# Patient Record
Sex: Female | Born: 1945 | Race: White | Hispanic: No | Smoking: Never smoker
Health system: Southern US, Community
[De-identification: ages and names within clinical notes are randomized; demographics above are authoritative.]

## PROBLEM LIST (undated history)

## (undated) DIAGNOSIS — C50919 Malignant neoplasm of unspecified site of unspecified female breast: Secondary | ICD-10-CM

## (undated) DIAGNOSIS — R112 Nausea with vomiting, unspecified: Secondary | ICD-10-CM

## (undated) DIAGNOSIS — F988 Other specified behavioral and emotional disorders with onset usually occurring in childhood and adolescence: Secondary | ICD-10-CM

## (undated) DIAGNOSIS — R296 Repeated falls: Secondary | ICD-10-CM

## (undated) DIAGNOSIS — K589 Irritable bowel syndrome without diarrhea: Secondary | ICD-10-CM

## (undated) DIAGNOSIS — Z923 Personal history of irradiation: Secondary | ICD-10-CM

## (undated) DIAGNOSIS — Z800 Family history of malignant neoplasm of digestive organs: Secondary | ICD-10-CM

## (undated) DIAGNOSIS — R51 Headache: Secondary | ICD-10-CM

## (undated) DIAGNOSIS — T7840XA Allergy, unspecified, initial encounter: Secondary | ICD-10-CM

## (undated) DIAGNOSIS — R519 Headache, unspecified: Secondary | ICD-10-CM

## (undated) DIAGNOSIS — K311 Adult hypertrophic pyloric stenosis: Secondary | ICD-10-CM

## (undated) DIAGNOSIS — M549 Dorsalgia, unspecified: Secondary | ICD-10-CM

## (undated) DIAGNOSIS — D649 Anemia, unspecified: Secondary | ICD-10-CM

## (undated) DIAGNOSIS — M8580 Other specified disorders of bone density and structure, unspecified site: Secondary | ICD-10-CM

## (undated) DIAGNOSIS — F32A Depression, unspecified: Secondary | ICD-10-CM

## (undated) DIAGNOSIS — R011 Cardiac murmur, unspecified: Secondary | ICD-10-CM

## (undated) DIAGNOSIS — Z9289 Personal history of other medical treatment: Secondary | ICD-10-CM

## (undated) DIAGNOSIS — F329 Major depressive disorder, single episode, unspecified: Secondary | ICD-10-CM

## (undated) DIAGNOSIS — Z8639 Personal history of other endocrine, nutritional and metabolic disease: Secondary | ICD-10-CM

## (undated) DIAGNOSIS — N189 Chronic kidney disease, unspecified: Secondary | ICD-10-CM

## (undated) DIAGNOSIS — C50519 Malignant neoplasm of lower-outer quadrant of unspecified female breast: Secondary | ICD-10-CM

## (undated) DIAGNOSIS — I10 Essential (primary) hypertension: Secondary | ICD-10-CM

## (undated) DIAGNOSIS — N83209 Unspecified ovarian cyst, unspecified side: Secondary | ICD-10-CM

## (undated) DIAGNOSIS — F419 Anxiety disorder, unspecified: Secondary | ICD-10-CM

## (undated) DIAGNOSIS — K5730 Diverticulosis of large intestine without perforation or abscess without bleeding: Secondary | ICD-10-CM

## (undated) DIAGNOSIS — K219 Gastro-esophageal reflux disease without esophagitis: Secondary | ICD-10-CM

## (undated) DIAGNOSIS — IMO0001 Reserved for inherently not codable concepts without codable children: Secondary | ICD-10-CM

## (undated) DIAGNOSIS — E7800 Pure hypercholesterolemia, unspecified: Secondary | ICD-10-CM

## (undated) DIAGNOSIS — Z9889 Other specified postprocedural states: Secondary | ICD-10-CM

## (undated) HISTORY — DX: Gastro-esophageal reflux disease without esophagitis: K21.9

## (undated) HISTORY — PX: WISDOM TOOTH EXTRACTION: SHX21

## (undated) HISTORY — DX: Anemia, unspecified: D64.9

## (undated) HISTORY — DX: Pure hypercholesterolemia, unspecified: E78.00

## (undated) HISTORY — PX: UPPER GASTROINTESTINAL ENDOSCOPY: SHX188

## (undated) HISTORY — DX: Essential (primary) hypertension: I10

## (undated) HISTORY — DX: Other specified behavioral and emotional disorders with onset usually occurring in childhood and adolescence: F98.8

## (undated) HISTORY — DX: Other specified disorders of bone density and structure, unspecified site: M85.80

## (undated) HISTORY — DX: Malignant neoplasm of unspecified site of unspecified female breast: C50.919

## (undated) HISTORY — PX: APPENDECTOMY: SHX54

## (undated) HISTORY — PX: HERNIA REPAIR: SHX51

## (undated) HISTORY — DX: Diverticulosis of large intestine without perforation or abscess without bleeding: K57.30

## (undated) HISTORY — DX: Depression, unspecified: F32.A

## (undated) HISTORY — DX: Anxiety disorder, unspecified: F41.9

## (undated) HISTORY — DX: Dorsalgia, unspecified: M54.9

## (undated) HISTORY — PX: CATARACT EXTRACTION: SUR2

## (undated) HISTORY — DX: Irritable bowel syndrome, unspecified: K58.9

## (undated) HISTORY — PX: COLONOSCOPY: SHX174

## (undated) HISTORY — DX: Malignant neoplasm of lower-outer quadrant of unspecified female breast: C50.519

## (undated) HISTORY — PX: UTERINE FIBROID SURGERY: SHX826

## (undated) HISTORY — PX: ESOPHAGOGASTRODUODENOSCOPY: SHX1529

## (undated) HISTORY — DX: Major depressive disorder, single episode, unspecified: F32.9

## (undated) HISTORY — DX: Allergy, unspecified, initial encounter: T78.40XA

## (undated) HISTORY — DX: Family history of malignant neoplasm of digestive organs: Z80.0

---

## 2004-12-07 ENCOUNTER — Ambulatory Visit: Payer: Self-pay | Admitting: Gastroenterology

## 2005-04-26 ENCOUNTER — Ambulatory Visit: Admission: RE | Admit: 2005-04-26 | Discharge: 2005-04-26 | Payer: Self-pay | Admitting: Family Medicine

## 2005-04-26 ENCOUNTER — Encounter: Payer: Self-pay | Admitting: Cardiology

## 2005-04-26 ENCOUNTER — Ambulatory Visit: Payer: Self-pay | Admitting: Cardiology

## 2005-11-25 DIAGNOSIS — C50919 Malignant neoplasm of unspecified site of unspecified female breast: Secondary | ICD-10-CM

## 2005-11-25 HISTORY — PX: OTHER SURGICAL HISTORY: SHX169

## 2005-11-25 HISTORY — DX: Malignant neoplasm of unspecified site of unspecified female breast: C50.919

## 2005-11-25 HISTORY — PX: BREAST LUMPECTOMY: SHX2

## 2006-01-31 ENCOUNTER — Encounter (INDEPENDENT_AMBULATORY_CARE_PROVIDER_SITE_OTHER): Payer: Self-pay | Admitting: *Deleted

## 2006-01-31 ENCOUNTER — Inpatient Hospital Stay (HOSPITAL_COMMUNITY): Admission: EM | Admit: 2006-01-31 | Discharge: 2006-02-13 | Payer: Self-pay | Admitting: Emergency Medicine

## 2006-04-07 ENCOUNTER — Encounter: Admission: RE | Admit: 2006-04-07 | Discharge: 2006-04-07 | Payer: Self-pay | Admitting: Surgery

## 2006-10-21 ENCOUNTER — Encounter: Admission: RE | Admit: 2006-10-21 | Discharge: 2006-10-21 | Payer: Self-pay | Admitting: Obstetrics and Gynecology

## 2006-10-21 ENCOUNTER — Encounter (INDEPENDENT_AMBULATORY_CARE_PROVIDER_SITE_OTHER): Payer: Self-pay | Admitting: Radiology

## 2006-10-31 ENCOUNTER — Encounter: Admission: RE | Admit: 2006-10-31 | Discharge: 2006-10-31 | Payer: Self-pay | Admitting: Obstetrics and Gynecology

## 2006-11-03 ENCOUNTER — Encounter: Admission: RE | Admit: 2006-11-03 | Discharge: 2006-11-03 | Payer: Self-pay | Admitting: General Surgery

## 2006-11-04 ENCOUNTER — Ambulatory Visit (HOSPITAL_BASED_OUTPATIENT_CLINIC_OR_DEPARTMENT_OTHER): Admission: RE | Admit: 2006-11-04 | Discharge: 2006-11-04 | Payer: Self-pay | Admitting: General Surgery

## 2006-11-04 ENCOUNTER — Encounter (INDEPENDENT_AMBULATORY_CARE_PROVIDER_SITE_OTHER): Payer: Self-pay | Admitting: Specialist

## 2006-11-04 ENCOUNTER — Encounter: Admission: RE | Admit: 2006-11-04 | Discharge: 2006-11-04 | Payer: Self-pay | Admitting: General Surgery

## 2006-11-05 ENCOUNTER — Ambulatory Visit: Payer: Self-pay | Admitting: Oncology

## 2006-11-25 DIAGNOSIS — Z923 Personal history of irradiation: Secondary | ICD-10-CM

## 2006-11-25 HISTORY — DX: Personal history of irradiation: Z92.3

## 2006-11-26 LAB — CANCER ANTIGEN 27.29: CA 27.29: 36 U/mL (ref 0–39)

## 2006-11-26 LAB — CBC WITH DIFFERENTIAL/PLATELET
BASO%: 1 % (ref 0.0–2.0)
Basophils Absolute: 0.1 10*3/uL (ref 0.0–0.1)
EOS%: 6.6 % (ref 0.0–7.0)
Eosinophils Absolute: 0.6 10*3/uL — ABNORMAL HIGH (ref 0.0–0.5)
HCT: 34.5 % — ABNORMAL LOW (ref 34.8–46.6)
HGB: 11.7 g/dL (ref 11.6–15.9)
LYMPH%: 25.8 % (ref 14.0–48.0)
MCH: 29.5 pg (ref 26.0–34.0)
MCHC: 33.9 g/dL (ref 32.0–36.0)
MCV: 87 fL (ref 81.0–101.0)
MONO#: 0.7 10*3/uL (ref 0.1–0.9)
MONO%: 7.5 % (ref 0.0–13.0)
NEUT#: 5.3 10*3/uL (ref 1.5–6.5)
NEUT%: 59.1 % (ref 39.6–76.8)
Platelets: 305 10*3/uL (ref 145–400)
RBC: 3.96 10*6/uL (ref 3.70–5.32)
RDW: 12.5 % (ref 11.3–14.5)
WBC: 9 10*3/uL (ref 3.9–10.0)
lymph#: 2.3 10*3/uL (ref 0.9–3.3)

## 2006-11-26 LAB — COMPREHENSIVE METABOLIC PANEL
ALT: 17 U/L (ref 0–35)
AST: 14 U/L (ref 0–37)
Albumin: 4.8 g/dL (ref 3.5–5.2)
Alkaline Phosphatase: 109 U/L (ref 39–117)
BUN: 32 mg/dL — ABNORMAL HIGH (ref 6–23)
CO2: 26 mEq/L (ref 19–32)
Calcium: 9.9 mg/dL (ref 8.4–10.5)
Chloride: 100 mEq/L (ref 96–112)
Creatinine, Ser: 1.1 mg/dL (ref 0.40–1.20)
Glucose, Bld: 102 mg/dL — ABNORMAL HIGH (ref 70–99)
Potassium: 4.6 mEq/L (ref 3.5–5.3)
Sodium: 138 mEq/L (ref 135–145)
Total Bilirubin: 0.2 mg/dL — ABNORMAL LOW (ref 0.3–1.2)
Total Protein: 7.2 g/dL (ref 6.0–8.3)

## 2006-11-26 LAB — LACTATE DEHYDROGENASE: LDH: 163 U/L (ref 94–250)

## 2006-12-03 ENCOUNTER — Ambulatory Visit (HOSPITAL_COMMUNITY): Admission: RE | Admit: 2006-12-03 | Discharge: 2006-12-03 | Payer: Self-pay | Admitting: Oncology

## 2006-12-08 ENCOUNTER — Encounter: Admission: RE | Admit: 2006-12-08 | Discharge: 2006-12-08 | Payer: Self-pay | Admitting: Oncology

## 2006-12-15 ENCOUNTER — Ambulatory Visit: Payer: Self-pay | Admitting: Oncology

## 2006-12-15 LAB — RESEARCH LABS

## 2006-12-19 ENCOUNTER — Ambulatory Visit: Admission: RE | Admit: 2006-12-19 | Discharge: 2007-03-03 | Payer: Self-pay | Admitting: Radiation Oncology

## 2007-01-05 ENCOUNTER — Other Ambulatory Visit: Admission: RE | Admit: 2007-01-05 | Discharge: 2007-01-05 | Payer: Self-pay | Admitting: Family Medicine

## 2007-02-18 ENCOUNTER — Ambulatory Visit: Payer: Self-pay | Admitting: Oncology

## 2007-02-18 LAB — CBC WITH DIFFERENTIAL/PLATELET
BASO%: 0.9 % (ref 0.0–2.0)
Basophils Absolute: 0.1 10*3/uL (ref 0.0–0.1)
EOS%: 1.8 % (ref 0.0–7.0)
Eosinophils Absolute: 0.1 10*3/uL (ref 0.0–0.5)
HCT: 33.5 % — ABNORMAL LOW (ref 34.8–46.6)
HGB: 11.5 g/dL — ABNORMAL LOW (ref 11.6–15.9)
LYMPH%: 16.9 % (ref 14.0–48.0)
MCH: 30.1 pg (ref 26.0–34.0)
MCHC: 34.4 g/dL (ref 32.0–36.0)
MCV: 87.7 fL (ref 81.0–101.0)
MONO#: 0.5 10*3/uL (ref 0.1–0.9)
MONO%: 7.3 % (ref 0.0–13.0)
NEUT#: 5.5 10*3/uL (ref 1.5–6.5)
NEUT%: 73.1 % (ref 39.6–76.8)
Platelets: 310 10*3/uL (ref 145–400)
RBC: 3.82 10*6/uL (ref 3.70–5.32)
RDW: 12.6 % (ref 11.3–14.5)
WBC: 7.5 10*3/uL (ref 3.9–10.0)
lymph#: 1.3 10*3/uL (ref 0.9–3.3)

## 2007-02-18 LAB — COMPREHENSIVE METABOLIC PANEL
ALT: 19 U/L (ref 0–35)
AST: 19 U/L (ref 0–37)
Albumin: 4.6 g/dL (ref 3.5–5.2)
Alkaline Phosphatase: 123 U/L — ABNORMAL HIGH (ref 39–117)
BUN: 22 mg/dL (ref 6–23)
CO2: 27 mEq/L (ref 19–32)
Calcium: 9.5 mg/dL (ref 8.4–10.5)
Chloride: 102 mEq/L (ref 96–112)
Creatinine, Ser: 0.85 mg/dL (ref 0.40–1.20)
Glucose, Bld: 99 mg/dL (ref 70–99)
Potassium: 4.4 mEq/L (ref 3.5–5.3)
Sodium: 140 mEq/L (ref 135–145)
Total Bilirubin: 0.3 mg/dL (ref 0.3–1.2)
Total Protein: 7.4 g/dL (ref 6.0–8.3)

## 2007-02-18 LAB — LACTATE DEHYDROGENASE: LDH: 155 U/L (ref 94–250)

## 2007-04-13 ENCOUNTER — Ambulatory Visit: Payer: Self-pay | Admitting: Oncology

## 2007-04-15 LAB — COMPREHENSIVE METABOLIC PANEL
ALT: 20 U/L (ref 0–35)
AST: 21 U/L (ref 0–37)
Albumin: 4.9 g/dL (ref 3.5–5.2)
Alkaline Phosphatase: 116 U/L (ref 39–117)
BUN: 21 mg/dL (ref 6–23)
CO2: 24 mEq/L (ref 19–32)
Calcium: 10.3 mg/dL (ref 8.4–10.5)
Chloride: 102 mEq/L (ref 96–112)
Creatinine, Ser: 0.98 mg/dL (ref 0.40–1.20)
Glucose, Bld: 119 mg/dL — ABNORMAL HIGH (ref 70–99)
Potassium: 3.9 mEq/L (ref 3.5–5.3)
Sodium: 140 mEq/L (ref 135–145)
Total Bilirubin: 0.3 mg/dL (ref 0.3–1.2)
Total Protein: 7.8 g/dL (ref 6.0–8.3)

## 2007-04-15 LAB — CBC WITH DIFFERENTIAL/PLATELET
BASO%: 0.2 % (ref 0.0–2.0)
Basophils Absolute: 0 10*3/uL (ref 0.0–0.1)
EOS%: 1.3 % (ref 0.0–7.0)
Eosinophils Absolute: 0.1 10*3/uL (ref 0.0–0.5)
HCT: 34.7 % — ABNORMAL LOW (ref 34.8–46.6)
HGB: 12.2 g/dL (ref 11.6–15.9)
LYMPH%: 14.8 % (ref 14.0–48.0)
MCH: 30.5 pg (ref 26.0–34.0)
MCHC: 35.2 g/dL (ref 32.0–36.0)
MCV: 86.8 fL (ref 81.0–101.0)
MONO#: 0.6 10*3/uL (ref 0.1–0.9)
MONO%: 5.6 % (ref 0.0–13.0)
NEUT#: 8 10*3/uL — ABNORMAL HIGH (ref 1.5–6.5)
NEUT%: 78.1 % — ABNORMAL HIGH (ref 39.6–76.8)
Platelets: 285 10*3/uL (ref 145–400)
RBC: 4 10*6/uL (ref 3.70–5.32)
RDW: 13.2 % (ref 11.3–14.5)
WBC: 10.2 10*3/uL — ABNORMAL HIGH (ref 3.9–10.0)
lymph#: 1.5 10*3/uL (ref 0.9–3.3)

## 2007-04-15 LAB — LACTATE DEHYDROGENASE: LDH: 153 U/L (ref 94–250)

## 2007-05-18 ENCOUNTER — Encounter: Admission: RE | Admit: 2007-05-18 | Discharge: 2007-05-18 | Payer: Self-pay | Admitting: General Surgery

## 2007-08-01 ENCOUNTER — Inpatient Hospital Stay (HOSPITAL_COMMUNITY): Admission: RE | Admit: 2007-08-01 | Discharge: 2007-08-02 | Payer: Self-pay | Admitting: Surgery

## 2007-08-03 ENCOUNTER — Ambulatory Visit: Payer: Self-pay | Admitting: Oncology

## 2007-08-07 LAB — CBC WITH DIFFERENTIAL/PLATELET
BASO%: 0.4 % (ref 0.0–2.0)
Basophils Absolute: 0 10*3/uL (ref 0.0–0.1)
EOS%: 5.5 % (ref 0.0–7.0)
Eosinophils Absolute: 0.5 10*3/uL (ref 0.0–0.5)
HCT: 30.4 % — ABNORMAL LOW (ref 34.8–46.6)
HGB: 10.7 g/dL — ABNORMAL LOW (ref 11.6–15.9)
LYMPH%: 14.3 % (ref 14.0–48.0)
MCH: 30.4 pg (ref 26.0–34.0)
MCHC: 35.3 g/dL (ref 32.0–36.0)
MCV: 86.1 fL (ref 81.0–101.0)
MONO#: 0.6 10*3/uL (ref 0.1–0.9)
MONO%: 6.2 % (ref 0.0–13.0)
NEUT#: 6.8 10*3/uL — ABNORMAL HIGH (ref 1.5–6.5)
NEUT%: 73.6 % (ref 39.6–76.8)
Platelets: 314 10*3/uL (ref 145–400)
RBC: 3.53 10*6/uL — ABNORMAL LOW (ref 3.70–5.32)
RDW: 12.8 % (ref 11.3–14.5)
WBC: 9.2 10*3/uL (ref 3.9–10.0)
lymph#: 1.3 10*3/uL (ref 0.9–3.3)

## 2007-08-07 LAB — COMPREHENSIVE METABOLIC PANEL
ALT: 22 U/L (ref 0–35)
AST: 20 U/L (ref 0–37)
Albumin: 4.1 g/dL (ref 3.5–5.2)
Alkaline Phosphatase: 113 U/L (ref 39–117)
BUN: 17 mg/dL (ref 6–23)
CO2: 22 mEq/L (ref 19–32)
Calcium: 9.3 mg/dL (ref 8.4–10.5)
Chloride: 101 mEq/L (ref 96–112)
Creatinine, Ser: 0.84 mg/dL (ref 0.40–1.20)
Glucose, Bld: 130 mg/dL — ABNORMAL HIGH (ref 70–99)
Potassium: 4 mEq/L (ref 3.5–5.3)
Sodium: 138 mEq/L (ref 135–145)
Total Bilirubin: 0.2 mg/dL — ABNORMAL LOW (ref 0.3–1.2)
Total Protein: 7 g/dL (ref 6.0–8.3)

## 2007-08-07 LAB — CANCER ANTIGEN 27.29: CA 27.29: 30 U/mL (ref 0–39)

## 2007-08-07 LAB — LACTATE DEHYDROGENASE: LDH: 178 U/L (ref 94–250)

## 2007-09-22 ENCOUNTER — Ambulatory Visit: Payer: Self-pay | Admitting: Oncology

## 2007-09-24 LAB — CBC & DIFF AND RETIC
BASO%: 0.3 % (ref 0.0–2.0)
Basophils Absolute: 0 10*3/uL (ref 0.0–0.1)
EOS%: 3 % (ref 0.0–7.0)
Eosinophils Absolute: 0.2 10*3/uL (ref 0.0–0.5)
HCT: 35.2 % (ref 34.8–46.6)
HGB: 12.5 g/dL (ref 11.6–15.9)
IRF: 0.4 — ABNORMAL HIGH (ref 0.130–0.330)
LYMPH%: 25 % (ref 14.0–48.0)
MCH: 30.3 pg (ref 26.0–34.0)
MCHC: 35.7 g/dL (ref 32.0–36.0)
MCV: 84.8 fL (ref 81.0–101.0)
MONO#: 0.4 10*3/uL (ref 0.1–0.9)
MONO%: 6.2 % (ref 0.0–13.0)
NEUT#: 4.6 10*3/uL (ref 1.5–6.5)
NEUT%: 65.5 % (ref 39.6–76.8)
Platelets: 283 10*3/uL (ref 145–400)
RBC: 4.15 10*6/uL (ref 3.70–5.32)
RDW: 13.4 % (ref 11.3–14.5)
RETIC #: 61.8 10*3/uL (ref 19.7–115.1)
Retic %: 1.5 % (ref 0.4–2.3)
WBC: 7 10*3/uL (ref 3.9–10.0)
lymph#: 1.8 10*3/uL (ref 0.9–3.3)

## 2007-09-24 LAB — COMPREHENSIVE METABOLIC PANEL
ALT: 17 U/L (ref 0–35)
AST: 19 U/L (ref 0–37)
Albumin: 4.7 g/dL (ref 3.5–5.2)
Alkaline Phosphatase: 100 U/L (ref 39–117)
BUN: 17 mg/dL (ref 6–23)
CO2: 21 mEq/L (ref 19–32)
Calcium: 9.6 mg/dL (ref 8.4–10.5)
Chloride: 102 mEq/L (ref 96–112)
Creatinine, Ser: 0.93 mg/dL (ref 0.40–1.20)
Glucose, Bld: 118 mg/dL — ABNORMAL HIGH (ref 70–99)
Potassium: 3.4 mEq/L — ABNORMAL LOW (ref 3.5–5.3)
Sodium: 139 mEq/L (ref 135–145)
Total Bilirubin: 0.3 mg/dL (ref 0.3–1.2)
Total Protein: 7.3 g/dL (ref 6.0–8.3)

## 2007-09-24 LAB — FERRITIN: Ferritin: 71 ng/mL (ref 10–291)

## 2007-09-24 LAB — IRON AND TIBC
%SAT: 19 % — ABNORMAL LOW (ref 20–55)
Iron: 60 ug/dL (ref 42–145)
TIBC: 321 ug/dL (ref 250–470)
UIBC: 261 ug/dL

## 2007-09-24 LAB — MORPHOLOGY
PLT EST: ADEQUATE
RBC Comments: NORMAL

## 2007-09-24 LAB — LACTATE DEHYDROGENASE: LDH: 154 U/L (ref 94–250)

## 2007-09-24 LAB — CHCC SMEAR

## 2007-09-24 LAB — CANCER ANTIGEN 27.29: CA 27.29: 36 U/mL (ref 0–39)

## 2007-10-15 ENCOUNTER — Encounter: Admission: RE | Admit: 2007-10-15 | Discharge: 2007-10-15 | Payer: Self-pay | Admitting: Oncology

## 2007-12-30 ENCOUNTER — Ambulatory Visit: Payer: Self-pay | Admitting: Oncology

## 2008-01-04 LAB — CBC & DIFF AND RETIC
BASO%: 0.3 % (ref 0.0–2.0)
Basophils Absolute: 0 10*3/uL (ref 0.0–0.1)
EOS%: 2.1 % (ref 0.0–7.0)
Eosinophils Absolute: 0.2 10*3/uL (ref 0.0–0.5)
HCT: 38.3 % (ref 34.8–46.6)
HGB: 12.5 g/dL (ref 11.6–15.9)
IRF: 0.43 — ABNORMAL HIGH (ref 0.130–0.330)
LYMPH%: 23.2 % (ref 14.0–48.0)
MCH: 28.5 pg (ref 26.0–34.0)
MCHC: 32.7 g/dL (ref 32.0–36.0)
MCV: 87.1 fL (ref 81.0–101.0)
MONO#: 0.5 10*3/uL (ref 0.1–0.9)
MONO%: 6 % (ref 0.0–13.0)
NEUT#: 5.2 10*3/uL (ref 1.5–6.5)
NEUT%: 68.4 % (ref 39.6–76.8)
Platelets: 298 10*3/uL (ref 145–400)
RBC: 4.4 10*6/uL (ref 3.70–5.32)
RDW: 12.7 % (ref 11.3–14.5)
RETIC #: 72.2 10*3/uL (ref 19.7–115.1)
Retic %: 1.6 % (ref 0.4–2.3)
WBC: 7.5 10*3/uL (ref 3.9–10.0)
lymph#: 1.7 10*3/uL (ref 0.9–3.3)

## 2008-01-04 LAB — MORPHOLOGY
PLT EST: ADEQUATE
RBC Comments: NORMAL

## 2008-01-04 LAB — CHCC SMEAR

## 2008-01-05 LAB — COMPREHENSIVE METABOLIC PANEL
ALT: 20 U/L (ref 0–35)
AST: 20 U/L (ref 0–37)
Albumin: 4.9 g/dL (ref 3.5–5.2)
Alkaline Phosphatase: 94 U/L (ref 39–117)
BUN: 19 mg/dL (ref 6–23)
CO2: 25 mEq/L (ref 19–32)
Calcium: 9.8 mg/dL (ref 8.4–10.5)
Chloride: 101 mEq/L (ref 96–112)
Creatinine, Ser: 1.21 mg/dL — ABNORMAL HIGH (ref 0.40–1.20)
Glucose, Bld: 100 mg/dL — ABNORMAL HIGH (ref 70–99)
Potassium: 4 mEq/L (ref 3.5–5.3)
Sodium: 140 mEq/L (ref 135–145)
Total Bilirubin: 0.3 mg/dL (ref 0.3–1.2)
Total Protein: 7.5 g/dL (ref 6.0–8.3)

## 2008-01-05 LAB — IRON AND TIBC
%SAT: 18 % — ABNORMAL LOW (ref 20–55)
Iron: 58 ug/dL (ref 42–145)
TIBC: 325 ug/dL (ref 250–470)
UIBC: 267 ug/dL

## 2008-01-05 LAB — FERRITIN: Ferritin: 50 ng/mL (ref 10–291)

## 2008-01-05 LAB — LACTATE DEHYDROGENASE: LDH: 162 U/L (ref 94–250)

## 2008-01-05 LAB — CANCER ANTIGEN 27.29: CA 27.29: 25 U/mL (ref 0–39)

## 2008-01-07 ENCOUNTER — Other Ambulatory Visit: Admission: RE | Admit: 2008-01-07 | Discharge: 2008-01-07 | Payer: Self-pay | Admitting: Family Medicine

## 2008-07-04 ENCOUNTER — Encounter: Admission: RE | Admit: 2008-07-04 | Discharge: 2008-07-04 | Payer: Self-pay | Admitting: Oncology

## 2008-07-22 ENCOUNTER — Ambulatory Visit: Payer: Self-pay | Admitting: Oncology

## 2008-07-26 LAB — CBC WITH DIFFERENTIAL/PLATELET
BASO%: 0.5 % (ref 0.0–2.0)
Basophils Absolute: 0 10*3/uL (ref 0.0–0.1)
EOS%: 2.6 % (ref 0.0–7.0)
Eosinophils Absolute: 0.2 10*3/uL (ref 0.0–0.5)
HCT: 33.8 % — ABNORMAL LOW (ref 34.8–46.6)
HGB: 11.6 g/dL (ref 11.6–15.9)
LYMPH%: 28.5 % (ref 14.0–48.0)
MCH: 30.2 pg (ref 26.0–34.0)
MCHC: 34.3 g/dL (ref 32.0–36.0)
MCV: 88 fL (ref 81.0–101.0)
MONO#: 0.4 10*3/uL (ref 0.1–0.9)
MONO%: 7.2 % (ref 0.0–13.0)
NEUT#: 3.6 10*3/uL (ref 1.5–6.5)
NEUT%: 61.2 % (ref 39.6–76.8)
Platelets: 275 10*3/uL (ref 145–400)
RBC: 3.84 10*6/uL (ref 3.70–5.32)
RDW: 13.1 % (ref 11.3–14.5)
WBC: 5.9 10*3/uL (ref 3.9–10.0)
lymph#: 1.7 10*3/uL (ref 0.9–3.3)

## 2008-07-27 LAB — COMPREHENSIVE METABOLIC PANEL
ALT: 18 U/L (ref 0–35)
AST: 18 U/L (ref 0–37)
Albumin: 4.5 g/dL (ref 3.5–5.2)
Alkaline Phosphatase: 73 U/L (ref 39–117)
BUN: 23 mg/dL (ref 6–23)
CO2: 23 mEq/L (ref 19–32)
Calcium: 9.8 mg/dL (ref 8.4–10.5)
Chloride: 101 mEq/L (ref 96–112)
Creatinine, Ser: 0.86 mg/dL (ref 0.40–1.20)
Glucose, Bld: 120 mg/dL — ABNORMAL HIGH (ref 70–99)
Potassium: 4.2 mEq/L (ref 3.5–5.3)
Sodium: 137 mEq/L (ref 135–145)
Total Bilirubin: 0.3 mg/dL (ref 0.3–1.2)
Total Protein: 7.3 g/dL (ref 6.0–8.3)

## 2008-07-27 LAB — CANCER ANTIGEN 27.29: CA 27.29: 40 U/mL — ABNORMAL HIGH (ref 0–39)

## 2008-07-27 LAB — VITAMIN D 25 HYDROXY (VIT D DEFICIENCY, FRACTURES): Vit D, 25-Hydroxy: 27 ng/mL — ABNORMAL LOW (ref 30–89)

## 2008-07-27 LAB — LACTATE DEHYDROGENASE: LDH: 150 U/L (ref 94–250)

## 2008-09-09 ENCOUNTER — Ambulatory Visit: Payer: Self-pay | Admitting: Oncology

## 2008-09-15 LAB — CANCER ANTIGEN 27.29: CA 27.29: 29 U/mL (ref 0–39)

## 2008-10-17 ENCOUNTER — Encounter: Admission: RE | Admit: 2008-10-17 | Discharge: 2008-10-17 | Payer: Self-pay | Admitting: Oncology

## 2008-11-01 ENCOUNTER — Encounter: Admission: RE | Admit: 2008-11-01 | Discharge: 2008-11-01 | Payer: Self-pay | Admitting: Oncology

## 2009-01-23 ENCOUNTER — Ambulatory Visit: Payer: Self-pay | Admitting: Oncology

## 2009-01-25 LAB — CBC WITH DIFFERENTIAL/PLATELET
BASO%: 0.3 % (ref 0.0–2.0)
Basophils Absolute: 0 10*3/uL (ref 0.0–0.1)
EOS%: 1.8 % (ref 0.0–7.0)
Eosinophils Absolute: 0.2 10*3/uL (ref 0.0–0.5)
HCT: 36.9 % (ref 34.8–46.6)
HGB: 12.7 g/dL (ref 11.6–15.9)
LYMPH%: 27.1 % (ref 14.0–49.7)
MCH: 30.6 pg (ref 25.1–34.0)
MCHC: 34.4 g/dL (ref 31.5–36.0)
MCV: 88.8 fL (ref 79.5–101.0)
MONO#: 0.5 10*3/uL (ref 0.1–0.9)
MONO%: 5.7 % (ref 0.0–14.0)
NEUT#: 5.8 10*3/uL (ref 1.5–6.5)
NEUT%: 65.1 % (ref 38.4–76.8)
Platelets: 333 10*3/uL (ref 145–400)
RBC: 4.15 10*6/uL (ref 3.70–5.45)
RDW: 12.6 % (ref 11.2–14.5)
WBC: 8.9 10*3/uL (ref 3.9–10.3)
lymph#: 2.4 10*3/uL (ref 0.9–3.3)

## 2009-01-26 LAB — COMPREHENSIVE METABOLIC PANEL
ALT: 16 U/L (ref 0–35)
AST: 16 U/L (ref 0–37)
Albumin: 4.8 g/dL (ref 3.5–5.2)
Alkaline Phosphatase: 75 U/L (ref 39–117)
BUN: 29 mg/dL — ABNORMAL HIGH (ref 6–23)
CO2: 18 mEq/L — ABNORMAL LOW (ref 19–32)
Calcium: 9.9 mg/dL (ref 8.4–10.5)
Chloride: 105 mEq/L (ref 96–112)
Creatinine, Ser: 1.1 mg/dL (ref 0.40–1.20)
Glucose, Bld: 114 mg/dL — ABNORMAL HIGH (ref 70–99)
Potassium: 4.1 mEq/L (ref 3.5–5.3)
Sodium: 138 mEq/L (ref 135–145)
Total Bilirubin: 0.4 mg/dL (ref 0.3–1.2)
Total Protein: 7.7 g/dL (ref 6.0–8.3)

## 2009-01-26 LAB — VITAMIN D 25 HYDROXY (VIT D DEFICIENCY, FRACTURES): Vit D, 25-Hydroxy: 40 ng/mL (ref 30–89)

## 2009-01-26 LAB — CANCER ANTIGEN 27.29: CA 27.29: 31 U/mL (ref 0–39)

## 2009-01-26 LAB — LACTATE DEHYDROGENASE: LDH: 155 U/L (ref 94–250)

## 2009-05-15 ENCOUNTER — Other Ambulatory Visit: Admission: RE | Admit: 2009-05-15 | Discharge: 2009-05-15 | Payer: Self-pay | Admitting: Family Medicine

## 2009-07-28 ENCOUNTER — Ambulatory Visit: Payer: Self-pay | Admitting: Oncology

## 2009-08-02 LAB — COMPREHENSIVE METABOLIC PANEL
ALT: 21 U/L (ref 0–35)
AST: 23 U/L (ref 0–37)
Albumin: 4.2 g/dL (ref 3.5–5.2)
Alkaline Phosphatase: 60 U/L (ref 39–117)
BUN: 26 mg/dL — ABNORMAL HIGH (ref 6–23)
CO2: 24 mEq/L (ref 19–32)
Calcium: 9.8 mg/dL (ref 8.4–10.5)
Chloride: 106 mEq/L (ref 96–112)
Creatinine, Ser: 1.3 mg/dL — ABNORMAL HIGH (ref 0.40–1.20)
Glucose, Bld: 100 mg/dL — ABNORMAL HIGH (ref 70–99)
Potassium: 2.9 mEq/L — ABNORMAL LOW (ref 3.5–5.3)
Sodium: 139 mEq/L (ref 135–145)
Total Bilirubin: 0.2 mg/dL — ABNORMAL LOW (ref 0.3–1.2)
Total Protein: 7.4 g/dL (ref 6.0–8.3)

## 2009-08-02 LAB — CBC WITH DIFFERENTIAL/PLATELET
BASO%: 0.3 % (ref 0.0–2.0)
Basophils Absolute: 0 10*3/uL (ref 0.0–0.1)
EOS%: 2.3 % (ref 0.0–7.0)
Eosinophils Absolute: 0.2 10*3/uL (ref 0.0–0.5)
HCT: 31.7 % — ABNORMAL LOW (ref 34.8–46.6)
HGB: 11 g/dL — ABNORMAL LOW (ref 11.6–15.9)
LYMPH%: 21.8 % (ref 14.0–49.7)
MCH: 30.9 pg (ref 25.1–34.0)
MCHC: 34.6 g/dL (ref 31.5–36.0)
MCV: 89.2 fL (ref 79.5–101.0)
MONO#: 0.6 10*3/uL (ref 0.1–0.9)
MONO%: 6.4 % (ref 0.0–14.0)
NEUT#: 6.6 10*3/uL — ABNORMAL HIGH (ref 1.5–6.5)
NEUT%: 69.2 % (ref 38.4–76.8)
Platelets: 285 10*3/uL (ref 145–400)
RBC: 3.55 10*6/uL — ABNORMAL LOW (ref 3.70–5.45)
RDW: 13 % (ref 11.2–14.5)
WBC: 9.5 10*3/uL (ref 3.9–10.3)
lymph#: 2.1 10*3/uL (ref 0.9–3.3)

## 2009-08-02 LAB — LACTATE DEHYDROGENASE: LDH: 150 U/L (ref 94–250)

## 2009-08-03 LAB — VITAMIN D 25 HYDROXY (VIT D DEFICIENCY, FRACTURES): Vit D, 25-Hydroxy: 56 ng/mL (ref 30–89)

## 2009-08-03 LAB — CANCER ANTIGEN 27.29: CA 27.29: 30 U/mL (ref 0–39)

## 2009-08-28 ENCOUNTER — Ambulatory Visit: Payer: Self-pay | Admitting: Gastroenterology

## 2009-09-06 ENCOUNTER — Ambulatory Visit: Payer: Self-pay | Admitting: Gastroenterology

## 2009-09-06 ENCOUNTER — Ambulatory Visit (HOSPITAL_COMMUNITY): Admission: RE | Admit: 2009-09-06 | Discharge: 2009-09-06 | Payer: Self-pay | Admitting: Gastroenterology

## 2009-10-24 ENCOUNTER — Encounter: Admission: RE | Admit: 2009-10-24 | Discharge: 2009-10-24 | Payer: Self-pay | Admitting: Oncology

## 2010-01-30 ENCOUNTER — Ambulatory Visit: Payer: Self-pay | Admitting: Oncology

## 2010-02-01 LAB — CBC WITH DIFFERENTIAL/PLATELET
BASO%: 0.4 % (ref 0.0–2.0)
Basophils Absolute: 0 10*3/uL (ref 0.0–0.1)
EOS%: 2 % (ref 0.0–7.0)
Eosinophils Absolute: 0.2 10*3/uL (ref 0.0–0.5)
HCT: 32.5 % — ABNORMAL LOW (ref 34.8–46.6)
HGB: 11.3 g/dL — ABNORMAL LOW (ref 11.6–15.9)
LYMPH%: 28.2 % (ref 14.0–49.7)
MCH: 31.3 pg (ref 25.1–34.0)
MCHC: 34.8 g/dL (ref 31.5–36.0)
MCV: 90.1 fL (ref 79.5–101.0)
MONO#: 0.7 10*3/uL (ref 0.1–0.9)
MONO%: 8.3 % (ref 0.0–14.0)
NEUT#: 5.3 10*3/uL (ref 1.5–6.5)
NEUT%: 61.1 % (ref 38.4–76.8)
Platelets: 291 10*3/uL (ref 145–400)
RBC: 3.61 10*6/uL — ABNORMAL LOW (ref 3.70–5.45)
RDW: 12.8 % (ref 11.2–14.5)
WBC: 8.6 10*3/uL (ref 3.9–10.3)
lymph#: 2.4 10*3/uL (ref 0.9–3.3)

## 2010-02-01 LAB — VITAMIN D 25 HYDROXY (VIT D DEFICIENCY, FRACTURES): Vit D, 25-Hydroxy: 48 ng/mL (ref 30–89)

## 2010-02-01 LAB — COMPREHENSIVE METABOLIC PANEL
ALT: 15 U/L (ref 0–35)
AST: 16 U/L (ref 0–37)
Albumin: 4.6 g/dL (ref 3.5–5.2)
Alkaline Phosphatase: 69 U/L (ref 39–117)
BUN: 23 mg/dL (ref 6–23)
CO2: 22 mEq/L (ref 19–32)
Calcium: 9.7 mg/dL (ref 8.4–10.5)
Chloride: 106 mEq/L (ref 96–112)
Creatinine, Ser: 1.05 mg/dL (ref 0.40–1.20)
Glucose, Bld: 69 mg/dL — ABNORMAL LOW (ref 70–99)
Potassium: 3.7 mEq/L (ref 3.5–5.3)
Sodium: 140 mEq/L (ref 135–145)
Total Bilirubin: 0.2 mg/dL — ABNORMAL LOW (ref 0.3–1.2)
Total Protein: 7.2 g/dL (ref 6.0–8.3)

## 2010-02-01 LAB — LACTATE DEHYDROGENASE: LDH: 166 U/L (ref 94–250)

## 2010-02-01 LAB — CANCER ANTIGEN 27.29: CA 27.29: 24 U/mL (ref 0–39)

## 2010-08-01 ENCOUNTER — Other Ambulatory Visit: Admission: RE | Admit: 2010-08-01 | Discharge: 2010-08-01 | Payer: Self-pay | Admitting: Family Medicine

## 2010-10-03 ENCOUNTER — Ambulatory Visit: Payer: Self-pay | Admitting: Oncology

## 2010-10-05 LAB — CBC WITH DIFFERENTIAL/PLATELET
BASO%: 0.3 % (ref 0.0–2.0)
Basophils Absolute: 0 10*3/uL (ref 0.0–0.1)
EOS%: 1.8 % (ref 0.0–7.0)
Eosinophils Absolute: 0.2 10*3/uL (ref 0.0–0.5)
HCT: 33.8 % — ABNORMAL LOW (ref 34.8–46.6)
HGB: 11.6 g/dL (ref 11.6–15.9)
LYMPH%: 17.5 % (ref 14.0–49.7)
MCH: 30 pg (ref 25.1–34.0)
MCHC: 34.3 g/dL (ref 31.5–36.0)
MCV: 87.5 fL (ref 79.5–101.0)
MONO#: 0.7 10*3/uL (ref 0.1–0.9)
MONO%: 5.5 % (ref 0.0–14.0)
NEUT#: 9.3 10*3/uL — ABNORMAL HIGH (ref 1.5–6.5)
NEUT%: 74.9 % (ref 38.4–76.8)
Platelets: 301 10*3/uL (ref 145–400)
RBC: 3.86 10*6/uL (ref 3.70–5.45)
RDW: 13.4 % (ref 11.2–14.5)
WBC: 12.5 10*3/uL — ABNORMAL HIGH (ref 3.9–10.3)
lymph#: 2.2 10*3/uL (ref 0.9–3.3)

## 2010-10-06 LAB — COMPREHENSIVE METABOLIC PANEL
ALT: 23 U/L (ref 0–35)
AST: 23 U/L (ref 0–37)
Albumin: 5 g/dL (ref 3.5–5.2)
Alkaline Phosphatase: 71 U/L (ref 39–117)
BUN: 31 mg/dL — ABNORMAL HIGH (ref 6–23)
CO2: 18 mEq/L — ABNORMAL LOW (ref 19–32)
Calcium: 11.2 mg/dL — ABNORMAL HIGH (ref 8.4–10.5)
Chloride: 104 mEq/L (ref 96–112)
Creatinine, Ser: 1.71 mg/dL — ABNORMAL HIGH (ref 0.40–1.20)
Glucose, Bld: 128 mg/dL — ABNORMAL HIGH (ref 70–99)
Potassium: 3.7 mEq/L (ref 3.5–5.3)
Sodium: 140 mEq/L (ref 135–145)
Total Bilirubin: 0.3 mg/dL (ref 0.3–1.2)
Total Protein: 8.1 g/dL (ref 6.0–8.3)

## 2010-10-06 LAB — VITAMIN D 25 HYDROXY (VIT D DEFICIENCY, FRACTURES): Vit D, 25-Hydroxy: 50 ng/mL (ref 30–89)

## 2010-10-06 LAB — LACTATE DEHYDROGENASE: LDH: 180 U/L (ref 94–250)

## 2010-10-06 LAB — CANCER ANTIGEN 27.29: CA 27.29: 34 U/mL (ref 0–39)

## 2010-10-16 LAB — COMPREHENSIVE METABOLIC PANEL
ALT: 22 U/L (ref 0–35)
AST: 24 U/L (ref 0–37)
Albumin: 4.9 g/dL (ref 3.5–5.2)
Alkaline Phosphatase: 71 U/L (ref 39–117)
BUN: 27 mg/dL — ABNORMAL HIGH (ref 6–23)
CO2: 19 mEq/L (ref 19–32)
Calcium: 10.4 mg/dL (ref 8.4–10.5)
Chloride: 107 mEq/L (ref 96–112)
Creatinine, Ser: 1.1 mg/dL (ref 0.40–1.20)
Glucose, Bld: 111 mg/dL — ABNORMAL HIGH (ref 70–99)
Potassium: 3.6 mEq/L (ref 3.5–5.3)
Sodium: 140 mEq/L (ref 135–145)
Total Bilirubin: 0.3 mg/dL (ref 0.3–1.2)
Total Protein: 7.4 g/dL (ref 6.0–8.3)

## 2010-10-16 LAB — CALCIUM, IONIZED: Calcium, Ion: 1.44 mmol/L — ABNORMAL HIGH (ref 1.12–1.32)

## 2010-10-25 ENCOUNTER — Encounter: Admission: RE | Admit: 2010-10-25 | Discharge: 2010-10-25 | Payer: Self-pay | Admitting: Oncology

## 2010-12-16 ENCOUNTER — Encounter: Payer: Self-pay | Admitting: Oncology

## 2010-12-25 NOTE — Procedures (Signed)
Summary: Colonoscopy   Colonoscopy  Procedure date:  09/06/2009  Findings:      Location:  [facility].   COLONOSCOPY PROCEDURE REPORT  PATIENT:  Gloria, Fields  MR#:  [id number] BIRTHDATE:   03-16-1946, 63 yrs. old   GENDER:   female  ENDOSCOPIST:   Vania Rea. Jarold Motto, MD, [facility] Referred by: Pierce Crane, M.D.  PROCEDURE DATE:  09/06/2009 PROCEDURE:  Average-risk screening colonoscopy G0121 ASA CLASS:   Class II INDICATIONS: Routine Risk Screening   MEDICATIONS:    Fentanyl 100 mcg IV, Versed 12 mg IV  DESCRIPTION OF PROCEDURE:   After the risks benefits and alternatives of the procedure were thoroughly explained, informed consent was obtained.  Digital rectal exam was performed and revealed no abnormalities.   The LB CF-H180AL [id number] endoscope was introduced through the anus and advanced to the sigmoid colon, limited by a tortuous and redundant colon.  PROBABLE ADHESIONS FROM PRIOR SURGERY.RUPTURED APPENDIX SEVERAL YEARS AGO.  The quality of the prep was adequate, using MiraLax.  The instrument was then slowly withdrawn as the colon was fully examined. <<IMAGES HERE>>  FINDINGS:  Scattered diverticula were found. UNABLE TO PASS MID SIGMOID AREA EVEN WITH PEDIATRIC SCOPE.ADHESIONS AND "BOUND DOWN" COLON.   Retroflexed views in the rectum revealed no abnormalities.    The scope was then withdrawn from the patient and the procedure completed.  COMPLICATIONS:   None  ENDOSCOPIC IMPRESSION:  1) Diverticula, scattered  INCOMPLETE EXAM. RECOMMENDATIONS:  BARIUM ENEMA TODAY AT [facility].  REPEAT EXAM:   No   _______________________________ Vania Rea. Jarold Motto, MD, [facility]  CC: Pierce Crane, MD

## 2010-12-25 NOTE — Miscellaneous (Signed)
Summary: Orders Update  Clinical Lists Changes  Problems: Added new problem of SPECIAL SCREENING FOR MALIGNANT NEOPLASMS COLON (ICD-V76.51) Orders: Added new Test order of Barium Enema (BE) - Signed

## 2010-12-25 NOTE — Miscellaneous (Signed)
Summary: [facility] Previsit/prep  Clinical Lists Changes  Medications: Added new medication of MOVIPREP 100 GM  SOLR (PEG-KCL-NACL-NASULF-NA ASC-C) As per prep instructions. - Signed Rx of MOVIPREP 100 GM  SOLR (PEG-KCL-NACL-NASULF-NA ASC-C) As per prep instructions.;  #1 x 0;  Signed;  Entered by: Wyona Almas RN;  Authorized by: Mardella Layman MD [facility];  Method used: Electronically to [facility] # [phone]*, [address] [place], [place], [place]  [address], Ph: [phone], Fax: [phone] Allergies: Added new allergy or adverse reaction of MORPHINE Observations: Added new observation of NKA: F (08/28/2009 9:02)    Prescriptions: MOVIPREP 100 GM  SOLR (PEG-KCL-NACL-NASULF-NA ASC-C) As per prep instructions.  #1 x 0   Entered by:   Wyona Almas RN   Authorized by:   Mardella Layman MD [facility]   Signed by:   Wyona Almas RN on 08/28/2009   Method used:   Electronically to        [facility] # [phone]* (retail)       [address] [place], [place]  [address]       Ph: [phone]       Fax: [phone]   RxID:   [id number]

## 2011-02-25 ENCOUNTER — Other Ambulatory Visit: Payer: Self-pay | Admitting: Family Medicine

## 2011-02-25 DIAGNOSIS — R42 Dizziness and giddiness: Secondary | ICD-10-CM

## 2011-03-05 ENCOUNTER — Ambulatory Visit
Admission: RE | Admit: 2011-03-05 | Discharge: 2011-03-05 | Disposition: A | Discharge status: Home / Self Care | Payer: BC Managed Care – PPO | Source: Ambulatory Visit | Attending: Family Medicine | Admitting: Family Medicine

## 2011-03-05 DIAGNOSIS — R42 Dizziness and giddiness: Secondary | ICD-10-CM

## 2011-03-05 MED ORDER — IOHEXOL 300 MG/ML  SOLN
75.0000 mL | Freq: Once | INTRAMUSCULAR | Status: AC | PRN
  Administered 2011-03-05: 75 mL via INTRAVENOUS

## 2011-04-03 ENCOUNTER — Other Ambulatory Visit: Payer: Self-pay | Admitting: Oncology

## 2011-04-03 ENCOUNTER — Encounter (HOSPITAL_BASED_OUTPATIENT_CLINIC_OR_DEPARTMENT_OTHER): Payer: BC Managed Care – PPO | Admitting: Oncology

## 2011-04-03 DIAGNOSIS — C50419 Malignant neoplasm of upper-outer quadrant of unspecified female breast: Secondary | ICD-10-CM

## 2011-04-03 DIAGNOSIS — Z170 Estrogen receptor positive status [ER+]: Secondary | ICD-10-CM

## 2011-04-03 LAB — CBC WITH DIFFERENTIAL/PLATELET
BASO%: 0.4 % (ref 0.0–2.0)
Basophils Absolute: 0 10*3/uL (ref 0.0–0.1)
EOS%: 4.7 % (ref 0.0–7.0)
Eosinophils Absolute: 0.3 10*3/uL (ref 0.0–0.5)
HCT: 32 % — ABNORMAL LOW (ref 34.8–46.6)
HGB: 10.9 g/dL — ABNORMAL LOW (ref 11.6–15.9)
LYMPH%: 22.1 % (ref 14.0–49.7)
MCH: 28.5 pg (ref 25.1–34.0)
MCHC: 34.1 g/dL (ref 31.5–36.0)
MCV: 83.4 fL (ref 79.5–101.0)
MONO#: 0.4 10*3/uL (ref 0.1–0.9)
MONO%: 6.2 % (ref 0.0–14.0)
NEUT#: 4.2 10*3/uL (ref 1.5–6.5)
NEUT%: 66.6 % (ref 38.4–76.8)
Platelets: 216 10*3/uL (ref 145–400)
RBC: 3.84 10*6/uL (ref 3.70–5.45)
RDW: 14.5 % (ref 11.2–14.5)
WBC: 6.4 10*3/uL (ref 3.9–10.3)
lymph#: 1.4 10*3/uL (ref 0.9–3.3)

## 2011-04-04 LAB — COMPREHENSIVE METABOLIC PANEL
ALT: 20 U/L (ref 0–35)
AST: 22 U/L (ref 0–37)
Albumin: 4.6 g/dL (ref 3.5–5.2)
Alkaline Phosphatase: 83 U/L (ref 39–117)
BUN: 32 mg/dL — ABNORMAL HIGH (ref 6–23)
CO2: 18 mEq/L — ABNORMAL LOW (ref 19–32)
Calcium: 9.3 mg/dL (ref 8.4–10.5)
Chloride: 106 mEq/L (ref 96–112)
Creatinine, Ser: 1.12 mg/dL (ref 0.40–1.20)
Glucose, Bld: 106 mg/dL — ABNORMAL HIGH (ref 70–99)
Potassium: 3.8 mEq/L (ref 3.5–5.3)
Sodium: 139 mEq/L (ref 135–145)
Total Bilirubin: 0.3 mg/dL (ref 0.3–1.2)
Total Protein: 7.2 g/dL (ref 6.0–8.3)

## 2011-04-04 LAB — VITAMIN D 25 HYDROXY (VIT D DEFICIENCY, FRACTURES): Vit D, 25-Hydroxy: 46 ng/mL (ref 30–89)

## 2011-04-04 LAB — CANCER ANTIGEN 27.29: CA 27.29: 32 U/mL (ref 0–39)

## 2011-04-09 NOTE — Op Note (Signed)
Gloria Fields, Gloria Fields              ACCOUNT NO.:  [id number]   MEDICAL RECORD NO.:  [id number]          PATIENT TYPE:  AMB   LOCATION:  DAY                          FACILITY:  [facility]   PHYSICIAN:  Thornton Park. Daphine Deutscher, MD  DATE OF BIRTH:  1946-11-24   DATE OF PROCEDURE:  07/31/2007  DATE OF DISCHARGE:                               OPERATIVE REPORT   PREOPERATIVE DIAGNOSIS:  Upper midline ventral incisional hernia.   POSTOPERATIVE DIAGNOSIS:  Upper midline ventral incisional hernia.   PROCEDURE:  Open repair using Proceed mesh.   SURGEON:  Luretha Murphy, M.D.   ASSISTANT:  Anselm Pancoast. Zachery Dakins, M.D.   ANESTHESIA:  General endotracheal.   DESCRIPTION OF PROCEDURE:  Ms. Colasurdo was taken to room 11 on Friday,  July 31, 2007, and given general anesthesia.  The abdomen was  prepped with Techni-Care and draped sterilely.  An upper midline  incision was made and the hernia sac was dissected free.  I opened it  from the inside and took down adhesions and well delineated the fascial  rim.  I got a piece of Proceed mesh, size was 4 x 6 inches, and tacked  it well back from the perimeter with horizontal mattress sutures of 0  Prolene; approximately 8 such sutures were used to tack this around the  perimeter of this defect.  The Proceed, when tied down, was very snug  against the anterior abdominal wall.  The omentum lay beneath that.  There was essentially no bleeding noted.  I used Tisseel in the flaps as  well as close the dead space from where I dissected the hernia sac; this  I did after irrigating well and then I closed the skin with 4-0 Vicryl  and also with Dermabond.  An abdominal binder was placed and the patient  was taken to the recovery room in satisfactory condition.      Thornton Park Daphine Deutscher, MD  Electronically Signed     MBM/MEDQ  D:  07/31/2007  T:  07/31/2007  Job:  [id number]   cc:   L. Lupe Carney, M.D.  Fax: [phone]

## 2011-04-09 NOTE — Discharge Summary (Signed)
NAMEMARVELLE, SPAN              ACCOUNT NO.:  [id number]   MEDICAL RECORD NO.:  [id number]          PATIENT TYPE:  INP   LOCATION:  1531                         FACILITY:  [facility]   PHYSICIAN:  Thornton Park. Daphine Deutscher, MD  DATE OF BIRTH:  [birth year]   DATE OF ADMISSION:  07/31/2007  DATE OF DISCHARGE:  08/02/2007                               DISCHARGE SUMMARY   ADMITTING DIAGNOSIS:  Ventral incisional hernia.   POSTOPERATIVE DIAGNOSIS:  Ventral incisional hernia.   PROCEDURE:  Open repair with Proceed mesh using Tisseel to seal the  flaps.   COURSE IN HOSPITAL:  Kaniya Trueheart underwent an open repair of a  ventral incisional hernia with Proceed mesh augmented by Tisseel to help  seal the flaps.  This was done by Dr. Daphine Deutscher with Dr. Zachery Dakins  assisting him.  Postoperatively, the patient did well, although the  first postop day she was slow to get up and was having difficulty with  that and as well was not taking an adequate amount of p. o.'s.  She did  have a good support network with her friends who stayed with her, and  they were ready to take her home on postoperative day #2.  She was given  a prescription for Percocet 7.500 (#30) to take for pain.  She was  instructed to call the office and return in 2-3 weeks in followup.  Abdominal binder was in place, and she seemed to tolerating everything  very well and getting around very well.   FINAL DIAGNOSES:  Incisional hernia status post repair.   DISCHARGE MEDICATIONS:  1. Wellbutrin XL 300 mg q.a.m.  2. Arimidex 1 mg p.o. q.a.m.  3. Teveten 600 mg, 225 mg in the morning.  4. Aciphex 20 mg b.i.d.  5. Adderall 10 mg b.i.d.  6. Ativan 1 mg as needed.  7. Simvastatin 20 mg at bedtime.  8. Ambien 10 mg at bedtime.  9. Citalopram hydrobromide 10 mg at bedtime.  10.Centrum Silver vitamins.  11.Caltrate one a day.  12.B complex.  13.In addition she will take Percocet as needed for pain.   CONDITION:  Good.      Thornton Park  Daphine Deutscher, MD  Electronically Signed     MBM/MEDQ  D:  08/02/2007  T:  08/02/2007  Job:  [id number]

## 2011-04-10 ENCOUNTER — Encounter (HOSPITAL_BASED_OUTPATIENT_CLINIC_OR_DEPARTMENT_OTHER): Payer: BC Managed Care – PPO | Admitting: Oncology

## 2011-04-10 DIAGNOSIS — Z170 Estrogen receptor positive status [ER+]: Secondary | ICD-10-CM

## 2011-04-10 DIAGNOSIS — C50419 Malignant neoplasm of upper-outer quadrant of unspecified female breast: Secondary | ICD-10-CM

## 2011-04-12 NOTE — Discharge Summary (Signed)
Gloria Fields, Gloria Fields              ACCOUNT NO.:  [id number]   MEDICAL RECORD NO.:  [id number]          PATIENT TYPE:  INP   LOCATION:  5730                         FACILITY:  [facility]   PHYSICIAN:  Sherin Quarry, MD      DATE OF BIRTH:  [birth year]   DATE OF ADMISSION:  01/31/2006  DATE OF DISCHARGE:                                 DISCHARGE SUMMARY   Gloria Fields is a 65 year old lady with a past history of depression and  hypertension who initially presented to [facility] on January 31, 2006  with a one-week history of extreme malaise and fatigue. She had been  admitted for about a week before she presented. She had been having  intermittent nausea, vomiting and decreased bowel function. Family noted  that she looked increasingly ill as the days went on. When she arrived in  the emergency room, blood pressure was 79/56, pulse was 95. She was noted to  have a creatinine of four with metabolic acidosis. Dr. Renford Dills was  asked to see the patient. On physical exam, he noted that her blood pressure  after receiving intravenous fluids was 112/59, pulse was 101, temperature  96.2. HEENT exam was within normal limits. The chest was clear.  Cardiovascular exam revealed normal S1-S2 without rubs, murmurs or gallops.  On abdominal exam the abdomen was described as distended. Guarding was  present. No masses could be appreciated. The patient had severe diffuse  abdominal pain. On admission, Dr. Nehemiah Settle placed the patient on IV of normal  saline at 250 cc/hour. He empirically placed her on Zosyn 2.25 grams IV  every 8 hours. She was made n.p.o. A CT scan of the abdomen and pelvis was  obtained. This showed a significant amount of free fluid and free air  consistent with a hollow viscus perforation. The radiologist suspected that  the patient had a colon perforation. He described the cecum as distended.  Therefore consultation was obtained from Dr. Luretha Murphy who recommended  exploratory laparotomy. Exploratory laparotomy was carried out on January 31, 2006 and a radical peritoneal debridement with Stryker irrigator was  performed. Dr. Daphine Deutscher described the pelvis and cecum area as very stuck  down. The appendix was described as nasty but not obviously perforated. It  was removed. The abdomen was described as containing substantial pus. Dr.  Daphine Deutscher ran the entire colon and small bowel and examined the stomach and  could find no perforations. He irrigated the abdomen with 6 liters of saline  and then closely it with retention sutures. Thus in summary, Dr. Daphine Deutscher  performed an exploratory laparotomy, appendectomy, radical peritoneal  debridement and extensive irrigation of the abdomen. Postoperatively, the  patient was moved to the intensive care unit and was administered  substantial intravenous fluids as well as 2 units of packed red blood cells.  Intravenous Zosyn was continued and vancomycin was added to this regimen.  TNA nutrition was instituted. The patient became extremely agitated,  confused and frankly psychotic which was a significant factor in her  management. Because the patient had been on chronic Ativan as an outpatient,  chronic Ativan therapy was instituted. Haldol was administered and a dose of  2 to 5 milligrams IV every 6 hours p.r.n. for agitation. The patient was  empirically given thiamine as part of her TNA protocol. With hydration, the  patient's renal function improved substantially. By February 06, 2006, she was  less agitated but was refusing to talk and still seemed to be frankly  psychotic. She was seen by Dr. Jeanie Sewer in consultation and he recommended  continuing the use of p.r.n. Haldol as well as starting Zyprexa 2.5  milligrams p.o. or IM daily. The patient tolerated this medication well and  on February 09, 2006 I increased his Zyprexa to 5 milligrams daily. The patient  was carefully monitored by the surgical service. On February 08, 2005,  the  patient experienced a fever with temperature up to 102 degrees. Workup  included a chest x-ray which showed evidence of mild atelectasis. A  urinalysis which was negative. Serial blood cultures which were negative.  The abdomen did not appear to be the source of the fever as it was quite  benign to palpation. I suspected that a possible source of fever might be  her central line. Vancomycin was once again added to her antibiotic therapy.  By February 10, 2006, the patient was much more alert and was eating some  breakfast. She was still somewhat paranoid but much less delusional. Her  hemoglobin level had gradually dropped to 7.5 and therefore she was given 2  units of blood. It was felt reasonable at that time to taper and discontinue  her TPN. As of February 10, 2006, the plan would be to 1) taper TPN, 2)  continue IV antibiotics another two to three days, 3) to increase physical  activity, 4) to initiate discharge planning.   DIAGNOSES:  1.  February 10, 2006, acute bowel perforation with purulent peritonitis status      post exploratory laparotomy appendectomy and radicle peritoneal      debridement.  2.  Metabolic acidosis and renal failure, resolved.  3.  Depression.  4.  Postoperative psychosis. Long-term medications are yet to be determined.      These will be outlined in subsequent dictation.           ______________________________  Sherin Quarry, MD     SY/MEDQ  D:  02/10/2006  T:  02/10/2006  Job:  [id number]   cc:   Thornton Park Daphine Deutscher, MD  [address]  [place]  [place] [address]   Donia Guiles, M.D.  Fax: [phone]   Antonietta Breach, M.D.

## 2011-04-12 NOTE — Consult Note (Signed)
NAMEJEFFREY, VOTH              ACCOUNT NO.:  [id number]   MEDICAL RECORD NO.:  [id number]          PATIENT TYPE:  INP   LOCATION:  5730                         FACILITY:  [facility]   PHYSICIAN:  Antonietta Breach, M.D.  DATE OF BIRTH:  03-Jul-1946   DATE OF CONSULTATION:  02/06/2006  DATE OF DISCHARGE:  02/13/2006                                   CONSULTATION   REQUESTING PHYSICIAN:  Dr. Sherin Quarry   REASON FOR CONSULTATION:  Depression.   HISTORY OF PRESENT ILLNESS:  Mrs. Beckstrom is a 65 year old female admitted to  the [facility] on the 9th of March with an acute appendicitis  and peritonitis.  The patient had been at home sick for a week.  She was  sleeping 16 hours per day and then a friend made her come to the emergency  room.  Currently, the patient is very irritable.  She is sleeping  excessively.  Her mood is depressed.  Her concentration is poor.  Her  interests are poor.  She has stated to somebody who has come to visit her  leave me alone, let me die.   Recent treatments for attention deficit have been Adderall 20 mg tablets two  in the morning and one at noon.  Patient also has recently been treated with  trazodone.  She also has been on Cymbalta 60 mg a day.   PAST PSYCHIATRIC HISTORY:  There is a reported history of depression as well  as attention deficit hyperactivity disorder.  The psychotropics used to  treat the above are not known and the client is not willing to discuss this.   FAMILY PSYCHIATRIC HISTORY:  None known.   SOCIAL HISTORY:  The patient's parents are deceased.  The patient is single.  Occupation:  Retired Engineer, site.  Alcohol:  None known.  Illegal drugs:  None.   GENERAL MEDICAL PROBLEMS:  Irritable bowel syndrome.   MEDICATIONS:  The MAR is reviewed.  Psychotropics include 40 mg of Adderall  q.a.m. and 20 mg at noon,  Cymbalta 60 mg q.a.m.   LABORATORY DATA:  The hemoglobin is low at 9.1.  Glucose 155, BUN 16,  creatinine 0.9, AST 49, ALT 53.   REVIEW OF SYSTEMS:  CONSTITUTIONAL:  No fever.  Eyes:  No visual changes.  The patient is a limited historian at this time due to her discomfort but  reviewing the record there is no report of any hearing problems or sore  throat.  CARDIOVASCULAR:  No chest pain.  RESPIRATORY:  No coughing or  wheezing.  GASTROINTESTINAL:  As above.  GENITOURINARY:  No dysuria.  MUSCULOSKELETAL:  No deformities, weaknesses, or atrophies.  SKIN:  Unremarkable.  NEUROLOGIC:  Unremarkable.  PSYCHIATRIC:  As above.  ENDOCRINE:  Unremarkable.  HEMATOLOGIC/LYMPHATIC:  Anemia.  The patient has  no known drug allergies.   EXAMINATION:  VITAL SIGNS:  Temperature 99.3, pulse 82, respirations 20,  blood pressure 134/64, O2 saturation is 99% on room air.  MENTAL STATUS:  Mrs. Fentress is an alert middle-aged female with partial eye  contact.  Her orientation extent is unclear.  She is oriented to the self.  Thought content, reflex, severe paranoia.  She states I am tired of people  looking at me through the window when there are no people looking at her  through the window.  Her speech is mildly pressured.  Her affect is  irritable.  Also on thought content she does appear to be attending to  internal stimuli.  Her memory is grossly impaired.  She is unable to  participate in specific formal mental status testing.  Her thought process  is partially disorganized.  Her judgment is impaired.  Her insight is poor.  Her fund of knowledge and intelligence are drastically below that of her  estimated pre morbid baseline.  Also on thought content she has suicidal  ideation.  There is no homicidal ideation.  She has an increased guarded  affect as the interview proceeds.  She is refusing to answer any further  questions.  Her concentration is poor.  She is unable to maintain attention  to the subject.  She repeats repeatedly to let me die.   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  AXIS II:  Deferred.  AXIS III:  Please see the general medical problems above.  AXIS IV:  General medical.  AXIS V:  20.   RECOMMENDATIONS:  1.  Continue Haldol 2-4 mg IV q.6h. p.r.n. severe agitation.  2.  For the patient's standing antipsychotic regimen would start Zyprexa      conservatively at 2.5 mg p.o. or IM every 1600 and would titrate by 2.5-      5 mg per day as tolerated to an estimated trial dose of 10 mg p.o. daily      initially.  3.  The disposition psychiatric recommendation will depend upon the      psychiatric status of the patient at the time of general medical      clearance.      Antonietta Breach, M.D.  Electronically Signed     JW/MEDQ  D:  05/20/2006  T:  05/20/2006  Job:  [id number]

## 2011-04-12 NOTE — Discharge Summary (Signed)
Gloria Fields, JERRY              ACCOUNT NO.:  [id number]   MEDICAL RECORD NO.:  [id number]          PATIENT TYPE:  INP   LOCATION:  5730                         FACILITY:  [facility]   PHYSICIAN:  Kela Millin, M.D.DATE OF BIRTH:  07/06/1946   DATE OF ADMISSION:  01/31/2006  DATE OF DISCHARGE:                                 DISCHARGE SUMMARY   ADDENDUM  Addendum to discharge summary dictated on February 10, 2006, by Dr. Sherin Quarry.   ADDENDUM TO HOSPITAL COURSE:  The patient's TPN was discontinued on February 10, 2006, and she was started on a diet. A calorie count was initiated per  nutrition and it was noted that the patient was taking in adequate calories.  Also, the patient was maintained on IV antibiotics throughout her hospital  stay. She defervesced, has remained afebrile, and her white cell count is  back to normal - 9 today prior to discharge. The patient has received about  2 weeks of IV antibiotics and will not need any further antibiotics upon  discharge. [name] is alert and appropriate. She has been followed by  physical therapy and has improved significantly and their recommendation at  this time is to be discharged home with home health PT. The patient and  family also state that they have arranged for someone to be there with the  patient at home.   DISCHARGE MEDICATIONS:  1.  Zyprexa 5 mg one p.o. daily.  2.  Trazodone 100 mg once p.o. daily.  3.  AcipHex 20 mg p.o. b.i.d.  4.  Teveten 600/25 mg one p.o. daily.  5.  Multivitamin one p.o. daily.  6.  Ativan 1 mg p.o. b.i.d. p.r.n.  7.  Vicodin 5 mg one p.o. q.4-6h. p.r.n.  8.  Fosamax 10 mg p.o. daily.   The patient has been advised to hold the amphetamine salt and Cymbalta until  she follows up with her outpatient psychiatrist as she was started on  Zyprexa along with her trazodone and Ativan while she was in the hospital by  Dr. Jeanie Sewer. The patient has also been instructed to hold on Zelnorm and  etodolac until she follows up with her primary care physician.   FOLLOWUP CARE:  1.  Dr. Daphine Deutscher with [facility] in 2 weeks. The patient to call [phone]      [phone] for appointment.  2.  Primary care physician at [facility] in 1 week. The patient      to call [phone] for appointment.  3.  The patient to follow up with psychiatrist, Dr. Shela Nevin, in 1-2 weeks. The      patient to call for appointment.   DISCHARGE CONDITION:  Improved/stable.      Kela Millin, M.D.  Electronically Signed     ACV/MEDQ  D:  02/13/2006  T:  02/13/2006  Job:  [id number]   cc:   [facility]   Thornton Park. Daphine Deutscher, MD  [address]  [place]  [place] [address]   Darlis Loan, PhD

## 2011-04-12 NOTE — Op Note (Signed)
Gloria Fields, Gloria Fields              ACCOUNT NO.:  [id number]   MEDICAL RECORD NO.:  [id number]          PATIENT TYPE:  AMB   LOCATION:  DSC                          FACILITY:  [facility]   PHYSICIAN:  Rose Phi. Maple Hudson, M.D.   DATE OF BIRTH:  10-15-1946   DATE OF PROCEDURE:  11/04/2006  DATE OF DISCHARGE:                               OPERATIVE REPORT   PREOPERATIVE DIAGNOSIS:  Stage I carcinoma of the right breast.   POSTOPERATIVE DIAGNOSIS:  Stage I carcinoma of the right breast.   OPERATION:  1. Blue dye injection.  2. Right partial mastectomy with needle localization and specimen      mammogram.  3. Right sentinel lymph node biopsy.   SURGEON:  Dr. Francina Ames.   ANESTHESIA:  General.   OPERATIVE PROCEDURE:  Prior to coming to the operating room, a  localizing wire had been placed in the 10 o'clock position of the right  breast where the primary tumor was.  In addition, 1 mCi of technetium  sulfur colloid was injected intradermally at the right breast.   After suitable general anesthesia was induced, the patient was placed in  the supine position with the arms extended on the arm board; 5 mL of a  mixture of 2 mL of methylene blue and 3 mL of injectable saline was  injected in the subareolar tissue and the breast gently massaged for 3  minutes.  We then prepped and draped the breast and axilla.   A curved incision in the upper-outer quadrant of the right breast was  then made, incorporating the previously placed wire.  Wide excision of  the wire and surrounding tissue was carried out and the specimen  oriented for the pathologist.  It was submitted to the radiologist for  specimen mammogram, and then to the pathologist for evaluation of the  margins.   While that was being done, a short transverse right axillary incision  was made with dissection through the subcutaneous tissue to the  clavipectoral fascia.  Deep to the fascia were 3 separate blue and hot  lymph nodes, which  were removed as sentinel nodes.  There were no other  blue or hot or palpable nodes.   While these were also being evaluated, both incisions were injected with  0.25% Marcaine and 1% Xylocaine and closed with 3-0 Vicryl and a  subcuticular 4-0 Monocryl.   The sentinel nodes were reported as negative and the margins as clear,  having already heard that the specimen mammogram confirmed the removal  of the lesion.   Dermabond was applied to the incisions, and dressings then applied, and  the patient transferred to the recovery room in satisfactory condition,  having tolerated the procedure well.      Rose Phi. Maple Hudson, M.D.  Electronically Signed     PRY/MEDQ  D:  11/04/2006  T:  11/05/2006  Job:  [id number]

## 2011-04-12 NOTE — H&P (Signed)
NAMECLEMENCE, Fields              ACCOUNT NO.:  [id number]   MEDICAL RECORD NO.:  [id number]          PATIENT TYPE:  EMS   LOCATION:  MAJO                         FACILITY:  [facility]   PHYSICIAN:  Deirdre Peer. Polite, M.D. DATE OF BIRTH:  18-Aug-1946   DATE OF ADMISSION:  01/31/2006  DATE OF DISCHARGE:                                HISTORY & PHYSICAL   CHIEF COMPLAINT:  Abdominal pain.   HISTORY OF PRESENT ILLNESS:  [name] is a 65 year old female with  problems of depression, spastic colon, questionable hypertension, ADHD, who  presents to the ED per the request of her friend.  According to the friend,  the patient has essentially been bedridden for about a week, sleeping about  16 hours a day.  The patient has not been caring for herself well, had some  episodes of nausea and vomiting x 1, and some bouts of diarrhea and  constipation of lately, mainly, constipation.  Because of the patient's poor  appearance at home, her friend brought her to the ED for further evaluation.  In the ED, the patient was evaluated, thought to be hypotensive, blood  pressure 79/56, pulse 95, respiratory rate 24, with acute renal failure with  creatinine of 4, however, electrolytes were OK, there was some metabolic  acidosis, 15.6 CO2.  The patient's UA is essentially within normal limits.  Urine drug screen positive for benzodiazepines and amphetamines.  Alcohol  level less than 5.  Salicylate level within normal limits.  Chest x-ray  atelectasis.  Nonspecific bowel gas pattern, small bowel with air  but no  distention.  [facility] Hospitalist called for further evaluation.  The patient  was able to give some history, states basically just not feeling well,  again, only admitted to emesis x 1, denies any overdose of her medications,  she states she has been taking them the way she is supposed to, however, her  friend thinks that she may be taking too much medication.  The patient  denies any fevers, chills,  she does admit to nausea and vomiting x 1, no  chest pain, no shortness of breath, she does admit to abdominal pain and  sensation of bloating.  Please note, the patient denies any abdominal  surgeries.  The patient states she has been urinating OK and has the usual  changes of her stools characterized by occasional diarrhea followed by  constipation but mainly has been having constipation.  Admission is for  leukocytosis, acute renal failure, and metabolic acidosis.   PAST MEDICAL HISTORY:  Significant for depression, irritable bowel,  hypertension, ADHD.   MEDICATIONS ON ADMISSION:  Skelaxin 800 mg t.i.d., amphetamine 20 mg, 2 in  the a.m. and one at lunch, Lorazepam 2 tabs daily, Fosamax 10 mg, Entolase  500 mg b.i.d., Trazodone 1/2 q.h.s., Cymbalta 60 mg daily, HCTZ 25 daily,  Benadryl p.r.n., Zelnorm 6 mg b.i.d.   SOCIAL HISTORY:  Denies tobacco, social alcohol, denies any drugs.   PAST SURGICAL HISTORY:  None.   PHYSICAL EXAMINATION:  GENERAL:  Pale appearance.  VITAL SIGNS:  Blood pressure 112/59, pulse 101, on arrival to  the ED,  temperature 96.2, blood pressure 81/61, pulse 90, respiratory rate 28.  HEENT:  Pale sclerae. Dry oral mucosa.  No nodes, no JVD.  CHEST:  Moderate air movement without rales or rhonchi.  CARDIOVASCULAR:  Regular.  ABDOMEN:  Slightly distended, positive guarding, I could not appreciate any  mass, however, please note exam is limited secondary to complaint of  significant pain.  EXTREMITIES:  No edema, 2+ pulses.  NEUROLOGICAL:  Other than generalized lethargy, cranial nerves 2-12 intact.   IMPRESSION:  1.  Increased somnolence, lethargy, anhedonia, sleeping greater than 16      hours a day.  2.  Rule out sepsis.  3.  Nausea and vomiting x 1.  4.  Abdominal pain, please note the x-ray shows nonspecific bowel gas      pattern but no distention.  5.  Acute renal failure, creatinine 4, differential includes NSAIDs versus      prerenal.  6.   Leukocytosis at 23,000.  7.  Hypotension, improved with IV fluids.  8.  History of ADHD.  9.  Depression.   RECOMMENDATIONS:  The patient admitted, antibiotics, IV fluids, will obtain  a serum lactate, CT of the abdomen, consider surgical evaluation.  Will  order a stat BMP at this time to see if there is some improvement of her  metabolic acidosis, if not, will add bicarb.  Will hold all sedative  medications.  We will make further recommendations after review of the above  studies.      Deirdre Peer. Polite, M.D.  Electronically Signed     RDP/MEDQ  D:  01/31/2006  T:  01/31/2006  Job:  [id number]   cc:   [facility]

## 2011-04-12 NOTE — Op Note (Signed)
[name], LOVE              ACCOUNT NO.:  [id number]   MEDICAL RECORD NO.:  [id number]          PATIENT TYPE:  INP   LOCATION:  2306                         FACILITY:  [facility]   PHYSICIAN:  Thornton Park. Daphine Deutscher, MD  DATE OF BIRTH:  [birth year]   DATE OF PROCEDURE:  01/31/2006  DATE OF DISCHARGE:                                 OPERATIVE REPORT   ADMITTING DIAGNOSIS:  Sepsis, peritonitis.   POSTOP DIAGNOSIS:  Purulent peritonitis with possible appendicitis as  etiology but no bowel perforation found.   PROCEDURE:  Exploratory laparotomy, appendectomy, radical peritoneal  debridement with Stryker irrigator.   SURGEON:  Daphine Deutscher.   ASSISTANT:  Streck.   ANESTHESIA:  General endotracheal.   One JP drain in the pelvis brought out on the right side.   DESCRIPTION OF PROCEDURE:  Gloria Fields was taken directly to OR 6 on  January 31, 2006 given general anesthesia. Preop she received Zosyn.  Abdomen  was prepped with chlorhexidine and draped. Midline laparotomy was then made  and extended as we encountered a yellow mildly smelling pus. Aerobic and  anaerobic cultures were obtained. Generous laparotomy incision was made. I  found a lot of things stuck down the pelvis with the cecum and the appendix  stuck into this mass below. Appendix had a nasty look to it but had not  frankly perforated. This was mobilized and subsequently removed by ligating  the base with a 2-0 Vicryl and 2-0 Vicryl used on the appendiceal vessels.  It was removed.  Meantime, I went ahead and broke up pus above the liver and  above the spleen and along both gutters and inner loop and carefully  followed the bowel. She had some thickening in the sigmoid but no tumors  were felt and no perforation could be identified.  I ran the entire colon  and small bowel and looked at the stomach and went in the lesser sac and  looked at the duodenum and no perforations be seen. I then washed irrigated  with 6 liters of saline  using the pulsatile lavage. I then closed her using  three retention sutures of #1 nylon with running Prolene from above and  below and tied in the middle. The wound was left open except for some  staples placed around the navel.  The patient was taken to recovery room and  will be taken back to intensive care unit postoperatively.      Thornton Park Daphine Deutscher, MD  Electronically Signed     MBM/MEDQ  D:  01/31/2006  T:  02/03/2006  Job:  [id number]   cc:   Deirdre Peer. Polite, M.D.

## 2011-04-17 ENCOUNTER — Ambulatory Visit: Payer: BC Managed Care – PPO | Attending: Neurology | Admitting: Rehabilitative and Restorative Service Providers"

## 2011-04-17 DIAGNOSIS — IMO0001 Reserved for inherently not codable concepts without codable children: Secondary | ICD-10-CM | POA: Insufficient documentation

## 2011-04-17 DIAGNOSIS — R269 Unspecified abnormalities of gait and mobility: Secondary | ICD-10-CM | POA: Insufficient documentation

## 2011-04-17 DIAGNOSIS — R42 Dizziness and giddiness: Secondary | ICD-10-CM | POA: Insufficient documentation

## 2011-05-06 ENCOUNTER — Ambulatory Visit: Payer: BC Managed Care – PPO | Attending: Neurology | Admitting: Rehabilitative and Restorative Service Providers"

## 2011-05-06 DIAGNOSIS — R42 Dizziness and giddiness: Secondary | ICD-10-CM | POA: Insufficient documentation

## 2011-05-06 DIAGNOSIS — R269 Unspecified abnormalities of gait and mobility: Secondary | ICD-10-CM | POA: Insufficient documentation

## 2011-05-06 DIAGNOSIS — IMO0001 Reserved for inherently not codable concepts without codable children: Secondary | ICD-10-CM | POA: Insufficient documentation

## 2011-05-08 ENCOUNTER — Ambulatory Visit: Payer: BC Managed Care – PPO | Admitting: Rehabilitative and Restorative Service Providers"

## 2011-05-13 ENCOUNTER — Ambulatory Visit: Payer: BC Managed Care – PPO | Admitting: Rehabilitative and Restorative Service Providers"

## 2011-05-15 ENCOUNTER — Ambulatory Visit: Payer: BC Managed Care – PPO | Admitting: Rehabilitative and Restorative Service Providers"

## 2011-05-20 ENCOUNTER — Encounter: Payer: BC Managed Care – PPO | Admitting: Rehabilitative and Restorative Service Providers"

## 2011-05-21 ENCOUNTER — Other Ambulatory Visit: Payer: Self-pay | Admitting: Family Medicine

## 2011-05-21 DIAGNOSIS — R109 Unspecified abdominal pain: Secondary | ICD-10-CM

## 2011-05-22 ENCOUNTER — Ambulatory Visit: Payer: BC Managed Care – PPO | Admitting: Rehabilitative and Restorative Service Providers"

## 2011-05-23 ENCOUNTER — Ambulatory Visit
Admission: RE | Admit: 2011-05-23 | Discharge: 2011-05-23 | Disposition: A | Discharge status: Home / Self Care | Payer: BC Managed Care – PPO | Source: Ambulatory Visit | Attending: Family Medicine | Admitting: Family Medicine

## 2011-05-23 DIAGNOSIS — R109 Unspecified abdominal pain: Secondary | ICD-10-CM

## 2011-05-23 MED ORDER — IOHEXOL 300 MG/ML  SOLN
100.0000 mL | Freq: Once | INTRAMUSCULAR | Status: AC | PRN
  Administered 2011-05-23: 100 mL via INTRAVENOUS

## 2011-05-28 ENCOUNTER — Encounter: Payer: BC Managed Care – PPO | Admitting: Rehabilitative and Restorative Service Providers"

## 2011-05-30 ENCOUNTER — Encounter: Payer: BC Managed Care – PPO | Admitting: Rehabilitative and Restorative Service Providers"

## 2011-06-03 ENCOUNTER — Encounter: Payer: BC Managed Care – PPO | Admitting: Rehabilitative and Restorative Service Providers"

## 2011-07-23 ENCOUNTER — Encounter: Payer: Self-pay | Admitting: Gastroenterology

## 2011-08-15 ENCOUNTER — Other Ambulatory Visit (INDEPENDENT_AMBULATORY_CARE_PROVIDER_SITE_OTHER): Payer: BC Managed Care – PPO

## 2011-08-15 ENCOUNTER — Encounter: Payer: Self-pay | Admitting: Gastroenterology

## 2011-08-15 ENCOUNTER — Ambulatory Visit (INDEPENDENT_AMBULATORY_CARE_PROVIDER_SITE_OTHER): Payer: BC Managed Care – PPO | Admitting: Gastroenterology

## 2011-08-15 VITALS — BP 114/68 | HR 100 | Ht 60.0 in | Wt 183.4 lb

## 2011-08-15 DIAGNOSIS — K660 Peritoneal adhesions (postprocedural) (postinfection): Secondary | ICD-10-CM

## 2011-08-15 DIAGNOSIS — R112 Nausea with vomiting, unspecified: Secondary | ICD-10-CM

## 2011-08-15 DIAGNOSIS — K5730 Diverticulosis of large intestine without perforation or abscess without bleeding: Secondary | ICD-10-CM

## 2011-08-15 DIAGNOSIS — F411 Generalized anxiety disorder: Secondary | ICD-10-CM

## 2011-08-15 DIAGNOSIS — F339 Major depressive disorder, recurrent, unspecified: Secondary | ICD-10-CM | POA: Insufficient documentation

## 2011-08-15 DIAGNOSIS — F419 Anxiety disorder, unspecified: Secondary | ICD-10-CM

## 2011-08-15 DIAGNOSIS — K219 Gastro-esophageal reflux disease without esophagitis: Secondary | ICD-10-CM

## 2011-08-15 LAB — BASIC METABOLIC PANEL
BUN: 37 mg/dL — ABNORMAL HIGH (ref 6–23)
CO2: 22 mEq/L (ref 19–32)
Calcium: 10.2 mg/dL (ref 8.4–10.5)
Chloride: 105 mEq/L (ref 96–112)
Creatinine, Ser: 1.3 mg/dL — ABNORMAL HIGH (ref 0.4–1.2)
GFR: 43.07 mL/min — ABNORMAL LOW (ref >60.00)
Glucose, Bld: 108 mg/dL — ABNORMAL HIGH (ref 70–99)
Potassium: 4.2 mEq/L (ref 3.5–5.1)
Sodium: 138 mEq/L (ref 135–145)

## 2011-08-15 LAB — VITAMIN B12: Vitamin B-12: 1071 pg/mL — ABNORMAL HIGH (ref 211–911)

## 2011-08-15 LAB — CBC WITH DIFFERENTIAL/PLATELET
Basophils Absolute: 0.1 10*3/uL (ref 0.0–0.1)
Basophils Relative: 0.7 % (ref 0.0–3.0)
Eosinophils Absolute: 0.3 10*3/uL (ref 0.0–0.7)
Eosinophils Relative: 3.1 % (ref 0.0–5.0)
HCT: 33.9 % — ABNORMAL LOW (ref 36.0–46.0)
Hemoglobin: 11.6 g/dL — ABNORMAL LOW (ref 12.0–15.0)
Lymphocytes Relative: 25.3 % (ref 12.0–46.0)
Lymphs Abs: 2.1 10*3/uL (ref 0.7–4.0)
MCHC: 34.1 g/dL (ref 30.0–36.0)
MCV: 85.8 fl (ref 78.0–100.0)
Monocytes Absolute: 0.6 10*3/uL (ref 0.1–1.0)
Monocytes Relative: 6.9 % (ref 3.0–12.0)
Neutro Abs: 5.4 10*3/uL (ref 1.4–7.7)
Neutrophils Relative %: 64 % (ref 43.0–77.0)
Platelets: 235 10*3/uL (ref 150.0–400.0)
RBC: 3.95 Mil/uL (ref 3.87–5.11)
RDW: 13.4 % (ref 11.5–14.6)
WBC: 8.4 10*3/uL (ref 4.5–10.5)

## 2011-08-15 LAB — FERRITIN: Ferritin: 88.7 ng/mL (ref 10.0–291.0)

## 2011-08-15 LAB — H. PYLORI ANTIBODY, IGG: H Pylori IgG: NEGATIVE

## 2011-08-15 LAB — HEPATIC FUNCTION PANEL
ALT: 20 U/L (ref 0–35)
AST: 22 U/L (ref 0–37)
Albumin: 4.6 g/dL (ref 3.5–5.2)
Alkaline Phosphatase: 78 U/L (ref 39–117)
Bilirubin, Direct: 0 mg/dL (ref 0.0–0.3)
Total Bilirubin: 0.5 mg/dL (ref 0.3–1.2)
Total Protein: 7.7 g/dL (ref 6.0–8.3)

## 2011-08-15 LAB — IBC PANEL
Iron: 70 ug/dL (ref 42–145)
Saturation Ratios: 17.5 % — ABNORMAL LOW (ref 20.0–50.0)
Transferrin: 286.4 mg/dL (ref 212.0–360.0)

## 2011-08-15 LAB — HIGH SENSITIVITY CRP: CRP, High Sensitivity: 8.26 mg/L — ABNORMAL HIGH (ref 0.000–5.000)

## 2011-08-15 LAB — AMYLASE: Amylase: 81 U/L (ref 27–131)

## 2011-08-15 LAB — TSH: TSH: 2.96 u[IU]/mL (ref 0.35–5.50)

## 2011-08-15 LAB — FOLATE: Folate: >24.8 ng/mL (ref >5.9)

## 2011-08-15 LAB — LIPASE: Lipase: 46 U/L (ref 11.0–59.0)

## 2011-08-15 MED ORDER — ALIGN PO CAPS: ORAL_CAPSULE | ORAL | Status: DC

## 2011-08-15 MED ORDER — RIFAXIMIN 550 MG PO TABS: 550.0000 mg | ORAL_TABLET | Freq: Two times a day (BID) | ORAL | Status: DC

## 2011-08-15 NOTE — Progress Notes (Signed)
History of Present Illness:  This is a complicated 65 year old Caucasian female with multiple psychotropic medications, under psychiatric care. She currently relates periodic episodes of rather severe regurgitation with burning substernal chest pain, followed by diarrhea, lasting one to 2 days and occurring every 3 weeks without precipitating or alleviating elements. She has had CT scan of the abdomen which is fairly unremarkable except for diverticulosis. She has had previous exploratory laparotomy for perforated appendix with a prolonged hospitalization in 2007. She complains of chronic abdominal gas, bloating, but no early satiety, anorexia or weight loss. There is no history of previous pancreatitis, hepatitis,NSAID, alcohol, or cigarette abuse. Family history is remarkable for cholelithiasis and several family members. Patient denies any specific hepatobiliary general medical problems. She is on Dexilant 60 mg a day without any noted improvement. His attempts at colonoscopy were unsuccessful because of a fixed sigmoid colon. Subsequent barium enema was unremarkable except for diverticulosis. She denies any current symptoms melena, hematochezia, fever chills. She has not been on recent antibiotics.  I have reviewed this patient's present history, medical and surgical past history, allergies and medications.     ROS: The remainder of the 10 point ROS is negative... he is rather marked diminished exercise tolerance, and apparently is in physical rehabilitation. Review of her labs showed a normal metabolic profile and a sedimentation rate of 33. The patient has multiple psychiatric problems and is on multiple psychotropic medications, and apparently is doing fairly well from that standpoint.  Past Medical History  Diagnosis Date  . Hiatal hernia   . Esophageal reflux   . Diverticulosis of colon (without mention of hemorrhage)   . Hypertension   . Osteopenia   . ADD (attention deficit disorder)   .  Anxiety   . Depression   . Back pain   . Breast cancer 2007  . Hypercholesterolemia   . Family history of malignant neoplasm of gastrointestinal tract   . Irritable bowel syndrome    Past Surgical History  Procedure Date  . Appendectomy   . Hernia repair   . Uterine fibroid surgery     reports that she has never smoked. She has never used smokeless tobacco. She reports that she drinks alcohol. She reports that she does not use illicit drugs. family history includes Breast cancer in her maternal aunt; Colon cancer in her father; Diabetes in her maternal aunt; Pancreatic cancer in her cousin; and Prostate cancer in her maternal grandfather. Allergies  Allergen Reactions  . Morphine     REACTION: paranoid       1Physical Exam: General well developed well nourished patient in no acute distress, appearing her stated age Eyes PERRLA, no icterus, fundoscopic exam per opthamologist Skin no lesions noted Neck supple, no adenopathy, no thyroid enlargement, no tenderness Chest clear to percussion and auscultation Heart no significant murmurs, gallops or rubs noted Abdomen no hepatosplenomegaly masses or tenderness, BS normal. Well-healed midline abdominal scar noted.. Extremities no acute joint lesions, edema, phlebitis or evidence of cellulitis. Neurologic patient oriented x 3, cranial nerves intact, no focal neurologic deficits noted. Psychological mental status normal and normal affect.  Assessment and plan: Periodic nausea and vomiting and acid reflux possibly related to intestinal adhesions from her previous perforated appendix. Other considerations would be atypical presentation of cholelithiasis, bacterial overgrowth syndrome related to adhesions, or idiopathic delayed gastric emptying. I have ordered repeat lab exam included liver function tests, upper abdominal ultrasound exam, endoscopy with propofol sedation and we will check for H. pylori, continue Dexilant,  and give a trial of  Xifaxan 550 mg twice a day for 2 weeks with daily probiotics. She is to continue her other medications per primary care and psychiatry.  Encounter Diagnosis  Name Primary?  . Nausea & vomiting Yes

## 2011-08-15 NOTE — Patient Instructions (Signed)
Your procedure has been scheduled for 08/28/2011 please follow the seperate instructions.  Please go to the basement today for your labs.  Your abdominal ultrasound is schedule for 08/22/2011 arrive at 8:45am [facility] have nothing to eat or drink after midnight.  Take Xifaxan one tablet by mouth twice a day for 10 days, rx sent to your pharmacy. While taking Xifaxan take the samples of Align once a day.

## 2011-08-16 ENCOUNTER — Encounter: Payer: Self-pay | Admitting: Gastroenterology

## 2011-08-22 ENCOUNTER — Other Ambulatory Visit: Payer: Self-pay | Admitting: Gastroenterology

## 2011-08-22 ENCOUNTER — Encounter: Payer: Self-pay | Admitting: Gastroenterology

## 2011-08-22 ENCOUNTER — Ambulatory Visit (HOSPITAL_COMMUNITY)
Admission: RE | Admit: 2011-08-22 | Discharge: 2011-08-22 | Disposition: A | Discharge status: Home / Self Care | Payer: BC Managed Care – PPO | Source: Ambulatory Visit | Attending: Gastroenterology | Admitting: Gastroenterology

## 2011-08-22 DIAGNOSIS — R112 Nausea with vomiting, unspecified: Secondary | ICD-10-CM | POA: Insufficient documentation

## 2011-08-22 MED ORDER — DEXLANSOPRAZOLE 60 MG PO CPDR: 60.0000 mg | DELAYED_RELEASE_CAPSULE | Freq: Every day | ORAL | Status: DC

## 2011-08-22 NOTE — Telephone Encounter (Signed)
rx sent

## 2011-08-22 NOTE — Telephone Encounter (Signed)
Error

## 2011-08-28 ENCOUNTER — Ambulatory Visit (AMBULATORY_SURGERY_CENTER): Payer: BC Managed Care – PPO | Admitting: Gastroenterology

## 2011-08-28 ENCOUNTER — Encounter: Payer: Self-pay | Admitting: Gastroenterology

## 2011-08-28 DIAGNOSIS — R112 Nausea with vomiting, unspecified: Secondary | ICD-10-CM

## 2011-08-28 DIAGNOSIS — K259 Gastric ulcer, unspecified as acute or chronic, without hemorrhage or perforation: Secondary | ICD-10-CM

## 2011-08-28 DIAGNOSIS — K219 Gastro-esophageal reflux disease without esophagitis: Secondary | ICD-10-CM

## 2011-08-28 MED ORDER — SODIUM CHLORIDE 0.9 % IV SOLN: 500.0000 mL | INTRAVENOUS | Status: DC

## 2011-08-28 NOTE — Patient Instructions (Signed)
Please review discharge instructions (blue and green sheets)  Follow gastroparesis diet- see information given  Continue daily PPI

## 2011-08-29 ENCOUNTER — Telehealth: Payer: Self-pay | Admitting: *Deleted

## 2011-08-29 NOTE — Telephone Encounter (Signed)
Follow up Call- Patient questions:  Do you have a fever, pain , or abdominal swelling? no Pain Score  0 *  Have you tolerated food without any problems? yes  Have you been able to return to your normal activities? yes  Do you have any questions about your discharge instructions: Diet   no Medications  no Follow up visit  yes, pt states that she has a form that is a yellow carbon copy that states that she has a appointment on Friday. Pt asked what time her appointment was. After looking at future appointments educated pt that there were no "future appointments" on our appointment desk for her. Pt states that she doesn't feel that she needs to come back for another appointment so that is fine with her.  Do you have questions or concerns about your Care? no  Actions: * If pain score is 4 or above: No action needed, pain <4.

## 2011-08-30 DIAGNOSIS — K259 Gastric ulcer, unspecified as acute or chronic, without hemorrhage or perforation: Secondary | ICD-10-CM

## 2011-08-30 LAB — HELICOBACTER PYLORI SCREEN-BIOPSY: UREASE: NEGATIVE

## 2011-09-06 LAB — CBC
HCT: 26.4 — ABNORMAL LOW
HCT: 30.9 — ABNORMAL LOW
HCT: 33.8 — ABNORMAL LOW
Hemoglobin: 10.6 — ABNORMAL LOW
Hemoglobin: 11.8 — ABNORMAL LOW
Hemoglobin: 8.9 — ABNORMAL LOW
MCHC: 33.7
MCHC: 34.4
MCHC: 35
MCV: 86.9
MCV: 87.2
MCV: 88.8
Platelets: 193
Platelets: 247
Platelets: 251
RBC: 2.98 — ABNORMAL LOW
RBC: 3.54 — ABNORMAL LOW
RBC: 3.89
RDW: 12.5
RDW: 13.1
RDW: 13.3
WBC: 6.6
WBC: 6.9
WBC: 8.8

## 2011-09-06 LAB — BASIC METABOLIC PANEL
BUN: 22
CO2: 26
Calcium: 9.4
Chloride: 103
Creatinine, Ser: 0.94
GFR calc Af Amer: >60
GFR calc non Af Amer: >60
Glucose, Bld: 122 — ABNORMAL HIGH
Potassium: 3.9
Sodium: 137

## 2011-09-23 ENCOUNTER — Encounter: Payer: Self-pay | Admitting: Gastroenterology

## 2011-09-27 ENCOUNTER — Telehealth: Payer: Self-pay | Admitting: Gastroenterology

## 2011-10-08 ENCOUNTER — Other Ambulatory Visit: Payer: Self-pay | Admitting: Oncology

## 2011-10-08 ENCOUNTER — Ambulatory Visit (HOSPITAL_BASED_OUTPATIENT_CLINIC_OR_DEPARTMENT_OTHER): Payer: BC Managed Care – PPO | Admitting: Lab

## 2011-10-08 DIAGNOSIS — Z853 Personal history of malignant neoplasm of breast: Secondary | ICD-10-CM

## 2011-10-08 DIAGNOSIS — C50419 Malignant neoplasm of upper-outer quadrant of unspecified female breast: Secondary | ICD-10-CM

## 2011-10-08 DIAGNOSIS — Z170 Estrogen receptor positive status [ER+]: Secondary | ICD-10-CM

## 2011-10-08 LAB — CBC WITH DIFFERENTIAL/PLATELET
BASO%: 0.5 % (ref 0.0–2.0)
Basophils Absolute: 0 10*3/uL (ref 0.0–0.1)
EOS%: 3.8 % (ref 0.0–7.0)
Eosinophils Absolute: 0.3 10*3/uL (ref 0.0–0.5)
HCT: 34 % — ABNORMAL LOW (ref 34.8–46.6)
HGB: 11.7 g/dL (ref 11.6–15.9)
LYMPH%: 23.7 % (ref 14.0–49.7)
MCH: 29.7 pg (ref 25.1–34.0)
MCHC: 34.4 g/dL (ref 31.5–36.0)
MCV: 86.3 fL (ref 79.5–101.0)
MONO#: 0.5 10*3/uL (ref 0.1–0.9)
MONO%: 6.3 % (ref 0.0–14.0)
NEUT#: 4.9 10*3/uL (ref 1.5–6.5)
NEUT%: 65.7 % (ref 38.4–76.8)
Platelets: 253 10*3/uL (ref 145–400)
RBC: 3.94 10*6/uL (ref 3.70–5.45)
RDW: 14.2 % (ref 11.2–14.5)
WBC: 7.4 10*3/uL (ref 3.9–10.3)
lymph#: 1.8 10*3/uL (ref 0.9–3.3)

## 2011-10-09 LAB — COMPREHENSIVE METABOLIC PANEL
ALT: 18 U/L (ref 0–35)
AST: 18 U/L (ref 0–37)
Albumin: 4.7 g/dL (ref 3.5–5.2)
Alkaline Phosphatase: 73 U/L (ref 39–117)
BUN: 26 mg/dL — ABNORMAL HIGH (ref 6–23)
CO2: 18 mEq/L — ABNORMAL LOW (ref 19–32)
Calcium: 9.7 mg/dL (ref 8.4–10.5)
Chloride: 107 mEq/L (ref 96–112)
Creatinine, Ser: 1.28 mg/dL — ABNORMAL HIGH (ref 0.50–1.10)
Glucose, Bld: 122 mg/dL — ABNORMAL HIGH (ref 70–99)
Potassium: 3.3 mEq/L — ABNORMAL LOW (ref 3.5–5.3)
Sodium: 137 mEq/L (ref 135–145)
Total Bilirubin: 0.3 mg/dL (ref 0.3–1.2)
Total Protein: 7 g/dL (ref 6.0–8.3)

## 2011-10-09 LAB — VITAMIN D 25 HYDROXY (VIT D DEFICIENCY, FRACTURES): Vit D, 25-Hydroxy: 46 ng/mL (ref 30–89)

## 2011-10-09 LAB — CANCER ANTIGEN 27.29: CA 27.29: 40 U/mL — ABNORMAL HIGH (ref 0–39)

## 2011-10-10 NOTE — Telephone Encounter (Signed)
This encounter was held open waiting for pt to call back with progress report. Spoke with pt today and she states she's doing ok. She has diarrhea, but it's not as bad; if it gets bad, she takes Imodium. Pt instructed to call for problems or questions; pt stated understanding.

## 2011-10-15 ENCOUNTER — Encounter: Payer: Self-pay | Admitting: *Deleted

## 2011-10-15 ENCOUNTER — Other Ambulatory Visit: Payer: Self-pay | Admitting: Oncology

## 2011-10-15 ENCOUNTER — Ambulatory Visit: Payer: BC Managed Care – PPO | Admitting: Oncology

## 2011-10-16 ENCOUNTER — Telehealth: Payer: Self-pay | Admitting: Oncology

## 2011-10-16 ENCOUNTER — Other Ambulatory Visit: Payer: Self-pay | Admitting: Oncology

## 2011-10-16 DIAGNOSIS — Z1231 Encounter for screening mammogram for malignant neoplasm of breast: Secondary | ICD-10-CM

## 2011-10-16 NOTE — Telephone Encounter (Signed)
called pt and r/s missed appt on 11/20 to 12/26

## 2011-11-05 ENCOUNTER — Other Ambulatory Visit: Payer: Self-pay | Admitting: Oncology

## 2011-11-05 DIAGNOSIS — Z853 Personal history of malignant neoplasm of breast: Secondary | ICD-10-CM

## 2011-11-15 ENCOUNTER — Ambulatory Visit
Admission: RE | Admit: 2011-11-15 | Discharge: 2011-11-15 | Disposition: A | Discharge status: Home / Self Care | Payer: BC Managed Care – PPO | Source: Ambulatory Visit | Attending: Oncology | Admitting: Oncology

## 2011-11-15 DIAGNOSIS — Z9889 Other specified postprocedural states: Secondary | ICD-10-CM

## 2011-11-15 DIAGNOSIS — Z1231 Encounter for screening mammogram for malignant neoplasm of breast: Secondary | ICD-10-CM

## 2011-11-20 ENCOUNTER — Ambulatory Visit (HOSPITAL_BASED_OUTPATIENT_CLINIC_OR_DEPARTMENT_OTHER): Payer: BC Managed Care – PPO | Admitting: Oncology

## 2011-11-20 ENCOUNTER — Telehealth: Payer: Self-pay | Admitting: *Deleted

## 2011-11-20 ENCOUNTER — Ambulatory Visit (HOSPITAL_BASED_OUTPATIENT_CLINIC_OR_DEPARTMENT_OTHER): Payer: BC Managed Care – PPO

## 2011-11-20 VITALS — BP 132/87 | HR 102 | Temp 98.5°F | Ht 60.5 in | Wt 173.3 lb

## 2011-11-20 DIAGNOSIS — Z170 Estrogen receptor positive status [ER+]: Secondary | ICD-10-CM

## 2011-11-20 DIAGNOSIS — Z79811 Long term (current) use of aromatase inhibitors: Secondary | ICD-10-CM

## 2011-11-20 DIAGNOSIS — Z853 Personal history of malignant neoplasm of breast: Secondary | ICD-10-CM

## 2011-11-20 DIAGNOSIS — C50919 Malignant neoplasm of unspecified site of unspecified female breast: Secondary | ICD-10-CM

## 2011-11-20 NOTE — Telephone Encounter (Signed)
waiting on the doctor to change the order to diagnostic gave the order back to the doctor on 11-20-2011

## 2011-11-20 NOTE — Progress Notes (Signed)
Hematology and Oncology Follow Up Visit  Gloria Fields [id number] [birth year] 65 y.o. 11/20/2011 9:58 AM PCP dr Brunilda Payor  Principle Diagnosis:  PROBLEM:  T1c N0 breast cancer status post lumpectomy 11/04/2006, status post radiation therapy completed 02/16/06, ER/PR positive, on Arimidex  Interim History:  There have been no intercurrent illness, hospitalizations or medication changes.She has been diagnosed with an ulcer which flares intermittently. She has stopped her abilify, because of how it made her feel. Recent mammogram was wnl  Medications: I have reviewed the patient's current medications.  Allergies:  Allergies  Allergen Reactions  . Morphine     REACTION: paranoid    Past Medical History, Surgical history, Social history, and Family History were reviewed and updated.  Review of Systems: Constitutional:  Negative for fever, chills, night sweats, anorexia, weight loss, pain. Cardiovascular: no chest pain or dyspnea on exertion Respiratory: no cough, shortness of breath, or wheezing Neurological: no TIA or stroke symptoms Dermatological: negative ENT: negative Skin Gastrointestinal: no abdominal pain, change in bowel habits, or black or bloody stools Genito-Urinary: no dysuria, trouble voiding, or hematuria Hematological and Lymphatic: negative Breast: negative for breast lumps Musculoskeletal: negative Remaining ROS negative.  Physical Exam: Blood pressure 132/87, pulse 102, temperature 98.5 F (36.9 C), temperature source Oral, height 5' 0.5" (1.537 m), weight 173 lb 5 oz (78.614 kg). ECOG: 0 General appearance: alert, cooperative and appears stated age Head: Normocephalic, without obvious abnormality, atraumatic Neck: no adenopathy, no carotid bruit, no JVD, supple, symmetrical, trachea midline and thyroid not enlarged, symmetric, no tenderness/mass/nodules Lymph nodes: Cervical, supraclavicular, and axillary nodes normal. Cardiac : regular rate and  rhythm, no murmurs or gallops Pulmonary:clear to auscultation bilaterally and normal percussion bilaterally Breasts: inspection negative, no nipple discharge or bleeding, no masses or nodularity palpable Abdomen:soft, non-tender; bowel sounds normal; no masses,  no organomegaly Extremities negative Neuro: alert, oriented, normal speech, no focal findings or movement disorder noted  Lab Results: Lab Results  Component Value Date   WBC 7.4 10/08/2011   HGB 11.7 10/08/2011   HCT 34.0* 10/08/2011   MCV 86.3 10/08/2011   PLT 253 10/08/2011     Chemistry      Component Value Date/Time   NA 137 10/08/2011 0955   NA 137 10/08/2011 0955   NA 137 10/08/2011 0955   K 3.3* 10/08/2011 0955   K 3.3* 10/08/2011 0955   K 3.3* 10/08/2011 0955   CL 107 10/08/2011 0955   CL 107 10/08/2011 0955   CL 107 10/08/2011 0955   CO2 18* 10/08/2011 0955   CO2 18* 10/08/2011 0955   CO2 18* 10/08/2011 0955   BUN 26* 10/08/2011 0955   BUN 26* 10/08/2011 0955   BUN 26* 10/08/2011 0955   CREATININE 1.28* 10/08/2011 0955   CREATININE 1.28* 10/08/2011 0955   CREATININE 1.28* 10/08/2011 0955      Component Value Date/Time   CALCIUM 9.7 10/08/2011 0955   CALCIUM 9.7 10/08/2011 0955   CALCIUM 9.7 10/08/2011 0955   ALKPHOS 73 10/08/2011 0955   ALKPHOS 73 10/08/2011 0955   ALKPHOS 73 10/08/2011 0955   AST 18 10/08/2011 0955   AST 18 10/08/2011 0955   AST 18 10/08/2011 0955   ALT 18 10/08/2011 0955   ALT 18 10/08/2011 0955   ALT 18 10/08/2011 0955   BILITOT 0.3 10/08/2011 0955   BILITOT 0.3 10/08/2011 0955   BILITOT 0.3 10/08/2011 0955      .pathology. Radiological Studies: chest X-ray n/a Mammogram Recent 12/12-wnl Bone density Pending  for 2013  Impression and Plan: Ms Grandmaison is doing well. She has completed 5 y of AI therapyy and will stop . I will see her in 1 yr with f/u labs and imaging.  More than 50% of the visit was spent in patient-related counselling   Pierce Crane,  MD 12/26/20129:58 AM

## 2011-12-03 ENCOUNTER — Telehealth: Payer: Self-pay | Admitting: *Deleted

## 2011-12-03 ENCOUNTER — Other Ambulatory Visit: Payer: Self-pay | Admitting: Oncology

## 2011-12-03 DIAGNOSIS — C50919 Malignant neoplasm of unspecified site of unspecified female breast: Secondary | ICD-10-CM

## 2011-12-03 NOTE — Telephone Encounter (Signed)
Third notice given on 12-03-2011

## 2011-12-12 ENCOUNTER — Telehealth: Payer: Self-pay | Admitting: *Deleted

## 2011-12-12 ENCOUNTER — Other Ambulatory Visit: Payer: Self-pay | Admitting: *Deleted

## 2011-12-12 DIAGNOSIS — K259 Gastric ulcer, unspecified as acute or chronic, without hemorrhage or perforation: Secondary | ICD-10-CM

## 2011-12-12 DIAGNOSIS — Z853 Personal history of malignant neoplasm of breast: Secondary | ICD-10-CM

## 2011-12-12 DIAGNOSIS — C50919 Malignant neoplasm of unspecified site of unspecified female breast: Secondary | ICD-10-CM

## 2011-12-12 NOTE — Telephone Encounter (Signed)
na

## 2011-12-13 NOTE — Telephone Encounter (Signed)
Na

## 2011-12-26 NOTE — Progress Notes (Signed)
na

## 2012-03-10 ENCOUNTER — Other Ambulatory Visit (HOSPITAL_COMMUNITY)
Admission: RE | Admit: 2012-03-10 | Discharge: 2012-03-10 | Disposition: A | Discharge status: Home / Self Care | Payer: BC Managed Care – PPO | Source: Ambulatory Visit | Attending: Family Medicine | Admitting: Family Medicine

## 2012-03-10 ENCOUNTER — Other Ambulatory Visit: Payer: Self-pay | Admitting: Physician Assistant

## 2012-03-10 DIAGNOSIS — Z124 Encounter for screening for malignant neoplasm of cervix: Secondary | ICD-10-CM | POA: Insufficient documentation

## 2012-03-31 ENCOUNTER — Other Ambulatory Visit (HOSPITAL_COMMUNITY)
Admission: RE | Admit: 2012-03-31 | Discharge: 2012-03-31 | Disposition: A | Discharge status: Home / Self Care | Payer: BC Managed Care – PPO | Source: Ambulatory Visit | Attending: Family Medicine | Admitting: Family Medicine

## 2012-03-31 ENCOUNTER — Other Ambulatory Visit: Payer: Self-pay | Admitting: Physician Assistant

## 2012-03-31 DIAGNOSIS — R87615 Unsatisfactory cytologic smear of cervix: Secondary | ICD-10-CM | POA: Insufficient documentation

## 2012-09-11 ENCOUNTER — Encounter (INDEPENDENT_AMBULATORY_CARE_PROVIDER_SITE_OTHER): Payer: Self-pay | Admitting: General Surgery

## 2012-10-15 ENCOUNTER — Telehealth (INDEPENDENT_AMBULATORY_CARE_PROVIDER_SITE_OTHER): Payer: Self-pay | Admitting: General Surgery

## 2012-10-15 ENCOUNTER — Encounter (INDEPENDENT_AMBULATORY_CARE_PROVIDER_SITE_OTHER): Payer: Self-pay | Admitting: Surgery

## 2012-10-15 ENCOUNTER — Ambulatory Visit (INDEPENDENT_AMBULATORY_CARE_PROVIDER_SITE_OTHER): Payer: Medicare Other | Admitting: Surgery

## 2012-10-15 VITALS — BP 128/78 | HR 72 | Temp 97.0°F | Resp 18 | Ht 60.05 in | Wt 162.0 lb

## 2012-10-15 DIAGNOSIS — K432 Incisional hernia without obstruction or gangrene: Secondary | ICD-10-CM

## 2012-10-15 NOTE — Progress Notes (Signed)
Chief Complaint:  Recurrent upper midline ventral hernia  History of Present Illness:  Gloria Fields is an 66 y.o. female who underwent an upper midline ventral hernia repair by me in September of 2008. This was done using proceed mesh placed anteriorly. I had operated on her in March 2007 for a radical peritoneal debridement for a ruptured appendicitis and her hernia resulted from that intervention.  She reports some discomfort in her upper abdomen. The hernia is not as big as the one before but I can feel a couple of areas that we and may represent proceed mesh failure. I will plan to approach this laparoscopically tried again in and take down the hernia and then probably combine this with an open approach. I explained this to her. She wants to proceed with surgery.  She also asked me to examine her foot she has a bunion. I referred her to Dr. Hewitt or Dr. Bednarz because she has an element of plantar fasciitis I think along with that.  Past Medical History  Diagnosis Date  . Hiatal hernia   . Esophageal reflux   . Diverticulosis of colon (without mention of hemorrhage)   . Hypertension   . Osteopenia   . ADD (attention deficit disorder)   . Anxiety   . Depression   . Back pain   . Breast cancer 2007  . Hypercholesterolemia   . Family history of malignant neoplasm of gastrointestinal tract   . Irritable bowel syndrome     Past Surgical History  Procedure Date  . Appendectomy   . Hernia repair   . Uterine fibroid surgery   . Bowel perforation 2007  . Breast lumpectomy 2007    Current Outpatient Prescriptions  Medication Sig Dispense Refill  . amphetamine-dextroamphetamine (ADDERALL) 10 MG tablet Take 20 mg by mouth 2 (two) times daily.       . anastrozole (ARIMIDEX) 1 MG tablet TAKE 1 TABLET BY MOUTH EVERY DAY  30 tablet  4  . ARIPiprazole (ABILIFY) 2 MG tablet Take 1 mg by mouth daily.        . b complex vitamins tablet Take 1 tablet by mouth daily.        .  bifidobacterium infantis (ALIGN) capsule Take one a tablet by mouth once a day while on Xifaxan.  14 capsule  0  . Bioflavonoid Products (PERIDRIN-C) 200-50-150 MG TABS Take 1 capsule by mouth 2 (two) times daily.        . Calcium Carbonate-Vitamin D (CALTRATE 600+D) 600-400 MG-UNIT per tablet Take 1 tablet by mouth daily.        . clonazePAM (KLONOPIN) 1 MG tablet Take 1 mg by mouth 4 (four) times daily as needed.        . dexlansoprazole (DEXILANT) 60 MG capsule Take 1 capsule (60 mg total) by mouth daily.  30 capsule  6  . Eszopiclone (ESZOPICLONE) 3 MG TABS Take 3 mg by mouth at bedtime. Take immediately before bedtime       . megestrol (MEGACE) 20 MG tablet Take 10 mg by mouth 2 (two) times daily.       . Multiple Vitamins-Minerals (CENTRUM SILVER PO) Take 1 capsule by mouth daily.        . omega-3 fish oil (MAXEPA) 1000 MG CAPS capsule Take 1 capsule by mouth 2 (two) times daily.        . omeprazole (PRILOSEC) 20 MG capsule       . ondansetron (ZOFRAN) 4 MG tablet Take 4   mg by mouth every 8 (eight) hours as needed.      . rifaximin (XIFAXAN) 550 MG TABS Take 1 tablet (550 mg total) by mouth 2 (two) times daily.  20 tablet  0  . sertraline (ZOLOFT) 100 MG tablet Take 100 mg by mouth daily.       . valsartan-hydrochlorothiazide (DIOVAN HCT) 160-25 MG per tablet Take 1 tablet by mouth daily.        . vitamin E 100 UNIT capsule Take 100 Units by mouth daily.       Morphine Family History  Problem Relation Age of Onset  . Colon cancer Father     ?  . Pancreatic cancer Cousin   . Diabetes Maternal Aunt   . Prostate cancer Maternal Grandfather   . Breast cancer Maternal Aunt     X 3   Social History:   reports that she has never smoked. She has never used smokeless tobacco. She reports that she drinks alcohol. She reports that she does not use illicit drugs.   REVIEW OF SYSTEMS - PERTINENT POSITIVES ONLY: No history of DVT  Physical Exam:   Blood pressure 128/78, pulse 72,  temperature 97 F (36.1 C), temperature source Oral, resp. rate 18, height 5' 0.05" (1.525 m), weight 162 lb (73.483 kg). Body mass index is 31.59 kg/(m^2).  Gen:  WDWN white female NAD  Neurological: Alert and oriented to person, place, and time. Motor and sensory function is grossly intact  Head: Normocephalic and atraumatic.  Eyes: Conjunctivae are normal. Pupils are equal, round, and reactive to light. No scleral icterus.  Neck: Normal range of motion. Neck supple. No tracheal deviation or thyromegaly present.  Cardiovascular:  SR without murmurs or gallops.  No carotid bruits Respiratory: Effort normal.  No respiratory distress. No chest wall tenderness. Breath sounds normal.  No wheezes, rales or rhonchi.  Abdomen:  Multiple soft weaknesses in the upper area of her midline incision. Reducible in the supine position. GU: Musculoskeletal: Normal range of motion. Extremities are nontender. No cyanosis, edema or clubbing noted Lymphadenopathy: No cervical, preauricular, postauricular or axillary adenopathy is present Skin: Skin is warm and dry. No rash noted. No diaphoresis. No erythema. No pallor. Pscyh: Normal mood and affect. Behavior is normal. Judgment and thought content normal.   LABORATORY RESULTS: No results found for this or any previous visit (from the past 48 hour(s)).  RADIOLOGY RESULTS: No results found.  Problem List: Patient Active Problem List  Diagnosis  . Nausea & vomiting  . GERD (gastroesophageal reflux disease)  . Abdominal adhesions  . Depression, major, recurrent  . Anxiety disorder  . Diverticulosis of colon (without mention of hemorrhage)  . Nausea with vomiting  . Esophageal reflux  . Pyloric channel ulcer  . Recurrent ventral hernia    Assessment & Plan: Although I anticipate some degree of adhesions from her prior peritonitis. I will attempt a laparoscopic approach but may fall back on a open approach. We'll plan to do a bowel prep  preoperatively.    Matt B. Beyza Bellino, MD, FACS  [facility] [phone] beeper [phone]  10/15/2012 10:09 AM     

## 2012-10-15 NOTE — Telephone Encounter (Signed)
LMOM letting pt know that her first PO appt will be on 12/19 at 11:40

## 2012-10-15 NOTE — Patient Instructions (Addendum)
See Dr. Victorino Dike or Lestine Box at [facility] regarding you bunion and arch pain.

## 2012-10-19 ENCOUNTER — Encounter (HOSPITAL_COMMUNITY): Payer: Self-pay | Admitting: Pharmacy Technician

## 2012-10-21 ENCOUNTER — Other Ambulatory Visit (HOSPITAL_COMMUNITY): Payer: Self-pay | Admitting: *Deleted

## 2012-10-26 ENCOUNTER — Encounter (HOSPITAL_COMMUNITY)
Admission: RE | Admit: 2012-10-26 | Discharge: 2012-10-26 | Disposition: A | Discharge status: Home / Self Care | Payer: Medicare Other | Source: Ambulatory Visit | Attending: Surgery | Admitting: Surgery

## 2012-10-26 ENCOUNTER — Ambulatory Visit (HOSPITAL_COMMUNITY)
Admission: RE | Admit: 2012-10-26 | Discharge: 2012-10-26 | Disposition: A | Discharge status: Home / Self Care | Payer: Medicare Other | Source: Ambulatory Visit | Attending: Surgery | Admitting: Surgery

## 2012-10-26 ENCOUNTER — Encounter (HOSPITAL_COMMUNITY): Payer: Self-pay

## 2012-10-26 DIAGNOSIS — Z01818 Encounter for other preprocedural examination: Secondary | ICD-10-CM | POA: Insufficient documentation

## 2012-10-26 DIAGNOSIS — Z853 Personal history of malignant neoplasm of breast: Secondary | ICD-10-CM | POA: Insufficient documentation

## 2012-10-26 DIAGNOSIS — I10 Essential (primary) hypertension: Secondary | ICD-10-CM | POA: Insufficient documentation

## 2012-10-26 DIAGNOSIS — K439 Ventral hernia without obstruction or gangrene: Secondary | ICD-10-CM | POA: Insufficient documentation

## 2012-10-26 HISTORY — DX: Nausea with vomiting, unspecified: R11.2

## 2012-10-26 HISTORY — DX: Other specified postprocedural states: Z98.890

## 2012-10-26 LAB — CBC
HCT: 33.8 % — ABNORMAL LOW (ref 36.0–46.0)
Hemoglobin: 11.5 g/dL — ABNORMAL LOW (ref 12.0–15.0)
MCH: 29.4 pg (ref 26.0–34.0)
MCHC: 34 g/dL (ref 30.0–36.0)
MCV: 86.4 fL (ref 78.0–100.0)
Platelets: 253 10*3/uL (ref 150–400)
RBC: 3.91 MIL/uL (ref 3.87–5.11)
RDW: 13.4 % (ref 11.5–15.5)
WBC: 9.5 10*3/uL (ref 4.0–10.5)

## 2012-10-26 LAB — BASIC METABOLIC PANEL
BUN: 48 mg/dL — ABNORMAL HIGH (ref 6–23)
CO2: 25 mEq/L (ref 19–32)
Calcium: 9.8 mg/dL (ref 8.4–10.5)
Chloride: 99 mEq/L (ref 96–112)
Creatinine, Ser: 1.93 mg/dL — ABNORMAL HIGH (ref 0.50–1.10)
GFR calc Af Amer: 30 mL/min — ABNORMAL LOW (ref >90)
GFR calc non Af Amer: 26 mL/min — ABNORMAL LOW (ref >90)
Glucose, Bld: 116 mg/dL — ABNORMAL HIGH (ref 70–99)
Potassium: 3.2 mEq/L — ABNORMAL LOW (ref 3.5–5.1)
Sodium: 138 mEq/L (ref 135–145)

## 2012-10-26 LAB — SURGICAL PCR SCREEN
MRSA, PCR: NEGATIVE
Staphylococcus aureus: NEGATIVE

## 2012-10-26 NOTE — Patient Instructions (Signed)
20      Your procedure is scheduled on:  Thursday 10/29/2012 t 0730 am  Report to [facility] at  0530 AM.  Call this number if you have problems the morning of surgery: [phone]   Remember:   Do not eat food or drink liquids after midnight!  Take these medicines the morning of surgery with A SIP OF WATER: Dexilant, Zoloft   Do not bring valuables to the hospital.  .  Leave suitcase in the car. After surgery it may be brought to your room.  For patients admitted to the hospital, checkout time is 11:00 AM the day of              Discharge.    Special Instructions: See [facility] Preparing  For Surgery Instruction Sheet. Do not wear jewelry, lotions powders, perfumes. Women do not shave legs or underarms for 12 hours before showers. Contacts, partial plates,              or dentures may not be worn into surgery.                          Patients discharged the day of surgery will not be allowed to drive home. If going home the same day of surgery, must have someone stay with you first 24 hrs.at home and arrange for someone to drive you home from the Hospital.             YOUR DRIVER IS: [place] L[place]-friend   Please read over the following fact sheets that you were given: MRSA INFORMATION,INCENTIVE SPIROMETRY SHEET, SLEEP APNEA SHEET                            Telford Nab.Dontasia Miranda,RN,BSN     [phone]

## 2012-10-28 ENCOUNTER — Telehealth (INDEPENDENT_AMBULATORY_CARE_PROVIDER_SITE_OTHER): Payer: Self-pay | Admitting: General Surgery

## 2012-10-28 NOTE — Progress Notes (Signed)
Patient notified of time change for surgery- informed patient to report to Short Stay at 0700 am 10/29/2012

## 2012-10-28 NOTE — Telephone Encounter (Signed)
Message copied by Littie Deeds on Wed Oct 28, 2012  9:53 AM ------      Message from: Docia Chuck      Created: Wed Oct 28, 2012  7:53 AM      Regarding: Please call pt        She had questions about what she is to take today before surgery tomorrow.  Please call as soon as possible.

## 2012-10-28 NOTE — Telephone Encounter (Signed)
Pt had questions because pre-op told the patient that she needed to do a fleets enema before her surgery.  I explained that she did not need to do this.

## 2012-10-29 ENCOUNTER — Encounter (HOSPITAL_COMMUNITY): Payer: Self-pay | Admitting: *Deleted

## 2012-10-29 ENCOUNTER — Encounter (HOSPITAL_COMMUNITY)
Admission: RE | Disposition: A | Discharge status: Home / Self Care | Payer: Self-pay | Source: Ambulatory Visit | Attending: Surgery

## 2012-10-29 ENCOUNTER — Encounter (HOSPITAL_COMMUNITY): Payer: Self-pay

## 2012-10-29 ENCOUNTER — Inpatient Hospital Stay (HOSPITAL_COMMUNITY)
Admission: RE | Admit: 2012-10-29 | Discharge: 2012-11-03 | DRG: 336 | Disposition: A | Discharge status: Home / Self Care | Payer: Medicare Other | Source: Ambulatory Visit | Attending: Surgery | Admitting: Surgery

## 2012-10-29 ENCOUNTER — Ambulatory Visit (HOSPITAL_COMMUNITY): Payer: Medicare Other | Admitting: *Deleted

## 2012-10-29 DIAGNOSIS — K259 Gastric ulcer, unspecified as acute or chronic, without hemorrhage or perforation: Secondary | ICD-10-CM | POA: Diagnosis present

## 2012-10-29 DIAGNOSIS — F411 Generalized anxiety disorder: Secondary | ICD-10-CM | POA: Diagnosis present

## 2012-10-29 DIAGNOSIS — E7800 Pure hypercholesterolemia, unspecified: Secondary | ICD-10-CM | POA: Diagnosis present

## 2012-10-29 DIAGNOSIS — K660 Peritoneal adhesions (postprocedural) (postinfection): Principal | ICD-10-CM | POA: Diagnosis present

## 2012-10-29 DIAGNOSIS — K432 Incisional hernia without obstruction or gangrene: Secondary | ICD-10-CM | POA: Diagnosis present

## 2012-10-29 DIAGNOSIS — K560 Paralytic ileus: Secondary | ICD-10-CM | POA: Diagnosis not present

## 2012-10-29 DIAGNOSIS — F339 Major depressive disorder, recurrent, unspecified: Secondary | ICD-10-CM | POA: Diagnosis present

## 2012-10-29 DIAGNOSIS — Z5331 Laparoscopic surgical procedure converted to open procedure: Secondary | ICD-10-CM

## 2012-10-29 DIAGNOSIS — I10 Essential (primary) hypertension: Secondary | ICD-10-CM | POA: Diagnosis present

## 2012-10-29 DIAGNOSIS — T85898A Other specified complication of other internal prosthetic devices, implants and grafts, initial encounter: Secondary | ICD-10-CM

## 2012-10-29 DIAGNOSIS — K219 Gastro-esophageal reflux disease without esophagitis: Secondary | ICD-10-CM | POA: Diagnosis present

## 2012-10-29 DIAGNOSIS — K589 Irritable bowel syndrome without diarrhea: Secondary | ICD-10-CM | POA: Diagnosis present

## 2012-10-29 DIAGNOSIS — K430 Incisional hernia with obstruction, without gangrene: Secondary | ICD-10-CM | POA: Diagnosis present

## 2012-10-29 DIAGNOSIS — F988 Other specified behavioral and emotional disorders with onset usually occurring in childhood and adolescence: Secondary | ICD-10-CM | POA: Diagnosis present

## 2012-10-29 HISTORY — PX: VENTRAL HERNIA REPAIR: SHX424

## 2012-10-29 HISTORY — PX: LAPAROSCOPIC LYSIS OF ADHESIONS: SHX5905

## 2012-10-29 LAB — CBC
HCT: 30.4 % — ABNORMAL LOW (ref 36.0–46.0)
Hemoglobin: 10.2 g/dL — ABNORMAL LOW (ref 12.0–15.0)
MCH: 29.1 pg (ref 26.0–34.0)
MCHC: 33.6 g/dL (ref 30.0–36.0)
MCV: 86.6 fL (ref 78.0–100.0)
Platelets: 222 10*3/uL (ref 150–400)
RBC: 3.51 MIL/uL — ABNORMAL LOW (ref 3.87–5.11)
RDW: 13.3 % (ref 11.5–15.5)
WBC: 17.3 10*3/uL — ABNORMAL HIGH (ref 4.0–10.5)

## 2012-10-29 LAB — CREATININE, SERUM
Creatinine, Ser: 1.2 mg/dL — ABNORMAL HIGH (ref 0.50–1.10)
GFR calc Af Amer: 54 mL/min — ABNORMAL LOW (ref >90)
GFR calc non Af Amer: 46 mL/min — ABNORMAL LOW (ref >90)

## 2012-10-29 SURGERY — REPAIR, HERNIA, VENTRAL, LAPAROSCOPIC
Anesthesia: General | Site: Abdomen | Wound class: Clean

## 2012-10-29 MED ORDER — EPHEDRINE SULFATE 50 MG/ML IJ SOLN
INTRAMUSCULAR | Status: DC | PRN
  Administered 2012-10-29: 5 mg via INTRAVENOUS
  Administered 2012-10-29: 10 mg via INTRAVENOUS

## 2012-10-29 MED ORDER — HYDROMORPHONE HCL PF 1 MG/ML IJ SOLN
1.0000 mg | INTRAMUSCULAR | Status: DC | PRN
  Administered 2012-10-29 – 2012-11-01 (×11): 1 mg via INTRAVENOUS
  Filled 2012-10-29 (×11): qty 1

## 2012-10-29 MED ORDER — 0.9 % SODIUM CHLORIDE (POUR BTL) OPTIME
TOPICAL | Status: DC | PRN
  Administered 2012-10-29: 1000 mL

## 2012-10-29 MED ORDER — CLONAZEPAM 1 MG PO TABS
1.0000 mg | ORAL_TABLET | Freq: Four times a day (QID) | ORAL | Status: DC | PRN
  Administered 2012-11-01 – 2012-11-02 (×3): 1 mg via ORAL
  Filled 2012-10-29 (×4): qty 1

## 2012-10-29 MED ORDER — FENTANYL CITRATE 0.05 MG/ML IJ SOLN
INTRAMUSCULAR | Status: DC | PRN
Start: 2012-10-29 — End: 2012-10-29
  Administered 2012-10-29 (×8): 50 ug via INTRAVENOUS

## 2012-10-29 MED ORDER — ACETAMINOPHEN 10 MG/ML IV SOLN
1000.0000 mg | Freq: Four times a day (QID) | INTRAVENOUS | Status: AC
  Administered 2012-10-29 – 2012-10-30 (×4): 1000 mg via INTRAVENOUS
  Filled 2012-10-29 (×6): qty 100

## 2012-10-29 MED ORDER — LACTATED RINGERS IV SOLN
INTRAVENOUS | Status: DC | PRN
  Administered 2012-10-29: 09:00:00 via INTRAVENOUS

## 2012-10-29 MED ORDER — MIDAZOLAM HCL 5 MG/5ML IJ SOLN
INTRAMUSCULAR | Status: DC | PRN
  Administered 2012-10-29: .5 mg via INTRAVENOUS

## 2012-10-29 MED ORDER — RINGERS IRRIGATION IR SOLN
Status: DC | PRN
  Administered 2012-10-29: 1000 mL

## 2012-10-29 MED ORDER — BUPIVACAINE LIPOSOME 1.3 % IJ SUSP
20.0000 mL | Freq: Once | INTRAMUSCULAR | Status: AC
  Administered 2012-10-29: 20 mL
  Filled 2012-10-29: qty 20

## 2012-10-29 MED ORDER — DEXAMETHASONE SODIUM PHOSPHATE 10 MG/ML IJ SOLN
INTRAMUSCULAR | Status: DC | PRN
  Administered 2012-10-29: 10 mg via INTRAVENOUS

## 2012-10-29 MED ORDER — PROPOFOL 10 MG/ML IV EMUL
INTRAVENOUS | Status: DC | PRN
  Administered 2012-10-29: 150 mg via INTRAVENOUS
  Administered 2012-10-29: 50 mg via INTRAVENOUS

## 2012-10-29 MED ORDER — PROMETHAZINE HCL 25 MG/ML IJ SOLN: 6.2500 mg | INTRAMUSCULAR | Status: DC | PRN

## 2012-10-29 MED ORDER — HYDROMORPHONE HCL PF 1 MG/ML IJ SOLN
0.2500 mg | INTRAMUSCULAR | Status: DC | PRN
  Administered 2012-10-29: 0.5 mg via INTRAVENOUS
  Administered 2012-10-29: 0.25 mg via INTRAVENOUS
  Administered 2012-10-29: 0.5 mg via INTRAVENOUS

## 2012-10-29 MED ORDER — ONDANSETRON HCL 4 MG PO TABS
4.0000 mg | ORAL_TABLET | Freq: Four times a day (QID) | ORAL | Status: DC | PRN
  Administered 2012-11-01 – 2012-11-02 (×3): 4 mg via ORAL
  Filled 2012-10-29 (×3): qty 1

## 2012-10-29 MED ORDER — LIDOCAINE HCL (CARDIAC) 20 MG/ML IV SOLN
INTRAVENOUS | Status: DC | PRN
  Administered 2012-10-29: 50 mg via INTRAVENOUS

## 2012-10-29 MED ORDER — CEFOXITIN SODIUM-DEXTROSE 1-4 GM-% IV SOLR (PREMIX)
INTRAVENOUS | Status: AC
  Filled 2012-10-29: qty 100

## 2012-10-29 MED ORDER — SODIUM CHLORIDE 0.9 % IV SOLN
INTRAVENOUS | Status: DC | PRN
  Administered 2012-10-29: 10:00:00 via INTRAVENOUS

## 2012-10-29 MED ORDER — PANTOPRAZOLE SODIUM 40 MG IV SOLR
40.0000 mg | INTRAVENOUS | Status: AC
  Administered 2012-10-29: 40 mg via INTRAVENOUS
  Filled 2012-10-29: qty 40

## 2012-10-29 MED ORDER — ONDANSETRON HCL 4 MG/2ML IJ SOLN
4.0000 mg | Freq: Four times a day (QID) | INTRAMUSCULAR | Status: DC | PRN
  Administered 2012-10-31 – 2012-11-01 (×2): 4 mg via INTRAVENOUS
  Filled 2012-10-29 (×2): qty 2

## 2012-10-29 MED ORDER — HEPARIN SODIUM (PORCINE) 5000 UNIT/ML IJ SOLN
5000.0000 [IU] | Freq: Once | INTRAMUSCULAR | Status: AC
  Administered 2012-10-29: 5000 [IU] via SUBCUTANEOUS
  Filled 2012-10-29: qty 1

## 2012-10-29 MED ORDER — KCL IN DEXTROSE-NACL 20-5-0.45 MEQ/L-%-% IV SOLN
INTRAVENOUS | Status: DC
  Administered 2012-10-29 – 2012-11-01 (×6): via INTRAVENOUS
  Filled 2012-10-29 (×8): qty 1000

## 2012-10-29 MED ORDER — DEXTROSE 5 % IV SOLN
2.0000 g | INTRAVENOUS | Status: AC
  Administered 2012-10-29: 2 g via INTRAVENOUS

## 2012-10-29 MED ORDER — ACETAMINOPHEN 10 MG/ML IV SOLN
INTRAVENOUS | Status: AC
  Filled 2012-10-29: qty 100

## 2012-10-29 MED ORDER — GLYCOPYRROLATE 0.2 MG/ML IJ SOLN
INTRAMUSCULAR | Status: DC | PRN
  Administered 2012-10-29: .8 mg via INTRAVENOUS

## 2012-10-29 MED ORDER — KETOROLAC TROMETHAMINE 30 MG/ML IJ SOLN: 15.0000 mg | Freq: Once | INTRAMUSCULAR | Status: DC | PRN

## 2012-10-29 MED ORDER — CISATRACURIUM BESYLATE (PF) 10 MG/5ML IV SOLN
INTRAVENOUS | Status: DC | PRN
  Administered 2012-10-29 (×2): 2 mg via INTRAVENOUS
  Administered 2012-10-29: 8 mg via INTRAVENOUS
  Administered 2012-10-29: 2 mg via INTRAVENOUS

## 2012-10-29 MED ORDER — SUCCINYLCHOLINE CHLORIDE 20 MG/ML IJ SOLN
INTRAMUSCULAR | Status: DC | PRN
  Administered 2012-10-29: 100 mg via INTRAVENOUS

## 2012-10-29 MED ORDER — HEPARIN SODIUM (PORCINE) 5000 UNIT/ML IJ SOLN
5000.0000 [IU] | Freq: Three times a day (TID) | INTRAMUSCULAR | Status: DC
  Administered 2012-10-29 – 2012-11-03 (×14): 5000 [IU] via SUBCUTANEOUS
  Filled 2012-10-29 (×17): qty 1

## 2012-10-29 MED ORDER — ACETAMINOPHEN 10 MG/ML IV SOLN
INTRAVENOUS | Status: DC | PRN
  Administered 2012-10-29: 1000 mg via INTRAVENOUS

## 2012-10-29 MED ORDER — ONDANSETRON HCL 4 MG/2ML IJ SOLN
INTRAMUSCULAR | Status: DC | PRN
  Administered 2012-10-29 (×2): 2 mg via INTRAVENOUS

## 2012-10-29 MED ORDER — NEOSTIGMINE METHYLSULFATE 1 MG/ML IJ SOLN
INTRAMUSCULAR | Status: DC | PRN
  Administered 2012-10-29: 4 mg via INTRAVENOUS

## 2012-10-29 MED ORDER — PANTOPRAZOLE SODIUM 40 MG IV SOLR
40.0000 mg | INTRAVENOUS | Status: DC
  Administered 2012-10-30 – 2012-11-01 (×3): 40 mg via INTRAVENOUS
  Filled 2012-10-29 (×3): qty 40

## 2012-10-29 MED ORDER — HYDROMORPHONE HCL PF 1 MG/ML IJ SOLN
INTRAMUSCULAR | Status: AC
  Filled 2012-10-29: qty 1

## 2012-10-29 SURGICAL SUPPLY — 56 items
APL SKNCLS STERI-STRIP NONHPOA (GAUZE/BANDAGES/DRESSINGS) ×2
BENZOIN TINCTURE PRP APPL 2/3 (GAUZE/BANDAGES/DRESSINGS) ×3 IMPLANT
BINDER ABD UNIV 12 45-62 (WOUND CARE) IMPLANT
BINDER ABDOMINAL 46IN 62IN (WOUND CARE)
CANISTER SUCTION 2500CC (MISCELLANEOUS) ×3 IMPLANT
CANNULA ENDOPATH XCEL 11M (ENDOMECHANICALS) IMPLANT
CLOTH BEACON ORANGE TIMEOUT ST (SAFETY) ×3 IMPLANT
COVER SURGICAL LIGHT HANDLE (MISCELLANEOUS) ×3 IMPLANT
DECANTER SPIKE VIAL GLASS SM (MISCELLANEOUS) ×3 IMPLANT
DEVICE SECURE STRAP 25 ABSORB (INSTRUMENTS) IMPLANT
DEVICE TROCAR PUNCTURE CLOSURE (ENDOMECHANICALS) ×3 IMPLANT
DISSECTOR BLUNT TIP ENDO 5MM (MISCELLANEOUS) IMPLANT
DRAIN CHANNEL RND F F (WOUND CARE) IMPLANT
DRAPE LAPAROSCOPIC ABDOMINAL (DRAPES) ×3 IMPLANT
ELECT REM PT RETURN 9FT ADLT (ELECTROSURGICAL) ×3
ELECTRODE REM PT RTRN 9FT ADLT (ELECTROSURGICAL) ×2 IMPLANT
EVACUATOR SILICONE 100CC (DRAIN) IMPLANT
GLOVE BIOGEL M 8.0 STRL (GLOVE) ×3 IMPLANT
GLOVE BIOGEL PI IND STRL 7.0 (GLOVE) ×2 IMPLANT
GLOVE BIOGEL PI INDICATOR 7.0 (GLOVE) ×1
GOWN STRL NON-REIN LRG LVL3 (GOWN DISPOSABLE) ×3 IMPLANT
GOWN STRL REIN XL XLG (GOWN DISPOSABLE) ×6 IMPLANT
HAND ACTIVATED (MISCELLANEOUS) ×2 IMPLANT
IV LACTATED RINGER IRRG 3000ML (IV SOLUTION)
IV LR IRRIG 3000ML ARTHROMATIC (IV SOLUTION) ×2 IMPLANT
KIT BASIN OR (CUSTOM PROCEDURE TRAY) ×3 IMPLANT
MARKER SKIN DUAL TIP RULER LAB (MISCELLANEOUS) ×3 IMPLANT
NDL SPNL 22GX3.5 QUINCKE BK (NEEDLE) ×2 IMPLANT
NEEDLE SPNL 22GX3.5 QUINCKE BK (NEEDLE) ×3 IMPLANT
NS IRRIG 1000ML POUR BTL (IV SOLUTION) ×3 IMPLANT
PENCIL BUTTON HOLSTER BLD 10FT (ELECTRODE) IMPLANT
SET IRRIG TUBING LAPAROSCOPIC (IRRIGATION / IRRIGATOR) IMPLANT
SLEEVE XCEL OPT CAN 5 100 (ENDOMECHANICALS) ×4 IMPLANT
SLEEVE Z-THREAD 5X100MM (TROCAR) ×2 IMPLANT
SOLUTION ANTI FOG 6CC (MISCELLANEOUS) ×3 IMPLANT
SPONGE GAUZE 4X4 12PLY (GAUZE/BANDAGES/DRESSINGS) ×1 IMPLANT
STAPLER VISISTAT 35W (STAPLE) IMPLANT
STRIP CLOSURE SKIN 1/2X4 (GAUZE/BANDAGES/DRESSINGS) ×6 IMPLANT
SUT NOVA 0 T19/GS 22DT (SUTURE) IMPLANT
SUT NOVA 1 T20/GS 25DT (SUTURE) IMPLANT
SUT NOVA NAB DX-16 0-1 5-0 T12 (SUTURE) IMPLANT
SUT PROLENE 0 CT 1 CR/8 (SUTURE) IMPLANT
SUT VIC AB 4-0 SH 18 (SUTURE) ×3 IMPLANT
SYR 30ML LL (SYRINGE) ×3 IMPLANT
TACKER 5MM HERNIA 3.5CML NAB (ENDOMECHANICALS) ×2 IMPLANT
TAPE CLOTH SURG 4X10 WHT LF (GAUZE/BANDAGES/DRESSINGS) ×1 IMPLANT
TRAY FOLEY CATH 14FRSI W/METER (CATHETERS) ×3 IMPLANT
TRAY LAP CHOLE (CUSTOM PROCEDURE TRAY) ×3 IMPLANT
TROCAR BLADELESS OPT 5 100 (ENDOMECHANICALS) ×3 IMPLANT
TROCAR HASSON GELL 12X100 (TROCAR) IMPLANT
TROCAR XCEL 12X100 BLDLESS (ENDOMECHANICALS) ×1 IMPLANT
TROCAR Z-THREAD FIOS 11X100 BL (TROCAR) ×2 IMPLANT
TROCAR Z-THREAD FIOS 5X100MM (TROCAR) ×3 IMPLANT
TROCAR Z-THREAD SLEEVE 11X100 (TROCAR) IMPLANT
TUBING FILTER THERMOFLATOR (ELECTROSURGICAL) IMPLANT
TUBING INSUFFLATION 10FT LAP (TUBING) ×3 IMPLANT

## 2012-10-29 NOTE — Interval H&P Note (Signed)
History and Physical Interval Note:  10/29/2012 8:41 AM  Gloria Fields  has presented today for surgery, with the diagnosis of recurrent ventral hernia  The various methods of treatment have been discussed with the patient and family. After consideration of risks, benefits and other options for treatment, the patient has consented to  Procedure(s) (LRB) with comments: LAPAROSCOPIC VENTRAL HERNIA (N/A) - Laparoscopic versus Open Ventral hernia repair as a surgical intervention .  The patient's history has been reviewed, patient examined, no change in status, stable for surgery.  I have reviewed the patient's chart and labs.  Questions were answered to the patient's satisfaction.     Jailine Lieder B

## 2012-10-29 NOTE — Anesthesia Postprocedure Evaluation (Signed)
  Anesthesia Post-op Note  Patient: Gloria Fields  Procedure(s) Performed: Procedure(s) (LRB): LAPAROSCOPIC VENTRAL HERNIA (N/A) LAPAROSCOPIC LYSIS OF ADHESIONS ()  Patient Location: PACU  Anesthesia Type: General  Level of Consciousness: awake and alert   Airway and Oxygen Therapy: Patient Spontanous Breathing  Post-op Pain: mild  Post-op Assessment: Post-op Vital signs reviewed, Patient's Cardiovascular Status Stable, Respiratory Function Stable, Patent Airway and No signs of Nausea or vomiting  Last Vitals:  Filed Vitals:   10/29/12 1315  BP: 136/64  Pulse: 89  Temp:   Resp: 17    Post-op Vital Signs: stable   Complications: No apparent anesthesia complications

## 2012-10-29 NOTE — Anesthesia Preprocedure Evaluation (Addendum)
Anesthesia Evaluation  Patient identified by MRN, date of birth, ID band Patient awake    Reviewed: Allergy & Precautions, H&P , NPO status , Patient's Chart, lab work & pertinent test results  History of Anesthesia Complications (+) PONV  Airway Mallampati: II TM Distance: <3 FB Neck ROM: Full    Dental  (+) Caps and Dental Advisory Given   Pulmonary neg pulmonary ROS,  breath sounds clear to auscultation  Pulmonary exam normal       Cardiovascular hypertension, Pt. on medications Rhythm:Regular Rate:Normal     Neuro/Psych Bipolar Disorder negative neurological ROS     GI/Hepatic Neg liver ROS, hiatal hernia, GERD-  Medicated,  Endo/Other  negative endocrine ROS  Renal/GU negative Renal ROS  negative genitourinary   Musculoskeletal negative musculoskeletal ROS (+)   Abdominal   Peds negative pediatric ROS (+)  Hematology negative hematology ROS (+)   Anesthesia Other Findings   Reproductive/Obstetrics negative OB ROS                          Anesthesia Physical Anesthesia Plan  ASA: II  Anesthesia Plan: General   Post-op Pain Management:    Induction: Intravenous  Airway Management Planned: Oral ETT  Additional Equipment:   Intra-op Plan:   Post-operative Plan: Extubation in OR  Informed Consent: I have reviewed the patients History and Physical, chart, labs and discussed the procedure including the risks, benefits and alternatives for the proposed anesthesia with the patient or authorized representative who has indicated his/her understanding and acceptance.   Dental advisory given  Plan Discussed with: CRNA and Surgeon  Anesthesia Plan Comments:         Anesthesia Quick Evaluation

## 2012-10-29 NOTE — Preoperative (Signed)
Beta Blockers   Reason not to administer Beta Blockers:Not Applicable 

## 2012-10-29 NOTE — Transfer of Care (Signed)
Immediate Anesthesia Transfer of Care Note  Patient: Gloria Fields  Procedure(s) Performed: Procedure(s) (LRB) with comments: LAPAROSCOPIC VENTRAL HERNIA (N/A) - LAPRASCOPIC  ASSISTED OPEN VENTRAL HERNIA REPAIR, LAPRASCOPIC LYSIS OF ADHESIONS  LAPAROSCOPIC LYSIS OF ADHESIONS ()  Patient Location: PACU  Anesthesia Type:General  Level of Consciousness: awake, patient cooperative, lethargic and responds to stimulation  Airway & Oxygen Therapy: Patient Spontanous Breathing and Patient connected to face mask oxygen  Post-op Assessment: Report given to PACU RN, Post -op Vital signs reviewed and stable and Patient moving all extremities  Post vital signs: Reviewed and stable  Complications: No apparent anesthesia complications

## 2012-10-29 NOTE — H&P (View-Only) (Signed)
Chief Complaint:  Recurrent upper midline ventral hernia  History of Present Illness:  Gloria Fields is an 66 y.o. female who underwent an upper midline ventral hernia repair by me in September of 2008. This was done using proceed mesh placed anteriorly. I had operated on her in March 2007 for a radical peritoneal debridement for a ruptured appendicitis and her hernia resulted from that intervention.  She reports some discomfort in her upper abdomen. The hernia is not as big as the one before but I can feel a couple of areas that we and may represent proceed mesh failure. I will plan to approach this laparoscopically tried again in and take down the hernia and then probably combine this with an open approach. I explained this to her. She wants to proceed with surgery.  She also asked me to examine her foot she has a bunion. I referred her to Dr. Victorino Dike or Dr. Lestine Box because she has an element of plantar fasciitis I think along with that.  Past Medical History  Diagnosis Date  . Hiatal hernia   . Esophageal reflux   . Diverticulosis of colon (without mention of hemorrhage)   . Hypertension   . Osteopenia   . ADD (attention deficit disorder)   . Anxiety   . Depression   . Back pain   . Breast cancer 2007  . Hypercholesterolemia   . Family history of malignant neoplasm of gastrointestinal tract   . Irritable bowel syndrome     Past Surgical History  Procedure Date  . Appendectomy   . Hernia repair   . Uterine fibroid surgery   . Bowel perforation 2007  . Breast lumpectomy 2007    Current Outpatient Prescriptions  Medication Sig Dispense Refill  . amphetamine-dextroamphetamine (ADDERALL) 10 MG tablet Take 20 mg by mouth 2 (two) times daily.       Marland Kitchen anastrozole (ARIMIDEX) 1 MG tablet TAKE 1 TABLET BY MOUTH EVERY DAY  30 tablet  4  . ARIPiprazole (ABILIFY) 2 MG tablet Take 1 mg by mouth daily.        Marland Kitchen b complex vitamins tablet Take 1 tablet by mouth daily.        .  bifidobacterium infantis (ALIGN) capsule Take one a tablet by mouth once a day while on Xifaxan.  14 capsule  0  . Bioflavonoid Products (PERIDRIN-C) 200-50-150 MG TABS Take 1 capsule by mouth 2 (two) times daily.        . Calcium Carbonate-Vitamin D (CALTRATE 600+D) 600-400 MG-UNIT per tablet Take 1 tablet by mouth daily.        . clonazePAM (KLONOPIN) 1 MG tablet Take 1 mg by mouth 4 (four) times daily as needed.        Marland Kitchen dexlansoprazole (DEXILANT) 60 MG capsule Take 1 capsule (60 mg total) by mouth daily.  30 capsule  6  . Eszopiclone (ESZOPICLONE) 3 MG TABS Take 3 mg by mouth at bedtime. Take immediately before bedtime       . megestrol (MEGACE) 20 MG tablet Take 10 mg by mouth 2 (two) times daily.       . Multiple Vitamins-Minerals (CENTRUM SILVER PO) Take 1 capsule by mouth daily.        Marland Kitchen omega-3 fish oil (MAXEPA) 1000 MG CAPS capsule Take 1 capsule by mouth 2 (two) times daily.        Marland Kitchen omeprazole (PRILOSEC) 20 MG capsule       . ondansetron (ZOFRAN) 4 MG tablet Take 4  mg by mouth every 8 (eight) hours as needed.      . rifaximin (XIFAXAN) 550 MG TABS Take 1 tablet (550 mg total) by mouth 2 (two) times daily.  20 tablet  0  . sertraline (ZOLOFT) 100 MG tablet Take 100 mg by mouth daily.       . valsartan-hydrochlorothiazide (DIOVAN HCT) 160-25 MG per tablet Take 1 tablet by mouth daily.        . vitamin E 100 UNIT capsule Take 100 Units by mouth daily.       Morphine Family History  Problem Relation Age of Onset  . Colon cancer Father     ?  Marland Kitchen Pancreatic cancer Cousin   . Diabetes Maternal Aunt   . Prostate cancer Maternal Grandfather   . Breast cancer Maternal Aunt     X 3   Social History:   reports that she has never smoked. She has never used smokeless tobacco. She reports that she drinks alcohol. She reports that she does not use illicit drugs.   REVIEW OF SYSTEMS - PERTINENT POSITIVES ONLY: No history of DVT  Physical Exam:   Blood pressure 128/78, pulse 72,  temperature 97 F (36.1 C), temperature source Oral, resp. rate 18, height 5' 0.05" (1.525 m), weight 162 lb (73.483 kg). Body mass index is 31.59 kg/(m^2).  Gen:  WDWN white female NAD  Neurological: Alert and oriented to person, place, and time. Motor and sensory function is grossly intact  Head: Normocephalic and atraumatic.  Eyes: Conjunctivae are normal. Pupils are equal, round, and reactive to light. No scleral icterus.  Neck: Normal range of motion. Neck supple. No tracheal deviation or thyromegaly present.  Cardiovascular:  SR without murmurs or gallops.  No carotid bruits Respiratory: Effort normal.  No respiratory distress. No chest wall tenderness. Breath sounds normal.  No wheezes, rales or rhonchi.  Abdomen:  Multiple soft weaknesses in the upper area of her midline incision. Reducible in the supine position. GU: Musculoskeletal: Normal range of motion. Extremities are nontender. No cyanosis, edema or clubbing noted Lymphadenopathy: No cervical, preauricular, postauricular or axillary adenopathy is present Skin: Skin is warm and dry. No rash noted. No diaphoresis. No erythema. No pallor. Pscyh: Normal mood and affect. Behavior is normal. Judgment and thought content normal.   LABORATORY RESULTS: No results found for this or any previous visit (from the past 48 hour(s)).  RADIOLOGY RESULTS: No results found.  Problem List: Patient Active Problem List  Diagnosis  . Nausea & vomiting  . GERD (gastroesophageal reflux disease)  . Abdominal adhesions  . Depression, major, recurrent  . Anxiety disorder  . Diverticulosis of colon (without mention of hemorrhage)  . Nausea with vomiting  . Esophageal reflux  . Pyloric channel ulcer  . Recurrent ventral hernia    Assessment & Plan: Although I anticipate some degree of adhesions from her prior peritonitis. I will attempt a laparoscopic approach but may fall back on a open approach. We'll plan to do a bowel prep  preoperatively.    Matt B. Daphine Deutscher, MD, [facility] [phone] beeper [phone]  10/15/2012 10:09 AM

## 2012-10-29 NOTE — Transfer of Care (Signed)
Immediate Anesthesia Transfer of Care Note  Patient: Hedda K Hettich  Procedure(s) Performed: Procedure(s) (LRB) with comments: LAPAROSCOPIC VENTRAL HERNIA (N/A) - LAPRASCOPIC  ASSISTED OPEN VENTRAL HERNIA REPAIR, LAPRASCOPIC LYSIS OF ADHESIONS  LAPAROSCOPIC LYSIS OF ADHESIONS ()  Patient Location: PACU  Anesthesia Type:General  Level of Consciousness: awake, patient cooperative, lethargic and responds to stimulation  Airway & Oxygen Therapy: Patient Spontanous Breathing and Patient connected to face mask oxygen  Post-op Assessment: Report given to PACU RN, Post -op Vital signs reviewed and stable and Patient moving all extremities  Post vital signs: Reviewed and stable  Complications: No apparent anesthesia complications 

## 2012-10-29 NOTE — Progress Notes (Signed)
Pt unable to void after 7 hours.  Pt bladder scanned.  237 mL was noted on bladder scanner.  MD on call was paged.  Instructed to put a foley back in at 2200.  Foley will be put in.  Gloria Fields

## 2012-10-29 NOTE — Brief Op Note (Signed)
10/29/2012  1:09 PM  PATIENT:  Gloria Fields  66 y.o. female  PRE-OPERATIVE DIAGNOSIS:  recurrent ventral hernia  POST-OPERATIVE DIAGNOSIS:  RECURRENT VENTRAL HERNIA   PROCEDURE:  Procedure(s) (LRB) with comments: LAPAROSCOPIC VENTRAL HERNIA (N/A) - LAPRASCOPIC  ASSISTED OPEN VENTRAL HERNIA REPAIR, LAPRASCOPIC LYSIS OF ADHESIONS  LAPAROSCOPIC LYSIS OF ADHESIONS ()  SURGEON:  Surgeon(s) and Role:    * Valarie Merino, MD - Primary  PHYSICIAN ASSISTANT:   ASSISTANTS: none   ANESTHESIA:   general  EBL:  Total I/O In: 1700 [I.V.:1700] Out: 150 [Urine:125; Blood:25]  BLOOD ADMINISTERED:none  DRAINS: none   LOCAL MEDICATIONS USED:  MARCAINE     SPECIMEN:  No Specimen  DISPOSITION OF SPECIMEN:  N/A  COUNTS:  YES  TOURNIQUET:  * No tourniquets in log *  DICTATION: .Other Dictation: Dictation Number [id number]  PLAN OF CARE: Admit to inpatient   PATIENT DISPOSITION:  PACU - hemodynamically stable.   Delay start of Pharmacological VTE agent (>24hrs) due to surgical blood loss or risk of bleeding: no

## 2012-10-29 NOTE — Anesthesia Procedure Notes (Signed)
Procedure Name: Intubation Date/Time: 10/29/2012 9:39 AM Performed by: Edison Pace Pre-anesthesia Checklist: Patient identified, Timeout performed, Emergency Drugs available, Suction available and Patient being monitored Patient Re-evaluated:Patient Re-evaluated prior to inductionOxygen Delivery Method: Circle system utilized Preoxygenation: Pre-oxygenation with 100% oxygen Intubation Type: IV induction and Cricoid Pressure applied Ventilation: Mask ventilation without difficulty Laryngoscope Size: Mac and 3 Grade View: Grade III Tube type: Oral Tube size: 7.0 mm Number of attempts: 1 Airway Equipment and Method: Stylet Placement Confirmation: ETT inserted through vocal cords under direct vision,  positive ETCO2 and breath sounds checked- equal and bilateral Secured at: 20 cm Tube secured with: Tape Dental Injury: Teeth and Oropharynx as per pre-operative assessment  Difficulty Due To: Difficulty was anticipated Future Recommendations: Recommend- induction with short-acting agent, and alternative techniques readily available

## 2012-10-30 ENCOUNTER — Encounter (HOSPITAL_COMMUNITY): Payer: Self-pay | Admitting: Surgery

## 2012-10-30 LAB — CBC
HCT: 28.9 % — ABNORMAL LOW (ref 36.0–46.0)
Hemoglobin: 9.6 g/dL — ABNORMAL LOW (ref 12.0–15.0)
MCH: 28.7 pg (ref 26.0–34.0)
MCHC: 33.2 g/dL (ref 30.0–36.0)
MCV: 86.5 fL (ref 78.0–100.0)
Platelets: 232 10*3/uL (ref 150–400)
RBC: 3.34 MIL/uL — ABNORMAL LOW (ref 3.87–5.11)
RDW: 13.6 % (ref 11.5–15.5)
WBC: 14.2 10*3/uL — ABNORMAL HIGH (ref 4.0–10.5)

## 2012-10-30 LAB — BASIC METABOLIC PANEL
BUN: 23 mg/dL (ref 6–23)
CO2: 22 mEq/L (ref 19–32)
Calcium: 9.3 mg/dL (ref 8.4–10.5)
Chloride: 103 mEq/L (ref 96–112)
Creatinine, Ser: 1.23 mg/dL — ABNORMAL HIGH (ref 0.50–1.10)
GFR calc Af Amer: 52 mL/min — ABNORMAL LOW (ref >90)
GFR calc non Af Amer: 45 mL/min — ABNORMAL LOW (ref >90)
Glucose, Bld: 156 mg/dL — ABNORMAL HIGH (ref 70–99)
Potassium: 4.5 mEq/L (ref 3.5–5.1)
Sodium: 134 mEq/L — ABNORMAL LOW (ref 135–145)

## 2012-10-30 NOTE — Op Note (Signed)
Gloria Fields, NORTH              ACCOUNT NO.:  [id number]  MEDICAL RECORD NO.:  [id number]  LOCATION:  1527                         FACILITY:  [facility]  PHYSICIAN:  Thornton Park. Daphine Deutscher, MD  DATE OF BIRTH:  [birth year]  DATE OF PROCEDURE: DATE OF DISCHARGE:                              OPERATIVE REPORT   PREOPERATIVE DIAGNOSES:  Prior ruptured appendix and laparotomy with subsequent ventral hernia, repaired with Proceed mesh, now with recurrence in the upper abdomen.  PROCEDURE:  Laparoscopic enterolysis x2 hours taking down small intestine and omentum from mesh, open excision of old mesh and primary closure with interrupted #1 Novafil.  Re-laparoscopy to check closure.  ANESTHESIA:  General endotracheal with Exparel, injected at the end of the case.  SURGEON:  Thornton Park. Daphine Deutscher, MD  ASSISTANT:  None.  DESCRIPTION OF PROCEDURE:  Ms. Macfarlane was taken to room #11 on Thursday, October 29, 2012, and given general anesthesia.  The abdomen was prepped with PCMX and draped sterilely.  The abdomen was then entered through the right upper quadrant with a 5-mm Optiview, and the abdomen was insufflated.  Medially noticeable, were very dense adhesions, stuck to the anterior abdominal wall starting at the liver where there were a lot of little Curtis 50 bands on both lobes and what appeared to be the transverse colon stuck in the midline and then small bowel stuck to the anterior abdominal wall along with a mass of omentum.  Ultimately, I placed a total of 4-5 mm around the perimeter.  Using mainly sharp dissection and some harmonic scalpel where I had began a tedious 2-hour enterolysis taking down the omentum and small intestine.  I did not create any enterotomies that I saw.  Once the bowel which had appeared to be stuck to the mesh was taken down, I then began to convert to an open.  What I found was appeared the hernia repair, which had been done, open with an onlay of Proceed mesh, had  pulled away and allowed diastasis and pulled up, and the omentum and bowel material were stuck to the mesh.  I think this would made it so difficult.  So what I did with the abdomen still insufflated, I made a midline incision overlying this big hernia and dissected free the mesh, which was protuberating.  I excised an ellipse of this.  This enabled me to enter the abdomen.  My first charge was to examine the small bowel and around the small bowel proximally and distally.  I found the areas where I had taken it down and freed up adhesions of the bowel that seemed to be free.  The colon was inspected and again no enterotomies were seen.  I irrigated and looked that everything looked good.  I then elected to leave the mesh in place, it was already grown in the perimeter and closure primarily taking wide deep bites with #1 Novafil, and once these were placed, these were tied down completely closing the defect.  I then reinserted the angle 5 degrees scope, inspected everything and it looked to be in order.  There was one adhesion down to the pelvis, which was more of an adhesion to the bladder and  there was no bowel involved. I felt like it was a complete enterolysis of any intestinal obstructions to the anterior abdominal wall.  The closure looked good and felt good both on the inside and on the outside.  The wounds were injected with Exparel, closed with 4-0 Vicryl and with staples. The abdomen was decompressed.  The patient tolerated the procedure well, was taken to the recovery room in satisfactory condition.     Thornton Park Daphine Deutscher, MD     MBM/MEDQ  D:  10/29/2012  T:  10/30/2012  Job:  [id number]

## 2012-10-30 NOTE — Care Management Note (Addendum)
    Page 1 of 1   11/03/2012     11:45:41 AM   CARE MANAGEMENT NOTE 11/03/2012  Patient:  Gloria Fields, Gloria Fields   Account Number:  [id number]  Date Initiated:  10/30/2012  Documentation initiated by:  Lorenda Ishihara  Subjective/Objective Assessment:   66 yo female admitted s/p lysis of adhesions, hernia repair. PTA lived at home alone.     Action/Plan:   Home when stable   Anticipated DC Date:  11/03/2012   Anticipated DC Plan:  HOME/SELF CARE      DC Planning Services  CM consult      Choice offered to / List presented to:             Status of service:  Completed, signed off Medicare Important Message given?   (If response is "NO", the following Medicare IM given date fields will be blank) Date Medicare IM given:   Date Additional Medicare IM given:    Discharge Disposition:  HOME/SELF CARE  Per UR Regulation:  Reviewed for med. necessity/level of care/duration of stay  If discussed at Long Length of Stay Meetings, dates discussed:    Comments:  11/03/12 Izamar Linden RN,BSN NCM [phone] D/C HOME NO NEEDS.

## 2012-10-31 LAB — CBC
HCT: 26.8 % — ABNORMAL LOW (ref 36.0–46.0)
Hemoglobin: 8.7 g/dL — ABNORMAL LOW (ref 12.0–15.0)
MCH: 29.1 pg (ref 26.0–34.0)
MCHC: 32.5 g/dL (ref 30.0–36.0)
MCV: 89.6 fL (ref 78.0–100.0)
Platelets: 206 10*3/uL (ref 150–400)
RBC: 2.99 MIL/uL — ABNORMAL LOW (ref 3.87–5.11)
RDW: 14.3 % (ref 11.5–15.5)
WBC: 11.4 10*3/uL — ABNORMAL HIGH (ref 4.0–10.5)

## 2012-10-31 LAB — BASIC METABOLIC PANEL
BUN: 22 mg/dL (ref 6–23)
CO2: 24 mEq/L (ref 19–32)
Calcium: 9.4 mg/dL (ref 8.4–10.5)
Chloride: 104 mEq/L (ref 96–112)
Creatinine, Ser: 1.11 mg/dL — ABNORMAL HIGH (ref 0.50–1.10)
GFR calc Af Amer: 59 mL/min — ABNORMAL LOW (ref >90)
GFR calc non Af Amer: 51 mL/min — ABNORMAL LOW (ref >90)
Glucose, Bld: 104 mg/dL — ABNORMAL HIGH (ref 70–99)
Potassium: 4.1 mEq/L (ref 3.5–5.1)
Sodium: 136 mEq/L (ref 135–145)

## 2012-10-31 NOTE — Progress Notes (Signed)
Patient ID: Gloria Fields, female   DOB: Apr 08, 1946, 66 y.o.   MRN: [id number]  General Surgery - [facility] - Progress Note  POD# 2   Subjective: Patient in bed.  Family at bedside.  Mild nausea.  Ambulated.  Pain well controlled.  Objective: Vital signs in last 24 hours: Temp:  [97.7 F (36.5 C)-98.8 F (37.1 C)] 97.9 F (36.6 C) (12/07 0535) Pulse Rate:  [56-84] 62  (12/07 0535) Resp:  [16-18] 18  (12/07 0535) BP: (115-134)/(71-76) 116/72 mmHg (12/07 0535) SpO2:  [100 %] 100 % (12/07 0535) Last BM Date: 10/28/12  Intake/Output from previous day: 12/06 0701 - 12/07 0700 In: 2500 [I.V.:2400; IV Piggyback:100] Out: 1700 [Urine:1700]  Exam: HEENT - clear, not icteric Abd - soft, mild distension, binder in place; dressings dry and intact; rare BS Ext - no significant edema Neuro - grossly intact, no focal deficits  Lab Results:   [facility] 10/31/12 0454 10/30/12 0540  WBC 11.4* 14.2*  HGB 8.7* 9.6*  HCT 26.8* 28.9*  PLT 206 232     Basename 10/31/12 0454 10/30/12 0540  NA 136 134*  K 4.1 4.5  CL 104 103  CO2 24 22  GLUCOSE 104* 156*  BUN 22 23  CREATININE 1.11* 1.23*  CALCIUM 9.4 9.3    Studies/Results: No results found.  Assessment / Plan: 1.  Status post lap VH repair, lysis of adhesions  Allow sips of clear liquids, no trays  Encouraged to ambulate in halls  Rx nausea, pain as needed  Velora Heckler, MD, [facility] Office: [phone]  10/31/2012

## 2012-10-31 NOTE — Plan of Care (Signed)
Problem: Phase I Progression Outcomes Goal: Voiding-avoid urinary catheter unless indicated Outcome: Progressing Pt due to void since foley discontinued earlier this am.

## 2012-11-01 MED ORDER — ZOLPIDEM TARTRATE 5 MG PO TABS: 5.0000 mg | ORAL_TABLET | Freq: Every evening | ORAL | Status: DC | PRN

## 2012-11-01 MED ORDER — PANTOPRAZOLE SODIUM 40 MG PO TBEC
40.0000 mg | DELAYED_RELEASE_TABLET | Freq: Every day | ORAL | Status: DC
  Administered 2012-11-02 – 2012-11-03 (×2): 40 mg via ORAL
  Filled 2012-11-01 (×3): qty 1

## 2012-11-01 MED ORDER — SERTRALINE HCL 100 MG PO TABS
100.0000 mg | ORAL_TABLET | Freq: Every morning | ORAL | Status: DC
Start: 2012-11-01 — End: 2012-11-03
  Administered 2012-11-01 – 2012-11-03 (×3): 100 mg via ORAL
  Filled 2012-11-01 (×3): qty 1

## 2012-11-01 MED ORDER — DIPHENHYDRAMINE HCL 50 MG/ML IJ SOLN: 12.5000 mg | Freq: Four times a day (QID) | INTRAMUSCULAR | Status: DC | PRN

## 2012-11-01 MED ORDER — OXYCODONE HCL 5 MG PO TABS
5.0000 mg | ORAL_TABLET | ORAL | Status: DC | PRN
  Administered 2012-11-01 – 2012-11-02 (×3): 10 mg via ORAL
  Administered 2012-11-02 (×2): 5 mg via ORAL
  Administered 2012-11-03: 10 mg via ORAL
  Filled 2012-11-01 (×2): qty 2
  Filled 2012-11-01 (×2): qty 1
  Filled 2012-11-01 (×2): qty 2

## 2012-11-01 MED ORDER — IRBESARTAN 150 MG PO TABS
150.0000 mg | ORAL_TABLET | Freq: Every day | ORAL | Status: DC
  Administered 2012-11-01 – 2012-11-03 (×3): 150 mg via ORAL
  Filled 2012-11-01 (×3): qty 1

## 2012-11-01 MED ORDER — POLYETHYLENE GLYCOL 3350 17 G PO PACK
17.0000 g | PACK | Freq: Two times a day (BID) | ORAL | Status: DC | PRN
  Filled 2012-11-01: qty 1

## 2012-11-01 MED ORDER — LIP MEDEX EX OINT
1.0000 "application " | TOPICAL_OINTMENT | Freq: Two times a day (BID) | CUTANEOUS | Status: DC
  Administered 2012-11-01 – 2012-11-03 (×5): 1 via TOPICAL
  Filled 2012-11-01: qty 7

## 2012-11-01 MED ORDER — ALUM & MAG HYDROXIDE-SIMETH 200-200-20 MG/5ML PO SUSP: 30.0000 mL | Freq: Four times a day (QID) | ORAL | Status: DC | PRN

## 2012-11-01 MED ORDER — VITAMIN D3 25 MCG (1000 UNIT) PO TABS
1000.0000 [IU] | ORAL_TABLET | Freq: Every day | ORAL | Status: DC
  Administered 2012-11-01: 1000 [IU] via ORAL
  Filled 2012-11-01 (×3): qty 1

## 2012-11-01 MED ORDER — CALCIUM CARBONATE-VITAMIN D 600-400 MG-UNIT PO TABS: 1.0000 | ORAL_TABLET | Freq: Every day | ORAL | Status: DC

## 2012-11-01 MED ORDER — CALCIUM CARBONATE-VITAMIN D 500-200 MG-UNIT PO TABS
1.0000 | ORAL_TABLET | Freq: Every day | ORAL | Status: DC
  Administered 2012-11-01: 1 via ORAL
  Filled 2012-11-01 (×3): qty 1

## 2012-11-01 MED ORDER — VALSARTAN-HYDROCHLOROTHIAZIDE 160-25 MG PO TABS: 1.0000 | ORAL_TABLET | Freq: Every morning | ORAL | Status: DC

## 2012-11-01 MED ORDER — LACTATED RINGERS IV BOLUS (SEPSIS): 1000.0000 mL | Freq: Three times a day (TID) | INTRAVENOUS | Status: AC | PRN

## 2012-11-01 MED ORDER — ACETAMINOPHEN 500 MG PO TABS
1000.0000 mg | ORAL_TABLET | Freq: Three times a day (TID) | ORAL | Status: DC
  Administered 2012-11-01 – 2012-11-03 (×6): 1000 mg via ORAL
  Filled 2012-11-01 (×8): qty 2

## 2012-11-01 MED ORDER — AMPHETAMINE-DEXTROAMPHETAMINE 10 MG PO TABS
10.0000 mg | ORAL_TABLET | Freq: Two times a day (BID) | ORAL | Status: DC
  Administered 2012-11-03: 10 mg via ORAL
  Filled 2012-11-01 (×3): qty 1

## 2012-11-01 MED ORDER — VITAMIN E 45 MG (100 UNIT) PO CAPS
100.0000 [IU] | ORAL_CAPSULE | Freq: Every day | ORAL | Status: DC
  Administered 2012-11-01: 100 [IU] via ORAL
  Filled 2012-11-01 (×3): qty 1

## 2012-11-01 MED ORDER — MAGIC MOUTHWASH
15.0000 mL | Freq: Four times a day (QID) | ORAL | Status: DC | PRN
  Filled 2012-11-01: qty 15

## 2012-11-01 MED ORDER — HYDROCHLOROTHIAZIDE 25 MG PO TABS
25.0000 mg | ORAL_TABLET | Freq: Every day | ORAL | Status: DC
  Administered 2012-11-01 – 2012-11-03 (×3): 25 mg via ORAL
  Filled 2012-11-01 (×3): qty 1

## 2012-11-01 NOTE — Progress Notes (Signed)
Gloria Fields [id number] Oct 05, 1946   Subjective:  Feeling better Husband in room Tired of clears +flatus/BM Walking in hallways  Objective:  Vital signs:  Filed Vitals:   10/31/12 0535 10/31/12 1400 10/31/12 2120 11/01/12 0554  BP: 116/72 133/70 151/79 128/61  Pulse: 62 71 85 73  Temp: 97.9 F (36.6 C) 98 F (36.7 C) 98.3 F (36.8 C) 98.7 F (37.1 C)  TempSrc: Oral Oral Oral Oral  Resp: 18 18 18 16   Height:      Weight:      SpO2: 100% 97% 99% 96%    Last BM Date: 10/31/12  Intake/Output   Yesterday:  12/07 0701 - 12/08 0700 In: 2400 [I.V.:2400] Out: 1200 [Urine:1200] This shift:  Total I/O In: -  Out: 200 [Urine:200]  Bowel function:  Flatus: y  BM: y  Physical Exam:  General: Pt awake/alert/oriented x4 in no acute distress Eyes: PERRL, normal EOM.  Sclera clear.  No icterus Neuro: CN II-XII intact w/o focal sensory/motor deficits. Lymph: No head/neck/groin lymphadenopathy Psych:  No delerium/psychosis/paranoia HENT: Normocephalic, Mucus membranes moist.  No thrush Neck: Supple, No tracheal deviation Chest: No chest wall pain w good excursion CV:  Pulses intact.  Regular rhythm MS: Normal AROM mjr joints.  No obvious deformity Abdomen: Soft.  Nondistended.  Mildly tender at incisions only.  No incarcerated hernias. Ext:  SCDs BLE.  No mjr edema.  No cyanosis Skin: No petechiae / purpurae  Problem List:  Principal Problem:  *Recurrent ventral hernia Active Problems:  Abdominal adhesions   Assessment  Gloria Fields  66 y.o. female  3 Days Post-Op  Procedure(s): LAPAROSCOPIC VENTRAL HERNIA LAPAROSCOPIC LYSIS OF ADHESIONS  Ileus resolving  Plan:  -adv diet -stop IVF -try PO pain control -VTE prophylaxis- SCDs, etc -mobilize as tolerated to help recovery -prob d/c tomorrow if continues to improve  Ardeth Sportsman, M.D., F.A.C.S. Gastrointestinal and Minimally Invasive Surgery [facility] [address], [address] [place], [place] [address] [phone] Main / Paging [phone] Voice Mail   11/01/2012  CARE TEAM:  PCP: REDMON,NOELLE, PA  Outpatient Care Team: Patient Care Team: Milus Height, [place] as PCP - General (Nurse Practitioner)  Inpatient Treatment Team: Treatment Team: Attending Provider: Valarie Merino, MD; Technician: Gigi Gin, NT; Technician: Lynden Ang, NT; Registered Nurse: Sydell Axon, RN; Technician: Vella Raring, NT; Respiratory Therapist: Renold Genta, RRT   Results:   Labs: Results for orders placed during the hospital encounter of 10/29/12 (from the past 48 hour(s))  CBC     Status: Abnormal   Collection Time   10/31/12  4:54 AM      Component Value Range Comment   WBC 11.4 (*) 4.0 - 10.5 K/uL    RBC 2.99 (*) 3.87 - 5.11 MIL/uL    Hemoglobin 8.7 (*) 12.0 - 15.0 g/dL    HCT 53.6 (*) 64.4 - 46.0 %    MCV 89.6  78.0 - 100.0 fL    MCH 29.1  26.0 - 34.0 pg    MCHC 32.5  30.0 - 36.0 g/dL    RDW 03.4  74.2 - 59.5 %    Platelets 206  150 - 400 K/uL   BASIC METABOLIC PANEL     Status: Abnormal   Collection Time   10/31/12  4:54 AM      Component Value Range Comment   Sodium 136  135 - 145 mEq/L    Potassium 4.1  3.5 -  5.1 mEq/L    Chloride 104  96 - 112 mEq/L    CO2 24  19 - 32 mEq/L    Glucose, Bld 104 (*) 70 - 99 mg/dL    BUN 22  6 - 23 mg/dL    Creatinine, Ser 1.61 (*) 0.50 - 1.10 mg/dL    Calcium 9.4  8.4 - 09.6 mg/dL    GFR calc non Af Amer 51 (*) >90 mL/min    GFR calc Af Amer 59 (*) >90 mL/min     Imaging / Studies: No results found.  Medications / Allergies: per chart  Antibiotics: Anti-infectives     Start     Dose/Rate Route Frequency Ordered Stop   10/29/12 0657   cefOXitin (MEFOXIN) 2 g in dextrose 5 % 50 mL IVPB        2 g 100 mL/hr over 30 Minutes Intravenous On call to O.R. 10/29/12 0454 10/29/12 0930

## 2012-11-02 NOTE — Progress Notes (Signed)
Patient ID: Gloria Fields, female   DOB: 1946-08-14, 66 y.o.   MRN: [id number] [facility] Progress Note:   4 Days Post-Op  Subjective: Mental status is clear.  Less pain Objective: Vital signs in last 24 hours: Temp:  [97.7 F (36.5 C)-98.6 F (37 C)] 97.7 F (36.5 C) (12/09 1025) Pulse Rate:  [70-86] 85  (12/09 1025) Resp:  [18] 18  (12/09 1025) BP: (127-147)/(56-80) 145/80 mmHg (12/09 1025) SpO2:  [96 %-100 %] 99 % (12/09 1025)  Intake/Output from previous day: 12/08 0701 - 12/09 0700 In: 720 [P.O.:720] Out: 1550 [Urine:1550] Intake/Output this shift: Total I/O In: 360 [P.O.:360] Out: 100 [Urine:100]  Physical Exam: Work of breathing is normal.  Has been up walking.    Lab Results:  No results found for this or any previous visit (from the past 48 hour(s)).  Radiology/Results: No results found.  Anti-infectives: Anti-infectives     Start     Dose/Rate Route Frequency Ordered Stop   10/29/12 0657   cefOXitin (MEFOXIN) 2 g in dextrose 5 % 50 mL IVPB        2 g 100 mL/hr over 30 Minutes Intravenous On call to O.R. 10/29/12 0657 10/29/12 0930          Assessment/Plan: Problem List: Patient Active Problem List  Diagnosis  . GERD (gastroesophageal reflux disease)  . Abdominal adhesions  . Depression, major, recurrent  . Anxiety disorder  . Diverticulosis of colon (without mention of hemorrhage)  . Nausea with vomiting  . Esophageal reflux  . Pyloric channel ulcer  . Recurrent ventral hernia    Doing well.  Hopeful discharge tomorrow  4 Days Post-Op    LOS: 4 days   Matt B. Daphine Deutscher, MD, [facility] [place] [facility] [phone] beeper [phone]  11/02/2012 12:19 PM

## 2012-11-03 LAB — BASIC METABOLIC PANEL
BUN: 17 mg/dL (ref 6–23)
CO2: 25 mEq/L (ref 19–32)
Calcium: 8.9 mg/dL (ref 8.4–10.5)
Chloride: 103 mEq/L (ref 96–112)
Creatinine, Ser: 1.1 mg/dL (ref 0.50–1.10)
GFR calc Af Amer: 60 mL/min — ABNORMAL LOW (ref >90)
GFR calc non Af Amer: 52 mL/min — ABNORMAL LOW (ref >90)
Glucose, Bld: 99 mg/dL (ref 70–99)
Potassium: 3.6 mEq/L (ref 3.5–5.1)
Sodium: 138 mEq/L (ref 135–145)

## 2012-11-03 LAB — CBC WITH DIFFERENTIAL/PLATELET
Basophils Absolute: 0 10*3/uL (ref 0.0–0.1)
Basophils Relative: 0 % (ref 0–1)
Eosinophils Absolute: 0.7 10*3/uL (ref 0.0–0.7)
Eosinophils Relative: 7 % — ABNORMAL HIGH (ref 0–5)
HCT: 26.5 % — ABNORMAL LOW (ref 36.0–46.0)
Hemoglobin: 8.7 g/dL — ABNORMAL LOW (ref 12.0–15.0)
Lymphocytes Relative: 29 % (ref 12–46)
Lymphs Abs: 3 10*3/uL (ref 0.7–4.0)
MCH: 29.2 pg (ref 26.0–34.0)
MCHC: 32.8 g/dL (ref 30.0–36.0)
MCV: 88.9 fL (ref 78.0–100.0)
Monocytes Absolute: 0.5 10*3/uL (ref 0.1–1.0)
Monocytes Relative: 5 % (ref 3–12)
Neutro Abs: 6.3 10*3/uL (ref 1.7–7.7)
Neutrophils Relative %: 59 % (ref 43–77)
Platelets: 239 10*3/uL (ref 150–400)
RBC: 2.98 MIL/uL — ABNORMAL LOW (ref 3.87–5.11)
RDW: 13.7 % (ref 11.5–15.5)
WBC: 10.5 10*3/uL (ref 4.0–10.5)

## 2012-11-03 MED ORDER — OXYCODONE HCL 5 MG PO TABS: 5.0000 mg | ORAL_TABLET | ORAL | Status: DC | PRN

## 2012-11-03 NOTE — Progress Notes (Signed)
Patient dishcarged via wheelchair. Staples removed and steri strips applied. rx for oxycodone given, IV removed by Selena Batten NT+3. Patient states understanding of discharge instructions.

## 2012-11-03 NOTE — Discharge Summary (Signed)
Physician Discharge Summary  Patient ID: Gloria Fields MRN: [id number] DOB/AGE: 1946/03/05 66 y.o.  Admit date: 10/29/2012 Discharge date: 11/03/2012  Admission Diagnoses:  Ventral hernia  Discharge Diagnoses:  Ventral hernia complicated by adhesions and partial chronic SBO  Principal Problem:  *Recurrent ventral hernia Active Problems:  Abdominal adhesions   Surgery:  Lap assisted ventral hernia repair and adhesiolysis  Discharged Condition: improved  Hospital Course:   Had surgery.  Very slow to mobilize.  Ileus from bowl manipulation.  Ready for discharge  Consults: none  Significant Diagnostic Studies: none    Discharge Exam: Blood pressure 124/51, pulse 71, temperature 98.4 F (36.9 C), temperature source Oral, resp. rate 18, height 5' (1.524 m), weight 158 lb (71.668 kg), SpO2 100.00%. Incision bland  For staple removal.  Disposition:   Discharge Orders    Future Appointments: Provider: Department: Dept Phone: Center:   11/12/2012 11:50 AM Valarie Merino, MD [facility], [place] [phone] None   11/19/2012 8:30 AM Radene Gunning [facility] MEDICAL ONCOLOGY [phone] None   11/19/2012 9:00 AM Pierce Crane, MD [facility] MEDICAL ONCOLOGY [phone] None   11/27/2012 9:00 AM [facility] Dx Dexa 1 [facility] MAMMOGRAPHY - [id number] [facility] [phone] [facility]   11/27/2012 9:30 AM [facility] Mm General Dg Mammo Room [facility] [phone] [facility]     Future Orders Please Complete By Expires   Diet - low sodium heart healthy      Increase activity slowly      Other Restrictions      Comments:   Use abdominal binder   Remove staples      No wound care          Medication List     As of 11/03/2012 11:39 AM    TAKE these medications         amphetamine-dextroamphetamine 10 MG tablet   Commonly known as: ADDERALL   Take 10 mg by mouth 2 (two) times daily.      b complex vitamins tablet   Take 1 tablet by mouth daily.      CALTRATE 600+D 600-400 MG-UNIT per tablet   Generic drug: Calcium Carbonate-Vitamin D   Take 1 tablet by mouth daily.      CENTRUM SILVER PO   Take 1 capsule by mouth daily.      cholecalciferol 1000 UNITS tablet   Commonly known as: VITAMIN D   Take 1,000 Units by mouth daily.      clonazePAM 1 MG tablet   Commonly known as: KLONOPIN   Take 1 mg by mouth 4 (four) times daily as needed. For anxiety      dexlansoprazole 60 MG capsule   Commonly known as: DEXILANT   Take 1 capsule (60 mg total) by mouth daily.      DIOVAN HCT 160-25 MG per tablet   Generic drug: valsartan-hydrochlorothiazide   Take 1 tablet by mouth every morning.      eszopiclone 3 MG Tabs   Generic drug: Eszopiclone   Take 3 mg by mouth at bedtime. Take immediately before bedtime      ondansetron 4 MG tablet   Commonly known as: ZOFRAN   Take 4 mg by mouth every 8 (eight) hours as needed. For nausea      oxyCODONE 5 MG immediate release tablet   Commonly known as: Oxy IR/ROXICODONE   Take 1 tablet (5 mg total) by mouth every 4 (four) hours as needed.  PERIDRIN-C 200-50-150 MG Tabs   Take 1 capsule by mouth 2 (two) times daily.      sertraline 100 MG tablet   Commonly known as: ZOLOFT   Take 100 mg by mouth every morning.      vitamin E 100 UNIT capsule   Take 100 Units by mouth daily.           Follow-up Information    Follow up with Valarie Merino, MD.   Contact information:   [address] [place] [address] [phone]          Signed: Valarie Merino 11/03/2012, 11:39 AM

## 2012-11-09 ENCOUNTER — Telehealth: Payer: Self-pay | Admitting: *Deleted

## 2012-11-09 NOTE — Telephone Encounter (Signed)
11/09/12 at 2:13pm - The research nurse called the pt to see if she was interested in an optional research study since she participated in the [employer] study 5 years ago.  The pt said that she would not mind giving a sample of her blood for research.  The research nurse agreed to mail the pt a copy of the 6 page informed consent form to read prior to her next office visit on 11/19/12.  The pt also agreed to come in at 8am on 11/19/12 to discuss the consent form with Narda Bonds, research nurse.

## 2012-11-12 ENCOUNTER — Encounter (INDEPENDENT_AMBULATORY_CARE_PROVIDER_SITE_OTHER): Payer: Self-pay | Admitting: Surgery

## 2012-11-12 ENCOUNTER — Ambulatory Visit (INDEPENDENT_AMBULATORY_CARE_PROVIDER_SITE_OTHER): Payer: Medicare Other | Admitting: Surgery

## 2012-11-12 VITALS — BP 134/88 | HR 88 | Temp 98.6°F | Resp 18 | Ht 60.5 in | Wt 162.6 lb

## 2012-11-12 DIAGNOSIS — K660 Peritoneal adhesions (postprocedural) (postinfection): Secondary | ICD-10-CM

## 2012-11-12 NOTE — Patient Instructions (Signed)
Hernia Repair Care After These instructions give you information on caring for yourself after your procedure. Your doctor may also give you more specific instructions. Call your doctor if you have any problems or questions after your procedure. HOME CARE   You may have changes in your poops (bowel movements).  You may have loose or watery poop (diarrhea).  You may be not able to poop.  Your bowels will slowly get back to normal.  Do not eat any food that makes you sick to your stomach (nauseous). Eat small meals 4 to 6 times a day instead of 3 large ones.  Do not drink pop. It will give you gas.  Do not drink alcohol.  Do not lift anything heavier than 10 pounds. This is about the weight of a gallon of milk.  Do not do anything that makes you very tired for at least 6 weeks.  Do not get your wound wet for 2 days.  You may take a sponge bath during this time.  After 2 days you may take a shower. Gently pat your surgical cut (incision) dry with a towel. Do not rub it.  For men: You may have been given an athletic supporter (scrotal support) before you left the hospital. It holds your scrotum and testicles closer to your body so there is no strain on your wound. Wear the supporter until your doctor tells you that you do not need it anymore. GET HELP RIGHT AWAY IF:  You have watery poop, or cannot poop for more than 3 days.  You feel sick to your stomach or throw up (vomit) more than 2 or 3 times.  You have temperature by mouth above 102 F (38.9 C).  You see redness or puffiness (swelling) around your wound.  You see yellowish white fluid (pus) coming from your wound.  You see a bulge or bump in your lower belly (abdomen) or near your groin.  You develop a rash, trouble breathing, or any other symptoms from medicines taken. MAKE SURE YOU:  Understand these instructions.  Will watch your condition.  Will get help right away if your are not doing well or get  worse. Document Released: 10/24/2008 Document Revised: 02/03/2012 Document Reviewed: 10/24/2008 ExitCare Patient Information 2013 [employer].  

## 2012-11-12 NOTE — Progress Notes (Signed)
Gloria Fields 66 y.o.  Body mass index is 31.23 kg/(m^2).  Patient Active Problem List  Diagnosis  . GERD (gastroesophageal reflux disease)  . Abdominal adhesions  . Depression, major, recurrent  . Anxiety disorder  . Diverticulosis of colon (without mention of hemorrhage)  . Nausea with vomiting  . Esophageal reflux  . Pyloric channel ulcer  . Recurrent ventral hernia    Allergies  Allergen Reactions  . Morphine     REACTION: paranoid    Past Surgical History  Procedure Date  . Appendectomy   . Hernia repair   . Uterine fibroid surgery   . Bowel perforation 2007  . Breast lumpectomy 2007    right  . Ventral hernia repair 10/29/2012    Procedure: LAPAROSCOPIC VENTRAL HERNIA;  Surgeon: Valarie Merino, MD;  Location: [facility];  Service: General;  Laterality: N/A;  LAPRASCOPIC  ASSISTED OPEN VENTRAL HERNIA REPAIR, LAPRASCOPIC LYSIS OF ADHESIONS   . Laparoscopic lysis of adhesions 10/29/2012    Procedure: LAPAROSCOPIC LYSIS OF ADHESIONS;  Surgeon: Valarie Merino, MD;  Location: [facility]S;  Service: General;;   REDMON,NOELLE, PA No diagnosis found.  First postop [name] is doing well after her ventral hernia repair.  Her incision is healed fine. There was a little spot of the top clots and silver nitrate.Marland Kitchen Her back is feeling better. She's continue to wear elastic waistband. I'll see her again in 2 months in routine follow up. So for him please her postoperative progress and she seems to be eating better. Matt B. Daphine Deutscher, MD, [facility] [phone] beeper [phone]  11/12/2012 12:42 PM

## 2012-11-19 ENCOUNTER — Other Ambulatory Visit: Payer: Self-pay | Admitting: *Deleted

## 2012-11-19 ENCOUNTER — Other Ambulatory Visit (HOSPITAL_BASED_OUTPATIENT_CLINIC_OR_DEPARTMENT_OTHER): Payer: Medicare Other | Admitting: Lab

## 2012-11-19 ENCOUNTER — Ambulatory Visit (HOSPITAL_BASED_OUTPATIENT_CLINIC_OR_DEPARTMENT_OTHER): Payer: Medicare Other | Admitting: Oncology

## 2012-11-19 ENCOUNTER — Telehealth: Payer: Self-pay | Admitting: *Deleted

## 2012-11-19 ENCOUNTER — Encounter: Payer: Self-pay | Admitting: *Deleted

## 2012-11-19 VITALS — BP 155/84 | HR 111 | Temp 98.2°F | Resp 20 | Ht 60.5 in | Wt 161.2 lb

## 2012-11-19 DIAGNOSIS — Z170 Estrogen receptor positive status [ER+]: Secondary | ICD-10-CM

## 2012-11-19 DIAGNOSIS — Z853 Personal history of malignant neoplasm of breast: Secondary | ICD-10-CM

## 2012-11-19 DIAGNOSIS — D649 Anemia, unspecified: Secondary | ICD-10-CM

## 2012-11-19 DIAGNOSIS — D510 Vitamin B12 deficiency anemia due to intrinsic factor deficiency: Secondary | ICD-10-CM

## 2012-11-19 DIAGNOSIS — C50919 Malignant neoplasm of unspecified site of unspecified female breast: Secondary | ICD-10-CM

## 2012-11-19 LAB — CBC & DIFF AND RETIC
BASO%: 0.5 % (ref 0.0–2.0)
Basophils Absolute: 0 10*3/uL (ref 0.0–0.1)
EOS%: 5.1 % (ref 0.0–7.0)
Eosinophils Absolute: 0.4 10*3/uL (ref 0.0–0.5)
HCT: 35.7 % (ref 34.8–46.6)
HGB: 11.8 g/dL (ref 11.6–15.9)
Immature Retic Fract: 14 % — ABNORMAL HIGH (ref 1.60–10.00)
LYMPH%: 20.5 % (ref 14.0–49.7)
MCH: 29.2 pg (ref 25.1–34.0)
MCHC: 33.1 g/dL (ref 31.5–36.0)
MCV: 88.4 fL (ref 79.5–101.0)
MONO#: 0.6 10*3/uL (ref 0.1–0.9)
MONO%: 6.7 % (ref 0.0–14.0)
NEUT#: 5.7 10*3/uL (ref 1.5–6.5)
NEUT%: 67.2 % (ref 38.4–76.8)
Platelets: 308 10*3/uL (ref 145–400)
RBC: 4.04 10*6/uL (ref 3.70–5.45)
RDW: 13.7 % (ref 11.2–14.5)
Retic %: 1.79 % (ref 0.70–2.10)
Retic Ct Abs: 72.32 10*3/uL (ref 33.70–90.70)
WBC: 8.5 10*3/uL (ref 3.9–10.3)
lymph#: 1.7 10*3/uL (ref 0.9–3.3)

## 2012-11-19 LAB — CANCER ANTIGEN 27.29: CA 27.29: 38 U/mL (ref 0–39)

## 2012-11-19 LAB — RESEARCH LABS

## 2012-11-19 LAB — CHCC SMEAR

## 2012-11-19 LAB — MORPHOLOGY: PLT EST: ADEQUATE

## 2012-11-19 NOTE — Telephone Encounter (Signed)
Gave patient appointment for granfortuna and magrinat sent patient back to the lab on 11-19-2012

## 2012-11-19 NOTE — Progress Notes (Signed)
Hematology and Oncology Follow Up Visit  Gloria Fields [id number] August 03, 1946 66 y.o. 11/19/2012 9:39 AM PCP dr Brunilda Payor  Principle Diagnosis:  PROBLEM:  T1c N0 breast cancer status post lumpectomy 11/04/2006, status post radiation therapy completed 02/16/06, ER/PR positive, on Arimidex x 5 years. Hx of anemia.  Interim History:  There have been no intercurrent illness, hospitalizations or medication changes. She had  Hernia surgery 12/13., which demonstrated a small SBO. . Mammogram is scheduled for 1/14 with a bone density. She still takes peridin C for hot flashes which have improved.  Medications: I have reviewed the patient's current medications.  Allergies:  Allergies  Allergen Reactions  . Morphine     REACTION: paranoid    Past Medical History, Surgical history, Social history, and Family History were reviewed and updated.  Review of Systems: Constitutional:  Negative for fever, chills, night sweats, anorexia, weight loss, pain. Cardiovascular: no chest pain or dyspnea on exertion Respiratory: no cough, shortness of breath, or wheezing Neurological: no TIA or stroke symptoms Dermatological: negative ENT: negative Skin Gastrointestinal: no abdominal pain, change in bowel habits, or black or bloody stools Genito-Urinary: no dysuria, trouble voiding, or hematuria Hematological and Lymphatic: negative Breast: negative for breast lumps Musculoskeletal: negative Remaining ROS negative.  Physical Exam: Blood pressure 155/84, pulse 111, temperature 98.2 F (36.8 C), temperature source Oral, resp. rate 20, height 5' 0.5" (1.537 m), weight 161 lb 3.2 oz (73.12 kg). ECOG: 0 General appearance: alert, cooperative and appears stated age Head: Normocephalic, without obvious abnormality, atraumatic Neck: no adenopathy, no carotid bruit, no JVD, supple, symmetrical, trachea midline and thyroid not enlarged, symmetric, no tenderness/mass/nodules Lymph nodes: Cervical,  supraclavicular, and axillary nodes normal. Cardiac : regular rate and rhythm, no murmurs or gallops Pulmonary:clear to auscultation bilaterally and normal percussion bilaterally Breasts: inspection negative, no nipple discharge or bleeding, no masses or nodularity palpable Abdomen:soft, non-tender; bowel sounds normal; no masses,  no organomegaly Extremities negative Neuro: alert, oriented, normal speech, no focal findings or movement disorder noted  Lab Results: Lab Results  Component Value Date   WBC 10.5 11/03/2012   HGB 8.7* 11/03/2012   HCT 26.5* 11/03/2012   MCV 88.9 11/03/2012   PLT 239 11/03/2012     Chemistry      Component Value Date/Time   NA 138 11/03/2012 0450   K 3.6 11/03/2012 0450   CL 103 11/03/2012 0450   CO2 25 11/03/2012 0450   BUN 17 11/03/2012 0450   CREATININE 1.10 11/03/2012 0450      Component Value Date/Time   CALCIUM 8.9 11/03/2012 0450   ALKPHOS 73 10/08/2011 0955   ALKPHOS 73 10/08/2011 0955   ALKPHOS 73 10/08/2011 0955   AST 18 10/08/2011 0955   AST 18 10/08/2011 0955   AST 18 10/08/2011 0955   ALT 18 10/08/2011 0955   ALT 18 10/08/2011 0955   ALT 18 10/08/2011 0955   BILITOT 0.3 10/08/2011 0955   BILITOT 0.3 10/08/2011 0955   BILITOT 0.3 10/08/2011 0955      .pathology. Radiological Studies: chest X-ray n/a Mammogram Due 1/14 Bone density Due 1/14  Impression and Plan: Gloria Fields is doing well. She has completed 5 y of AI therapyy and will stop . I will see her in 1 yr with f/u labs and imaging. She has chronic anemia, which has gotten worse . This is unexplained and will be worked with additional blood work.   More than 50% of the visit was spent in patient-related counselling  Pierce Crane, MD 12/26/20139:39 AM

## 2012-11-23 ENCOUNTER — Telehealth (INDEPENDENT_AMBULATORY_CARE_PROVIDER_SITE_OTHER): Payer: Self-pay

## 2012-11-23 ENCOUNTER — Encounter: Payer: Self-pay | Admitting: *Deleted

## 2012-11-23 LAB — FERRITIN: Ferritin: 122 ng/mL (ref 10–291)

## 2012-11-23 LAB — PROTEIN ELECTROPHORESIS, SERUM, WITH REFLEX
Albumin ELP: 57.9 % (ref 55.8–66.1)
Alpha-1-Globulin: 7.5 % — ABNORMAL HIGH (ref 2.9–4.9)
Alpha-2-Globulin: 12.1 % — ABNORMAL HIGH (ref 7.1–11.8)
Beta 2: 5.4 % (ref 3.2–6.5)
Beta Globulin: 6.7 % (ref 4.7–7.2)
Gamma Globulin: 10.4 % — ABNORMAL LOW (ref 11.1–18.8)
Total Protein, Serum Electrophoresis: 7.5 g/dL (ref 6.0–8.3)

## 2012-11-23 LAB — IRON AND TIBC
%SAT: 14 % — ABNORMAL LOW (ref 20–55)
Iron: 47 ug/dL (ref 42–145)
TIBC: 348 ug/dL (ref 250–470)
UIBC: 301 ug/dL (ref 125–400)

## 2012-11-23 LAB — ERYTHROPOIETIN: Erythropoietin: 10.9 m[IU]/mL (ref 2.6–34.0)

## 2012-11-23 LAB — FOLATE RBC: RBC Folate: 1412 ng/mL (ref >=366)

## 2012-11-23 LAB — VITAMIN B12: Vitamin B-12: 562 pg/mL (ref 211–911)

## 2012-11-23 NOTE — Telephone Encounter (Signed)
Pt called stating she started having vomiting and diarrhea last night. She states vomiting has resolved this morning. Pt states she is concerned it could be from obstruction. No abd bloating. No fever. I reviewed this with Dr Daphine Deutscher via phone. He advised this may be GI virus. Per his order pt advised stay on fluids and gator aid for 24hrs and slowly increase diet after symptoms improve. She is to call symptoms due not continue to improve or abd pain and bloating occur.

## 2012-11-23 NOTE — Progress Notes (Signed)
11/23/12 at 10:05am - The pt signed the consent on 11/19/12.  The pt was mailed a copy of her signed consent form for the [id number] study on 11/23/12. The research nurse has reviewed the Step 3 eligibility checklist, and the pt is deemed eligible for step 3 registration.  Dr. Donnie Coffin feels the pt is eligible for the study.  The research nurse will proceed with the pt's step 3 registration today.  11/23/12 at 10:37am - The pt was successfully registered to step 3 on 11/23/12.  Per Adella Nissen, the sample will be shipped within the next 3 months.  The research nurse called the pt and informed her that she is registered to the study.  The nurse also told the pt that she should receive a copy of her signed consent in the mail this week.  The pt confirmed that she has never smoked.  She also reports that she has not taken any hormone therapy in the past 7 days.  She said she stopped her Arimidex in December of 2012.  The pt was thanked for her support of the study.

## 2012-11-27 ENCOUNTER — Ambulatory Visit
Admission: RE | Admit: 2012-11-27 | Discharge: 2012-11-27 | Disposition: A | Discharge status: Home / Self Care | Payer: Medicare PPO | Source: Ambulatory Visit | Attending: Oncology | Admitting: Oncology

## 2012-12-14 ENCOUNTER — Encounter (INDEPENDENT_AMBULATORY_CARE_PROVIDER_SITE_OTHER): Payer: Self-pay

## 2012-12-17 ENCOUNTER — Ambulatory Visit (INDEPENDENT_AMBULATORY_CARE_PROVIDER_SITE_OTHER): Payer: Medicare PPO | Admitting: Gastroenterology

## 2012-12-17 ENCOUNTER — Encounter: Payer: Self-pay | Admitting: Gastroenterology

## 2012-12-17 VITALS — BP 142/72 | HR 100 | Ht 60.6 in | Wt 163.2 lb

## 2012-12-17 DIAGNOSIS — Z9889 Other specified postprocedural states: Secondary | ICD-10-CM

## 2012-12-17 DIAGNOSIS — R1084 Generalized abdominal pain: Secondary | ICD-10-CM

## 2012-12-17 DIAGNOSIS — K3184 Gastroparesis: Secondary | ICD-10-CM

## 2012-12-17 DIAGNOSIS — R112 Nausea with vomiting, unspecified: Secondary | ICD-10-CM

## 2012-12-17 DIAGNOSIS — F988 Other specified behavioral and emotional disorders with onset usually occurring in childhood and adolescence: Secondary | ICD-10-CM

## 2012-12-17 DIAGNOSIS — Z8719 Personal history of other diseases of the digestive system: Secondary | ICD-10-CM

## 2012-12-17 DIAGNOSIS — R109 Unspecified abdominal pain: Secondary | ICD-10-CM

## 2012-12-17 DIAGNOSIS — D649 Anemia, unspecified: Secondary | ICD-10-CM

## 2012-12-17 MED ORDER — SUCRALFATE 1 GM/10ML PO SUSP: ORAL | Status: DC

## 2012-12-17 NOTE — Progress Notes (Signed)
This is a very confusing 67 year old Caucasian female with multiple psychiatric problems on Adderall 10 mg twice a day, Klonopin 1 mg 4 times a day, and Zoloft 100 mg a day.  She has a long history of a ventral hernia with recent repeat repair by Dr. Luretha Murphy.  She's done well from a surgical standpoint after reviewing his clinic notes, but she is now referred from [facility] because of recurrent nausea and vomiting.  This is been a problem for this patient for many years, and has been felt previously to be secondary to intestinal adhesions.  CT scans and ultrasounds have all been unremarkable.  Endoscopy one half years ago suggested possible pyloric stenosis, and she had endoscopic dilation of a porous, and was placed on Dexilant 60 mg a day.  Patient generally is doing well but continues to have intermittent "episodes" of  "rotten Egg" regurgitation and emesis.  She's had no constant pain, loss of weight, or any hepatobiliary complaints.  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  She denies alcohol, NSAID, cigarette abuse.  There is no true dysphagia or specific hepatobiliary complaints.  Colonoscopy in 2010 was normal except for mild diverticulosis.  She has a chronic anemia with a hemoglobin of 11 and normal  normocytic  indices.  She denies melena or hematochezia.   Current Medications, Allergies, Past Medical History, Past Surgical History, Family History and Social History were reviewed in [employer] record.  ROS: All systems were reviewed and are negative unless otherwise stated in the HPI.          Physical Exam: The appearing nontoxic female in no distress.  Blood pressure today 142/72, pulse 100 and regular and weight under and 53 pounds with a BMI of 31.24.  I cannot appreciate stigmata of chronic liver disease.  Chest is clear and she is in a regular rhythm without murmurs gallops or rubs.  Her abdomen shows a  well-healed midline abdominal scar without hepatosplenomegaly, masses, localized tenderness, or abnormal bowel sounds.  I cannot appreciate a succussion splash.  Mental status is normal and peripheral extremities are unremarkable.    Assessment and Plan: This is a difficult patient to evaluate because of her multiple medications/psych hx., including daily Adderall.  She obviously has had intestinal adhesions which were recently surgically lysed , and also she had repeat repair of a ventral hernia.  This certainly no evidence of repeat herniation on abdominal exam, and bowel sounds are nonobstructive.  She probably has chronic intestinal adhesions with intermittent small bowel obstruction, but she does not appear chronically compromised.  However I will repeat her endoscopy to be sure that she does not have recurrent pyloric stenosis, we'll continue current medications and add PC liquid Carafate, have placed her on a step 3 gastroparesis diet. If her endoscopy is unremarkable, we may need to perform CT enteroscopy.  I do not think she needs repeat gallbladder ultrasound repeat colonoscopy at this time.  She has Phenergan to use when necessary for nausea. No diagnosis found.

## 2012-12-17 NOTE — Patient Instructions (Signed)
You have been scheduled for an endoscopy with propofol. Please follow written instructions given to you at your visit today. If you use inhalers (even only as needed) or a CPAP machine, please bring them with you on the day of your procedure.  We have sent the following medications to your pharmacy for you to pick up at your convenience: Carafate, please take as directed.  Please follow Step 3 in the Gastroparesis diet information given. _________________________________________________________________________________________________________________

## 2012-12-23 ENCOUNTER — Encounter: Payer: Self-pay | Admitting: Gastroenterology

## 2012-12-23 ENCOUNTER — Ambulatory Visit (AMBULATORY_SURGERY_CENTER): Payer: Medicare PPO | Admitting: Gastroenterology

## 2012-12-23 VITALS — BP 151/72 | HR 77 | Temp 97.9°F | Resp 21 | Ht 60.0 in | Wt 163.0 lb

## 2012-12-23 DIAGNOSIS — K3184 Gastroparesis: Secondary | ICD-10-CM

## 2012-12-23 DIAGNOSIS — K2950 Unspecified chronic gastritis without bleeding: Secondary | ICD-10-CM

## 2012-12-23 DIAGNOSIS — K2970 Gastritis, unspecified, without bleeding: Secondary | ICD-10-CM

## 2012-12-23 DIAGNOSIS — K2990 Gastroduodenitis, unspecified, without bleeding: Secondary | ICD-10-CM

## 2012-12-23 DIAGNOSIS — K2940 Chronic atrophic gastritis without bleeding: Secondary | ICD-10-CM

## 2012-12-23 DIAGNOSIS — K311 Adult hypertrophic pyloric stenosis: Secondary | ICD-10-CM

## 2012-12-23 MED ORDER — SODIUM CHLORIDE 0.9 % IV SOLN: 500.0000 mL | INTRAVENOUS | Status: DC

## 2012-12-23 NOTE — Addendum Note (Signed)
Addended by: Lucia Gaskins on: 12/23/2012 04:30 PM   Modules accepted: Orders

## 2012-12-23 NOTE — Patient Instructions (Signed)
Soft diet today. Resume regular diet tomorrow. See handout for soft diet. Resume current medications. Call us with any questions or concerns. Thank you!!  YOU HAD AN ENDOSCOPIC PROCEDURE TODAY AT THE [facility]: Refer to the procedure report that was given to you for any specific questions about what was found during the examination.  If the procedure report does not answer your questions, please call your gastroenterologist to clarify.  If you requested that your care partner not be given the details of your procedure findings, then the procedure report has been included in a sealed envelope for you to review at your convenience later.  YOU SHOULD EXPECT: Some feelings of bloating in the abdomen. Passage of more gas than usual.  Walking can help get rid of the air that was put into your GI tract during the procedure and reduce the bloating. If you had a lower endoscopy (such as a colonoscopy or flexible sigmoidoscopy) you may notice spotting of blood in your stool or on the toilet paper. If you underwent a bowel prep for your procedure, then you may not have a normal bowel movement for a few days.  DIET: Your first meal following the procedure should be a light meal and then it is ok to progress to your normal diet.  A half-sandwich or bowl of soup is an example of a good first meal.  Heavy or fried foods are harder to digest and may make you feel nauseous or bloated.  Likewise meals heavy in dairy and vegetables can cause extra gas to form and this can also increase the bloating.  Drink plenty of fluids but you should avoid alcoholic beverages for 24 hours.  ACTIVITY: Your care partner should take you home directly after the procedure.  You should plan to take it easy, moving slowly for the rest of the day.  You can resume normal activity the day after the procedure however you should NOT DRIVE or use heavy machinery for 24 hours (because of the sedation medicines used during the test).     SYMPTOMS TO REPORT IMMEDIATELY: A gastroenterologist can be reached at any hour.  During normal business hours, 8:30 AM to 5:00 PM Monday through Friday, call [phone].  After hours and on weekends, please call the GI answering service at [phone] who will take a message and have the physician on call contact you.   Following upper endoscopy (EGD)  Vomiting of blood or coffee ground material  New chest pain or pain under the shoulder blades  Painful or persistently difficult swallowing  New shortness of breath  Fever of 100F or higher  Black, tarry-looking stools  FOLLOW UP: If any biopsies were taken you will be contacted by phone or by letter within the next 1-3 weeks.  Call your gastroenterologist if you have not heard about the biopsies in 3 weeks.  Our staff will call the home number listed on your records the next business day following your procedure to check on you and address any questions or concerns that you may have at that time regarding the information given to you following your procedure. This is a courtesy call and so if there is no answer at the home number and we have not heard from you through the emergency physician on call, we will assume that you have returned to your regular daily activities without incident.  SIGNATURES/CONFIDENTIALITY: You and/or your care partner have signed paperwork which will be entered into your electronic medical record.  These signatures attest to the fact that that the information above on your After Visit Summary has been reviewed and is understood.  Full responsibility of the confidentiality of this discharge information lies with you and/or your care-partner.  

## 2012-12-23 NOTE — Progress Notes (Signed)
Lidocaine-40mg IV prior to Propofol InductionPropofol given over incremental dosages 

## 2012-12-23 NOTE — Progress Notes (Signed)
Patient did not experience any of the following events: a burn prior to discharge; a fall within the facility; wrong site/side/patient/procedure/implant event; or a hospital transfer or hospital admission upon discharge from the facility. (G8907) Patient did not have preoperative order for IV antibiotic SSI prophylaxis. (G8918)  

## 2012-12-23 NOTE — Progress Notes (Signed)
Called to room to assist during endoscopic procedure.  Patient ID and intended procedure confirmed with present staff. Received instructions for my participation in the procedure from the performing physician.  

## 2012-12-23 NOTE — Op Note (Signed)
[facility] [address] [place], [address]   ENDOSCOPY PROCEDURE REPORT  PATIENT: Gloria Fields, Gloria Fields  MR#: [id number] BIRTHDATE: [birth year] , 66  yrs. old GENDER: Female ENDOSCOPIST:Takari Duncombe Hale Bogus, MD, [facility] REFERRED BY: PROCEDURE DATE:  12/23/2012 PROCEDURE:   EGD w/ biopsy and EGD w/ biopsy for H.pylori ASA CLASS:    Class II INDICATIONS: Epigastric pain. 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 Previous endoscopic exams  have suggested pyloric stenosis. MEDICATION: There was residual sedation effect present from prior procedure, Glycopyrrolate (Robinul) 0.2 mg IV, and propofol (Diprivan) 200mg  IV TOPICAL ANESTHETIC:  DESCRIPTION OF PROCEDURE:   After the risks and benefits of the procedure were explained, informed consent was obtained.  The LB GIF-H180 [id number]  endoscope was introduced through the mouth  and advanced to the second portion of the duodenum .  The instrument was slowly withdrawn as the mucosa was fully examined.      ESOPHAGUS: The mucosa of the esophagus appeared normal.  STOMACH: There are patchy areas of submucosal hemorrhage that were biopsied, and CLO biopsy also was performed.  There were no retained food products in the stomach. There was a stenotic pylorus which was dilated with the endoscope, please see pictures for documentation.  DUODENUM: The duodenal mucosa showed no abnormalities in the bulb and second portion of the duodenum.    Retroflexed views revealed no abnormalities.    The scope was then withdrawn from the patient and the procedure completed.  COMPLICATIONS: There were no complications.   ENDOSCOPIC IMPRESSION: 1.   The mucosa of the esophagus appeared normal . 2.   There are patchy areas of submucosal hemorrhage in the fundus that were biopsied, and CLO biopsy also was performed.  There were no retained food products in the stomach.  Chronic pyloric  stenosis dilated,probable prior PUD,R/O H.pylori... 3.   The duodenal mucosa showed no abnormalities in the bulb and second portion of the duodenum  RECOMMENDATIONS: 1.  Await biopsy results 2.  Follow clinical response to dilatation. 3.  Continue current medications 4. May need CT enteroscopy   _______________________________ eSigned:  Mardella Layman, MD, [facility] 12/23/2012 3:07 PM   standard discharge   PATIENT NAME:  Gloria Fields, Gloria Fields MR#: [id number]

## 2012-12-24 ENCOUNTER — Telehealth: Payer: Self-pay | Admitting: *Deleted

## 2012-12-24 LAB — HELICOBACTER PYLORI SCREEN-BIOPSY: UREASE: NEGATIVE

## 2012-12-24 NOTE — Telephone Encounter (Signed)
  Follow up Call-  Call back number 12/23/2012 08/28/2011  Post procedure Call Back phone  # [phone]  Permission to leave phone message Yes -     Patient questions:  Do you have a fever, pain , or abdominal swelling? no Pain Score  0 *  Have you tolerated food without any problems? yes  Have you been able to return to your normal activities? yes  Do you have any questions about your discharge instructions: Diet   no Medications  no Follow up visit  no  Do you have questions or concerns about your Care? no  Actions: * If pain score is 4 or above: No action needed, pain <4.pt did say she felt like bad reflux or like something hung in throat this morning but that feeling gone now.

## 2012-12-25 ENCOUNTER — Ambulatory Visit (HOSPITAL_BASED_OUTPATIENT_CLINIC_OR_DEPARTMENT_OTHER): Payer: Medicare PPO | Admitting: Oncology

## 2012-12-25 ENCOUNTER — Encounter: Payer: Self-pay | Admitting: Oncology

## 2012-12-25 ENCOUNTER — Encounter: Payer: Self-pay | Admitting: Gastroenterology

## 2012-12-25 ENCOUNTER — Telehealth: Payer: Self-pay | Admitting: Oncology

## 2012-12-25 VITALS — BP 153/73 | HR 101 | Temp 98.5°F | Resp 20 | Ht 60.0 in | Wt 164.0 lb

## 2012-12-25 DIAGNOSIS — C50919 Malignant neoplasm of unspecified site of unspecified female breast: Secondary | ICD-10-CM

## 2012-12-25 DIAGNOSIS — Z853 Personal history of malignant neoplasm of breast: Secondary | ICD-10-CM

## 2012-12-25 DIAGNOSIS — D649 Anemia, unspecified: Secondary | ICD-10-CM

## 2012-12-25 DIAGNOSIS — Z170 Estrogen receptor positive status [ER+]: Secondary | ICD-10-CM

## 2012-12-25 HISTORY — DX: Anemia, unspecified: D64.9

## 2012-12-25 NOTE — Progress Notes (Signed)
Hematology and Oncology Follow Up Visit  Gloria Fields [id number] [birth year] 67 y.o. 12/25/2012 8:17 PM   Principle Diagnosis: Encounter Diagnosis  Name Primary?  . Breast cancer, stage 1, estrogen receptor positive Yes     Interim History:  I will be assuming the hematology oncology care for this patient since Dr. Donnie Coffin left the practice.  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 She underwent lumpectomy 11/04/2006. Estrogen receptor 99% positive, progesterone receptor 74%, Ki67 proliferation marker 6%, HER-2/neu by fish no amplification. She received breast radiation followed by 5 years of Arimidex.  At time of a recent 11/19/2012 office visit, the epic computer system to the CBC into the progress note that showed a hemoglobin of 8.7 done on 11/03/2012. Anemia evaluation ensued. Laboratory on December 26 included iron studies which were normal with serum iron 47, TIBC 348, percent saturation 14, ferritin 122. Reticulocyte count 1.8%. 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 Normal renal function with BUN 17 creatinine 1.0.  In reviewing the chart record, I see that she was admitted to the hospital on December 5 for an abdominal wall hernia repair by Dr. Wenda Low. Preoperative hemoglobin done on December 2 was 11.5 g and this has been her baseline for at least the last 6 years with laboratory back to 11/26/2006 showing a hemoglobin of 11.7, MCV 87. She lost blood during the surgery and hemoglobin fell to 8.7 g by December 10. Of note at time of her last visit here on December 26 hemoglobin was  already back to her baseline of 11.8 g. Also of note at time of the 10/26/2012 admission was dehydration with a BUN of 48 and a creatinine of 1.9 which likely also contributed to the transient anemia. By time of discharge, on 11/03/2012, BUN and creatinine had  returned to baseline and were 17 and 1.1 respectively.  She has had some chronic, fluctuating, GI symptoms. She just had a reevaluation by Dr. Sheryn Bison last week. She has intermittent nausea and vomiting likely due to adhesions from previous surgery related to a ruptured appendix 5 years ago. She was in intensive care for 2 weeks. Last week she had a followup upper endoscopy on January 23. There are findings of chronic pyloric stenosis. There was some mild gastritis. No active ulcers or tumor. Most recent colonoscopy done in 2010 negative except for benign polyps which were removed. She reports no hematochezia or melena. No hematuria. No vaginal bleeding.  There is no family history of any bleeding disorder. Father died at age 2 of a chronic illness to be an immune deficiency. He had had his spleen removed. He was on steroids. Mother died at 89 of a subdural hematoma after a fall. She has one brother who is healthy.  She has a psychiatric history and takes Adderall Klonopin and Zoloft.   Medications: reviewed  Allergies:  Allergies  Allergen Reactions  . Morphine     REACTION: paranoid    Review of Systems: Constitutional:   No constitutional symptoms Respiratory: No cough or dyspnea Cardiovascular:  No chest pain or palpitations Gastrointestinal: See above ; she has reflux esophagitis but has never had any ulcers. Genito-Urinary:  See above; she has a history of endometriosis and has had to have a number of surgeries in the past to remove implants. Unfortunately she was never able to conceive. Musculoskeletal: No muscle bone or joint pain Neurologic: No headache or change in vision Skin: No rash or ecchymosis Remaining ROS  negative.  Physical Exam: Blood pressure 153/73, pulse 101, temperature 98.5 F (36.9 C), temperature source Oral, resp. rate 20, height 5' (1.524 m), weight 164 lb (74.39 kg). Wt Readings from Last 3 Encounters:  12/25/12 164 lb (74.39 kg)  12/23/12 163 lb (73.936 kg)  12/17/12 163 lb 3.2 oz (74.027 kg)     General appearance: Well-nourished Caucasian woman HENNT: Pharynx no erythema or exudate Lymph nodes: No adenopathy Breasts: Not examined today. Recently examined by Dr. Donnie Coffin Lungs: Clear to auscultation resonant to percussion Heart: Regular rhythm no murmur Abdomen: Soft, nontender, midline abdominal scar, no mass, no organomegaly Extremities: No edema, no calf tenderness Vascular: No cyanosis Neurologic: Mental status intact, optic discs not well visualized, motor strength 5 over 5, reflexes 1+ symmetric, sensation intact to vibration over the fingertips by tuning fork exam Skin: No rash or ecchymosis  Lab Results: Lab Results  Component Value Date   WBC 8.5 11/19/2012   HGB 11.8 11/19/2012   HCT 35.7 11/19/2012   MCV 88.4 11/19/2012   PLT 308 11/19/2012     Chemistry      Component Value Date/Time   NA 138 11/03/2012 0450   K 3.6 11/03/2012 0450   CL 103 11/03/2012 0450   CO2 25 11/03/2012 0450   BUN 17 11/03/2012 0450   CREATININE 1.10 11/03/2012 0450      Component Value Date/Time   CALCIUM 8.9 11/03/2012 0450   ALKPHOS 73 10/08/2011 0955   AST 18 10/08/2011 0955   ALT 18 10/08/2011 0955   BILITOT 0.3 10/08/2011 0955       Radiological Studies:   Mm Digital Diagnostic Bilat  11/27/2012  *RADIOLOGY REPORT*  Clinical Data:  Right lumpectomy, 2007.  Asymptomatic.  DIGITAL DIAGNOSTIC BILATERAL MAMMOGRAM WITH CAD  Comparison: Multiple prior  Findings:  ACR Breast Density Category 2: There is a scattered fibroglandular pattern.  No suspicious mass or calcifications are seen in either breast.  Mammographic images were processed with CAD.  IMPRESSION: No  mammographic evidence of local recurrence, right breast.  No mammographic evidence of malignancy, left breast.  RECOMMENDATION: Diagnostic mammography in 1 year.  I have discussed the findings and recommendations with the patient. Results were also provided in writing at the conclusion of the visit.  BI-RADS CATEGORY 1:  Negative.   Original Report Authenticated By: Vincenza Hews, M.D.     Impression and Plan: #1. Stage II ,2.6 cm, ER/PR positive HER-2 negative cancer right breast treated as outlined above. No evidence for new disease now out 6 years from diagnosis. She can be seen on an annual basis at this time.  #2. Chronic, mild, stable, normochromic, anemia with hemoglobin at 12+ or -0.5 g at least since January 2008. Recent fall in hemoglobin correlated with hospitalization for a ventral hernia repair. Hemoglobin now returned to her baseline. Screening studies outlined above all normal. No further evaluation indicated at this time.  #3. 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  #4. Endometriosis.  #5. Pyloric stenosis.  #6. Benign colon polyps.  #7. Chronic depression  #8. Status post ventral hernia repair x2.  #9 GERD   CC:. Noell. Chena Ridge PA  with [facility]; Dr. Luretha Murphy; Dr. Sheryn Bison   Levert Feinstein, MD 1/31/20148:17 PM

## 2012-12-30 ENCOUNTER — Encounter: Payer: Self-pay | Admitting: Gastroenterology

## 2013-01-15 ENCOUNTER — Ambulatory Visit (INDEPENDENT_AMBULATORY_CARE_PROVIDER_SITE_OTHER): Payer: Medicare PPO | Admitting: Surgery

## 2013-01-15 VITALS — BP 122/68 | HR 98 | Temp 97.2°F | Resp 18 | Ht 59.75 in | Wt 159.2 lb

## 2013-01-15 DIAGNOSIS — K56609 Unspecified intestinal obstruction, unspecified as to partial versus complete obstruction: Secondary | ICD-10-CM

## 2013-01-15 NOTE — Progress Notes (Signed)
Gloria Fields 67 y.o.  Body mass index is 31.34 kg/(m^2).  Patient Active Problem List  Diagnosis  . GERD (gastroesophageal reflux disease)  . Depression, major, recurrent  . Anxiety disorder  . Diverticulosis of colon (without mention of hemorrhage)  . Nausea with vomiting  . Esophageal reflux  . Pyloric channel ulcer  . Anemia, normocytic normochromic    Allergies  Allergen Reactions  . Morphine     REACTION: paranoid    Past Surgical History  Procedure Laterality Date  . Appendectomy    . Hernia repair    . Uterine fibroid surgery      with subsequent scar tissue removal  . Bowel perforation  2007  . Breast lumpectomy  2007    right  . Ventral hernia repair  10/29/2012    Procedure: LAPAROSCOPIC VENTRAL HERNIA;  Surgeon: Valarie Merino, MD;  Location: [facility];  Service: General;  Laterality: N/A;  LAPRASCOPIC  ASSISTED OPEN VENTRAL HERNIA REPAIR, LAPRASCOPIC LYSIS OF ADHESIONS   . Laparoscopic lysis of adhesions  10/29/2012    Procedure: LAPAROSCOPIC LYSIS OF ADHESIONS;  Surgeon: Valarie Merino, MD;  Location: [facility];  Service: General;;  . Wisdom tooth extraction     REDMON,NOELLE, PA No diagnosis found.  We had a long discussion with Ms. Koeneman and her daughter And every 3 weeks she will have a sulfur smelling burp followed by vomiting and diarrhea. She does eat different sugar supplements including Splenda and add that to her diet.  I had hoped that my enteral lysis done back in December would cure this but it hasn't. I want to get either a small bowel clysis study or a CT enterography TCU she has any narrowing or kinked areas in her small bowel that are contributing to this. He'll see her back after these studies to [facility] B. Daphine Deutscher, MD, [facility] [phone] beeper [phone]  01/15/2013 10:48 AM

## 2013-01-15 NOTE — Patient Instructions (Signed)
Thanks for your patience.  If you need further assistance after leaving the office, please call our office and speak with a [facility] nurse.  [phone].  If you want to leave a message for Dr. Fong Mccarry, please call his office phone at [phone]. 

## 2013-01-26 ENCOUNTER — Other Ambulatory Visit: Payer: Medicare PPO

## 2013-02-04 ENCOUNTER — Ambulatory Visit
Admission: RE | Admit: 2013-02-04 | Discharge: 2013-02-04 | Disposition: A | Discharge status: Home / Self Care | Payer: Medicare PPO | Source: Ambulatory Visit | Attending: Surgery | Admitting: Surgery

## 2013-02-04 DIAGNOSIS — K56609 Unspecified intestinal obstruction, unspecified as to partial versus complete obstruction: Secondary | ICD-10-CM

## 2013-02-24 ENCOUNTER — Other Ambulatory Visit: Payer: Self-pay | Admitting: Gastroenterology

## 2013-03-31 ENCOUNTER — Other Ambulatory Visit (HOSPITAL_COMMUNITY): Payer: Self-pay | Admitting: *Deleted

## 2013-04-01 ENCOUNTER — Encounter (HOSPITAL_COMMUNITY)
Admission: RE | Admit: 2013-04-01 | Discharge: 2013-04-01 | Disposition: A | Discharge status: Home / Self Care | Payer: Medicare PPO | Source: Ambulatory Visit | Attending: Nephrology | Admitting: Nephrology

## 2013-04-01 DIAGNOSIS — D509 Iron deficiency anemia, unspecified: Secondary | ICD-10-CM | POA: Insufficient documentation

## 2013-04-01 MED ORDER — SODIUM CHLORIDE 0.9 % IV SOLN
1020.0000 mg | Freq: Once | INTRAVENOUS | Status: AC
  Administered 2013-04-01: 1020 mg via INTRAVENOUS
  Filled 2013-04-01: qty 34

## 2013-04-29 ENCOUNTER — Other Ambulatory Visit: Payer: Self-pay | Admitting: Gastroenterology

## 2013-11-29 ENCOUNTER — Ambulatory Visit: Payer: Medicare Other | Admitting: Oncology

## 2013-12-22 ENCOUNTER — Other Ambulatory Visit: Payer: Self-pay | Admitting: Oncology

## 2013-12-22 DIAGNOSIS — C50919 Malignant neoplasm of unspecified site of unspecified female breast: Secondary | ICD-10-CM

## 2013-12-22 DIAGNOSIS — Z9889 Other specified postprocedural states: Secondary | ICD-10-CM

## 2013-12-22 DIAGNOSIS — Z853 Personal history of malignant neoplasm of breast: Secondary | ICD-10-CM

## 2013-12-22 DIAGNOSIS — Z170 Estrogen receptor positive status [ER+]: Principal | ICD-10-CM

## 2013-12-24 ENCOUNTER — Telehealth: Payer: Self-pay | Admitting: Oncology

## 2013-12-24 ENCOUNTER — Other Ambulatory Visit (HOSPITAL_BASED_OUTPATIENT_CLINIC_OR_DEPARTMENT_OTHER): Payer: Medicare PPO

## 2013-12-24 ENCOUNTER — Ambulatory Visit (HOSPITAL_BASED_OUTPATIENT_CLINIC_OR_DEPARTMENT_OTHER): Payer: Medicare PPO | Admitting: Oncology

## 2013-12-24 ENCOUNTER — Encounter (INDEPENDENT_AMBULATORY_CARE_PROVIDER_SITE_OTHER): Payer: Self-pay

## 2013-12-24 ENCOUNTER — Encounter: Payer: Self-pay | Admitting: Oncology

## 2013-12-24 VITALS — BP 126/75 | HR 100 | Temp 97.4°F | Resp 18 | Ht 59.0 in | Wt 156.0 lb

## 2013-12-24 DIAGNOSIS — C50519 Malignant neoplasm of lower-outer quadrant of unspecified female breast: Secondary | ICD-10-CM | POA: Insufficient documentation

## 2013-12-24 DIAGNOSIS — D649 Anemia, unspecified: Secondary | ICD-10-CM

## 2013-12-24 DIAGNOSIS — Z853 Personal history of malignant neoplasm of breast: Secondary | ICD-10-CM

## 2013-12-24 DIAGNOSIS — C50919 Malignant neoplasm of unspecified site of unspecified female breast: Secondary | ICD-10-CM

## 2013-12-24 DIAGNOSIS — Z170 Estrogen receptor positive status [ER+]: Principal | ICD-10-CM

## 2013-12-24 HISTORY — DX: Malignant neoplasm of lower-outer quadrant of unspecified female breast: C50.519

## 2013-12-24 LAB — CBC WITH DIFFERENTIAL/PLATELET
BASO%: 1.1 % (ref 0.0–2.0)
Basophils Absolute: 0.1 10*3/uL (ref 0.0–0.1)
EOS%: 3.6 % (ref 0.0–7.0)
Eosinophils Absolute: 0.2 10*3/uL (ref 0.0–0.5)
HCT: 37.3 % (ref 34.8–46.6)
HGB: 12.5 g/dL (ref 11.6–15.9)
LYMPH%: 27.6 % (ref 14.0–49.7)
MCH: 29.6 pg (ref 25.1–34.0)
MCHC: 33.6 g/dL (ref 31.5–36.0)
MCV: 88.1 fL (ref 79.5–101.0)
MONO#: 0.5 10*3/uL (ref 0.1–0.9)
MONO%: 7.2 % (ref 0.0–14.0)
NEUT#: 4.1 10*3/uL (ref 1.5–6.5)
NEUT%: 60.5 % (ref 38.4–76.8)
Platelets: 211 10*3/uL (ref 145–400)
RBC: 4.23 10*6/uL (ref 3.70–5.45)
RDW: 12.7 % (ref 11.2–14.5)
WBC: 6.7 10*3/uL (ref 3.9–10.3)
lymph#: 1.9 10*3/uL (ref 0.9–3.3)

## 2013-12-24 LAB — COMPREHENSIVE METABOLIC PANEL (CC13)
ALT: 19 U/L (ref 0–55)
AST: 21 U/L (ref 5–34)
Albumin: 4.4 g/dL (ref 3.5–5.0)
Alkaline Phosphatase: 84 U/L (ref 40–150)
Anion Gap: 12 meq/L — ABNORMAL HIGH (ref 3–11)
BUN: 35.7 mg/dL — ABNORMAL HIGH (ref 7.0–26.0)
CO2: 24 meq/L (ref 22–29)
Calcium: 9.9 mg/dL (ref 8.4–10.4)
Chloride: 103 meq/L (ref 98–109)
Creatinine: 1.4 mg/dL — ABNORMAL HIGH (ref 0.6–1.1)
Glucose: 100 mg/dL (ref 70–140)
Potassium: 4.1 meq/L (ref 3.5–5.1)
Sodium: 138 meq/L (ref 136–145)
Total Bilirubin: 0.3 mg/dL (ref 0.20–1.20)
Total Protein: 7.7 g/dL (ref 6.4–8.3)

## 2013-12-24 NOTE — Telephone Encounter (Signed)
Gave pt appt for lab and MD 2016 °

## 2013-12-24 NOTE — Progress Notes (Signed)
Hematology and Oncology Follow Up Visit  Gloria Fields [id number] 1946-10-20 68 y.o. 12/24/2013 3:09 PM   Principle Diagnosis: Encounter Diagnosis  Name Primary?  . 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 She underwent lumpectomy 11/04/2006. Estrogen receptor 99% positive, progesterone receptor 74%, Ki67 proliferation marker 6%, HER-2/neu by fish no amplification. She received breast radiation followed by 5 years of Arimidex. She has been in remission for over 7 years. Most recent mammogram done on 11/27/2012 showed no new disease. She was most of a followup study done prior to this visit but it got scheduled for next week. She has had no interim medical problems. She denies any headache or change in vision. No bone pain. No change in bowel habit. She tends to have loose bowels. She has history of intermittent bowel obstruction from adhesions. She denies any vaginal bleeding or discharge.  She was also evaluated through this office for anemia. Please see my 12/25/2012 note for details of her evaluation. At the end of the day, it was likely that she had a transient fall in her hemoglobin related to redo ventral hernia repair. Hernia has popped out again.   Medications: reviewed  Allergies:  Allergies  Allergen Reactions  . Morphine     REACTION: paranoid    Review of Systems: Hematology:  No bleeding or bruising ENT ROS: No sore throat Breast ROS: No new breast lumps Respiratory ROS: No cough or dyspnea Cardiovascular ROS:  No chest pain or palpitation Gastrointestinal ROS:   She still gets intermittent catching-type pains primarily in the right flank  area. Genito-Urinary ROS: See above Musculoskeletal ROS: See above Neurological ROS: No headache or change in vision Dermatological ROS: No rash or ecchymosis Remaining ROS negative:   Physical Exam: Blood pressure 126/75, pulse 100, temperature 97.4 F (36.3 C), temperature source Oral, resp. rate 18, height _0  (1.499 m), weight 156 lb (70.761 kg). Wt Readings from Last 3 Encounters:  12/24/13 156 lb (70.761 kg)  04/01/13 167 lb (75.751 kg)  01/15/13 159 lb 3.2 oz (72.213 kg)     General appearance: Well-nourished Caucasian woman HENNT: Pharynx no erythema, exudate, mass, or ulcer. No thyromegaly or thyroid nodules Lymph nodes: No cervical, supraclavicular, or axillary lymphadenopathy Breasts: No abnormal skin changes, no dominant mass in either breast. Thin scar lower-outer quadrant right breast status post prior lumpectomy. Lungs: Clear to auscultation, resonant to percussion throughout Heart: Regular rhythm, no murmur, no gallop, no rub, no click, no edema Abdomen: Soft, nontender, normal bowel sounds, no mass, no organomegaly Extremities: No edema, no calf tenderness Musculoskeletal: no joint deformities GU:  Vascular: Carotid pulses 2+, no bruits,  Neurologic: Alert, oriented, PERRLA, , cranial nerves grossly normal, motor strength 5 over 5, reflexes 1+ symmetric, upper body coordination normal, gait normal, Skin: No rash or ecchymosis  Lab Results: CBC W/Diff    Component Value Date/Time   WBC 6.7 12/24/2013 1203   WBC 10.5 11/03/2012 0450   RBC 4.23 12/24/2013 1203   RBC 2.98* 11/03/2012 0450   HGB 12.5 12/24/2013 1203   HGB 8.7* 11/03/2012 0450   HCT 37.3 12/24/2013 1203   HCT 26.5* 11/03/2012 0450   PLT 211 12/24/2013  1203   PLT 239 11/03/2012 0450   MCV 88.1 12/24/2013 1203   MCV 88.9 11/03/2012 0450   MCH 29.6 12/24/2013 1203   MCH 29.2 11/03/2012 0450   MCHC 33.6 12/24/2013 1203   MCHC 32.8 11/03/2012 0450   RDW 12.7 12/24/2013 1203   RDW 13.7 11/03/2012  0450   LYMPHSABS 1.9 12/24/2013 1203   LYMPHSABS 3.0 11/03/2012 0450   MONOABS 0.5 12/24/2013 1203   MONOABS 0.5 11/03/2012 0450   EOSABS 0.2 12/24/2013 1203   EOSABS 0.7 11/03/2012 0450   BASOSABS 0.1 12/24/2013 1203   BASOSABS 0.0 11/03/2012 0450     Chemistry      Component Value Date/Time   NA 138 12/24/2013 1203   NA 138 11/03/2012 0450   K 4.1 12/24/2013 1203   K 3.6 11/03/2012 0450   CL 103 11/03/2012 0450   CO2 24 12/24/2013 1203   CO2 25 11/03/2012 0450   BUN 35.7* 12/24/2013 1203   BUN 17 11/03/2012 0450   CREATININE 1.4* 12/24/2013 1203   CREATININE 1.10 11/03/2012 0450      Component Value Date/Time   CALCIUM 9.9 12/24/2013 1203   CALCIUM 8.9 11/03/2012 0450   ALKPHOS 84 12/24/2013 1203   ALKPHOS 73 10/08/2011 0955   AST 21 12/24/2013 1203   AST 18 10/08/2011 0955   ALT 19 12/24/2013 1203   ALT 18 10/08/2011 0955   BILITOT 0.30 12/24/2013 1203   BILITOT 0.3 10/08/2011 0955       Impression:  #1. Stage II ,2.6 cm, ER/PR positive HER-2 negative cancer right breast treated as outlined above.  No evidence for new disease now out 7 years from diagnosis.  She can be seen on an annual basis at this time. She would like to continue his to see one of our breast oncologist. I will transition her care to Dr. Jana Hakim.  #2. Chronic, mild, stable, normochromic, anemia with hemoglobin at 12+ or -0.5 g at least since January 2008. Fall in hemoglobin last year correlated with hospitalization for a ventral hernia repair. Hemoglobin now returned to her baseline.   #3. 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   #4. Endometriosis.   #5. Pyloric stenosis.   #6. Benign colon polyps.   #7. Chronic depression   #8. Status post ventral hernia repair x2.   #9 GERD    CC: Patient Care Team: Lennie Odor, PA-C as PCP - General (Nurse Practitioner)   Annia Belt, MD 1/30/20153:09 PM

## 2013-12-31 ENCOUNTER — Ambulatory Visit
Admission: RE | Admit: 2013-12-31 | Discharge: 2013-12-31 | Disposition: A | Discharge status: Home / Self Care | Payer: Medicare PPO | Source: Ambulatory Visit | Attending: Oncology | Admitting: Oncology

## 2013-12-31 DIAGNOSIS — Z9889 Other specified postprocedural states: Secondary | ICD-10-CM

## 2013-12-31 DIAGNOSIS — Z853 Personal history of malignant neoplasm of breast: Secondary | ICD-10-CM

## 2014-01-22 ENCOUNTER — Encounter: Payer: Self-pay | Admitting: Oncology

## 2014-01-29 ENCOUNTER — Encounter: Payer: Self-pay | Admitting: *Deleted

## 2014-02-22 ENCOUNTER — Telehealth: Payer: Self-pay | Admitting: Oncology

## 2014-02-22 NOTE — Telephone Encounter (Signed)
LEFT MESSAGE AND GAVE NEW D/T FOR 01/28 @ 1/DR. SHADAD.

## 2014-12-21 ENCOUNTER — Other Ambulatory Visit: Payer: Self-pay | Admitting: *Deleted

## 2014-12-21 DIAGNOSIS — C50511 Malignant neoplasm of lower-outer quadrant of right female breast: Secondary | ICD-10-CM

## 2014-12-22 ENCOUNTER — Other Ambulatory Visit: Payer: Self-pay

## 2014-12-22 ENCOUNTER — Ambulatory Visit (HOSPITAL_BASED_OUTPATIENT_CLINIC_OR_DEPARTMENT_OTHER): Payer: Medicare PPO | Admitting: Oncology

## 2014-12-22 ENCOUNTER — Ambulatory Visit: Payer: Medicare PPO | Admitting: Oncology

## 2014-12-22 ENCOUNTER — Other Ambulatory Visit (HOSPITAL_BASED_OUTPATIENT_CLINIC_OR_DEPARTMENT_OTHER): Payer: Medicare PPO

## 2014-12-22 ENCOUNTER — Other Ambulatory Visit: Payer: Medicare PPO

## 2014-12-22 ENCOUNTER — Telehealth: Payer: Self-pay | Admitting: Oncology

## 2014-12-22 VITALS — BP 132/59 | HR 92 | Temp 97.5°F | Resp 18 | Ht 59.0 in | Wt 168.2 lb

## 2014-12-22 DIAGNOSIS — C50519 Malignant neoplasm of lower-outer quadrant of unspecified female breast: Secondary | ICD-10-CM

## 2014-12-22 DIAGNOSIS — Z853 Personal history of malignant neoplasm of breast: Secondary | ICD-10-CM

## 2014-12-22 DIAGNOSIS — Z1231 Encounter for screening mammogram for malignant neoplasm of breast: Secondary | ICD-10-CM

## 2014-12-22 DIAGNOSIS — C50511 Malignant neoplasm of lower-outer quadrant of right female breast: Secondary | ICD-10-CM

## 2014-12-22 DIAGNOSIS — D649 Anemia, unspecified: Secondary | ICD-10-CM

## 2014-12-22 LAB — CBC WITH DIFFERENTIAL/PLATELET
BASO%: 0.9 % (ref 0.0–2.0)
Basophils Absolute: 0.1 10*3/uL (ref 0.0–0.1)
EOS%: 3.7 % (ref 0.0–7.0)
Eosinophils Absolute: 0.3 10*3/uL (ref 0.0–0.5)
HCT: 31.5 % — ABNORMAL LOW (ref 34.8–46.6)
HGB: 10.3 g/dL — ABNORMAL LOW (ref 11.6–15.9)
LYMPH%: 26 % (ref 14.0–49.7)
MCH: 28.6 pg (ref 25.1–34.0)
MCHC: 32.7 g/dL (ref 31.5–36.0)
MCV: 87.5 fL (ref 79.5–101.0)
MONO#: 0.9 10*3/uL (ref 0.1–0.9)
MONO%: 9.4 % (ref 0.0–14.0)
NEUT#: 5.5 10*3/uL (ref 1.5–6.5)
NEUT%: 60 % (ref 38.4–76.8)
Platelets: 237 10*3/uL (ref 145–400)
RBC: 3.6 10*6/uL — ABNORMAL LOW (ref 3.70–5.45)
RDW: 13.3 % (ref 11.2–14.5)
WBC: 9.2 10*3/uL (ref 3.9–10.3)
lymph#: 2.4 10*3/uL (ref 0.9–3.3)

## 2014-12-22 LAB — COMPREHENSIVE METABOLIC PANEL (CC13)
ALT: 16 U/L (ref 0–55)
AST: 18 U/L (ref 5–34)
Albumin: 3.9 g/dL (ref 3.5–5.0)
Alkaline Phosphatase: 82 U/L (ref 40–150)
Anion Gap: 11 mEq/L (ref 3–11)
BUN: 35.5 mg/dL — ABNORMAL HIGH (ref 7.0–26.0)
CO2: 21 mEq/L — ABNORMAL LOW (ref 22–29)
Calcium: 9.1 mg/dL (ref 8.4–10.4)
Chloride: 107 mEq/L (ref 98–109)
Creatinine: 1.2 mg/dL — ABNORMAL HIGH (ref 0.6–1.1)
EGFR: 45 mL/min/{1.73_m2} — ABNORMAL LOW (ref >90)
Glucose: 118 mg/dl (ref 70–140)
Potassium: 4.3 mEq/L (ref 3.5–5.1)
Sodium: 139 mEq/L (ref 136–145)
Total Bilirubin: 0.28 mg/dL (ref 0.20–1.20)
Total Protein: 7 g/dL (ref 6.4–8.3)

## 2014-12-22 NOTE — Progress Notes (Signed)
Hematology and Oncology Follow Up Visit  Gloria Fields [id number] January 07, 1946 69 y.o. 12/22/2014 1:52 PM   Principle Diagnosis: 69 year old woman diagnosed with breast cancer in 2007. 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 She also has chronic mild anemia.  Prior therapy: She underwent lumpectomy 11/04/2006. Estrogen receptor 99% positive, progesterone receptor 74%, Ki67 proliferation marker 6%, HER-2/neu by fish no amplification. She received breast radiation followed by 5 years of Arimidex. She has been in remission for over  years.  Interim History:   Gloria Fields presents today for a follow-up visit. Since the last visit, she have had some issues with upper respiratory tract infections including bronchitis and sinusitis. She reports that she is recovering better at this time. Her voice is improved and not reporting any fevers. She denies any headache or change in vision. No bone pain. No change in bowel habit. She tends to have loose bowels. She has history of intermittent bowel obstruction from adhesions. She denies any vaginal bleeding or discharge. She does not report any chest pain, palpitation, orthopnea or PND. She does not report any cough or hemoptysis. Rest of review of systems unremarkable.   Medications: reviewed  Allergies:  Allergies  Allergen Reactions  . Morphine     REACTION: paranoid     Physical Exam: Blood pressure 132/59, pulse 92, temperature 97.5 F (36.4 C), temperature source Oral, resp. rate 18, height '4\' 11"'  (1.499 m), weight 168 lb 3.2 oz (76.295 kg). Wt Readings from Last 3 Encounters:  12/22/14 168 lb 3.2 oz (76.295 kg)  12/24/13 156 lb (70.761 kg)  04/01/13 167 lb (75.751 kg)    ECOG 0 General appearance: Well-nourished Caucasian woman HENNT: Pharynx no erythema. Lymph nodes: No cervical, supraclavicular, or axillary  lymphadenopathy Lungs: Clear to auscultation, resonant to percussion throughout Heart: Regular rhythm, no murmur, no gallop, no rub, no click, no edema Abdomen: Soft, nontender, normal bowel sounds, no mass, no organomegaly Extremities: No edema, no calf tenderness Musculoskeletal: no joint deformities Vascular: Carotid pulses 2+, no bruits,  Neurologic: Alert, oriented, no deficits noted. Skin: No rash or ecchymosis  Lab Results: CBC W/Diff    Component Value Date/Time   WBC 9.2 12/22/2014 1319   WBC 10.5 11/03/2012 0450   RBC 3.60* 12/22/2014 1319   RBC 2.98* 11/03/2012 0450   HGB 10.3* 12/22/2014 1319   HGB 8.7* 11/03/2012 0450   HCT 31.5* 12/22/2014 1319   HCT 26.5* 11/03/2012 0450   PLT 237 12/22/2014 1319   PLT 239 11/03/2012 0450   MCV 87.5 12/22/2014 1319   MCV 88.9 11/03/2012 0450   MCH 28.6 12/22/2014 1319   MCH 29.2 11/03/2012 0450   MCHC 32.7 12/22/2014 1319   MCHC 32.8 11/03/2012 0450   RDW 13.3 12/22/2014 1319   RDW 13.7 11/03/2012 0450   LYMPHSABS 2.4 12/22/2014 1319   LYMPHSABS 3.0 11/03/2012 0450   MONOABS 0.9 12/22/2014 1319   MONOABS 0.5 11/03/2012 0450   EOSABS 0.3 12/22/2014 1319   EOSABS 0.7 11/03/2012 0450   BASOSABS 0.1 12/22/2014 1319   BASOSABS 0.0 11/03/2012 0450     Chemistry      Component Value Date/Time   NA 138 12/24/2013 1203   NA 138 11/03/2012 0450   K 4.1 12/24/2013 1203   K 3.6 11/03/2012 0450   CL 103 11/03/2012 0450   CO2 24 12/24/2013 1203   CO2 25 11/03/2012 0450  BUN 35.7* 12/24/2013 1203   BUN 17 11/03/2012 0450   CREATININE 1.4* 12/24/2013 1203   CREATININE 1.10 11/03/2012 0450      Component Value Date/Time   CALCIUM 9.9 12/24/2013 1203   CALCIUM 8.9 11/03/2012 0450   ALKPHOS 84 12/24/2013 1203   ALKPHOS 73 10/08/2011 0955   AST 21 12/24/2013 1203   AST 18 10/08/2011 0955   ALT 19 12/24/2013 1203   ALT 18 10/08/2011 0955   BILITOT 0.30 12/24/2013 1203   BILITOT 0.3 10/08/2011 0955       Impression:  69 year old woman with the following issues  #1. Stage II ,2.6 cm, ER/PR positive HER-2 negative cancer right breast. She has no evidence of disease at this time and she is up to date on her mammography. Her next one scheduled in the near future.   #2. Chronic, mild, stable, normochromic, anemia with hemoglobin. Her hemoglobin has fluctuated between 10-12 without any clear cut etiology. We'll continue to monitor this.  #3. 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   Follow-up will be in one year sooner if there is any issue.     Zola Button, MD 1/28/20161:52 PM

## 2014-12-22 NOTE — Telephone Encounter (Signed)
gave pt avs report with appt for jan 2017.

## 2015-01-02 ENCOUNTER — Ambulatory Visit
Admission: RE | Admit: 2015-01-02 | Discharge: 2015-01-02 | Disposition: A | Discharge status: Home / Self Care | Payer: Medicare PPO | Source: Ambulatory Visit

## 2015-01-02 DIAGNOSIS — Z1231 Encounter for screening mammogram for malignant neoplasm of breast: Secondary | ICD-10-CM

## 2015-04-07 DIAGNOSIS — H2513 Age-related nuclear cataract, bilateral: Secondary | ICD-10-CM | POA: Diagnosis not present

## 2015-04-07 DIAGNOSIS — H04123 Dry eye syndrome of bilateral lacrimal glands: Secondary | ICD-10-CM | POA: Diagnosis not present

## 2015-04-07 DIAGNOSIS — H524 Presbyopia: Secondary | ICD-10-CM | POA: Diagnosis not present

## 2015-05-10 ENCOUNTER — Encounter: Payer: Self-pay | Admitting: Internal Medicine

## 2015-05-10 DIAGNOSIS — F331 Major depressive disorder, recurrent, moderate: Secondary | ICD-10-CM | POA: Diagnosis not present

## 2015-05-12 DIAGNOSIS — F411 Generalized anxiety disorder: Secondary | ICD-10-CM | POA: Diagnosis not present

## 2015-05-12 DIAGNOSIS — F331 Major depressive disorder, recurrent, moderate: Secondary | ICD-10-CM | POA: Diagnosis not present

## 2015-06-08 ENCOUNTER — Encounter: Payer: Self-pay | Admitting: Internal Medicine

## 2015-06-08 ENCOUNTER — Ambulatory Visit (INDEPENDENT_AMBULATORY_CARE_PROVIDER_SITE_OTHER): Payer: Medicare PPO | Admitting: Internal Medicine

## 2015-06-08 VITALS — BP 130/80 | HR 82 | Ht 59.5 in | Wt 171.4 lb

## 2015-06-08 DIAGNOSIS — K311 Adult hypertrophic pyloric stenosis: Secondary | ICD-10-CM | POA: Diagnosis not present

## 2015-06-08 DIAGNOSIS — R1011 Right upper quadrant pain: Secondary | ICD-10-CM

## 2015-06-08 DIAGNOSIS — G43A0 Cyclical vomiting, not intractable: Secondary | ICD-10-CM

## 2015-06-08 DIAGNOSIS — K439 Ventral hernia without obstruction or gangrene: Secondary | ICD-10-CM

## 2015-06-08 DIAGNOSIS — R1032 Left lower quadrant pain: Secondary | ICD-10-CM

## 2015-06-08 DIAGNOSIS — Z1211 Encounter for screening for malignant neoplasm of colon: Secondary | ICD-10-CM | POA: Diagnosis not present

## 2015-06-08 DIAGNOSIS — R197 Diarrhea, unspecified: Secondary | ICD-10-CM

## 2015-06-08 DIAGNOSIS — R1115 Cyclical vomiting syndrome unrelated to migraine: Secondary | ICD-10-CM

## 2015-06-08 NOTE — Progress Notes (Signed)
Referred by: Lennie Odor, PA-C  Subjective:    Patient ID: Gloria Fields, female    DOB: June 11, 1946, 69 y.o.   MRN: [id number] Cc: RUQ pain, nausea and vomiting and diarrhea, colon cancer screening HPI The patient is a very nice elderly white woman with a history of a pyloric stenosis problem after ulcer disease, and history of incomplete colonoscopy for screening in 2010. There is also a history of irritable bowel syndrome.  She is here today with several issues. She's having some acid reflux symptoms and nausea despite Dexilant. She gets an intermittent sharp pain under her right breast, lasting for minutes. No clear trigger. She has had a recurrent ventral hernia after repair. She has intermittent spells for 5 years or so of nausea vomiting and diarrhea for 1-2 days. The last time she had one of those was several months ago. There is no clearcut trigger for these. She has been recommended to have a repeat colon screening. Barium enema and incomplete colonoscopy were negative in 2010 except for some diverticulosis and the impression was that she had a sigmoid colon that was angulated and Dr. Sharlett Iles could not pass the scope. She does have adhesions and scar tissue in that area. CT scanning and small bowel follow-through within the last few years, reviewed in the EMR have not shown any signs of obstruction.  She says she just doesn't feel right inside of her abdomen.  Wt Readings from Last 3 Encounters:  06/08/15 171 lb 6 oz (77.735 kg)  12/22/14 168 lb 3.2 oz (76.295 kg)  12/24/13 156 lb (70.761 kg)   Allergies  Allergen Reactions  . Morphine     REACTION: paranoid   Outpatient Prescriptions Prior to Visit  Medication Sig Dispense Refill  . b complex vitamins tablet Take 1 tablet by mouth daily.     Ileene Patrick Products (PERIDRIN-C) 200-50-150 MG TABS Take 1 capsule by mouth 2 (two) times daily.     . Calcium Carbonate-Vitamin D (CALTRATE 600+D) 600-400 MG-UNIT per tablet  Take 1 tablet by mouth daily.     . cholecalciferol (VITAMIN D) 1000 UNITS tablet Take 1,000 Units by mouth daily.    . clonazePAM (KLONOPIN) 1 MG tablet Take 0.5 mg by mouth 2 (two) times daily as needed. For anxiety    . dexlansoprazole (DEXILANT) 60 MG capsule Take 1 capsule (60 mg total) by mouth daily. 30 capsule 6  . Eszopiclone (ESZOPICLONE) 3 MG TABS Take 3 mg by mouth at bedtime. Take immediately before bedtime    . Multiple Vitamins-Minerals (CENTRUM SILVER PO) Take 1 capsule by mouth daily.     . promethazine (PHENERGAN) 25 MG tablet Take 25 mg by mouth 3 (three) times daily as needed.    . valsartan-hydrochlorothiazide (DIOVAN HCT) 160-25 MG per tablet Take 1 tablet by mouth every morning.     . vitamin E 100 UNIT capsule Take 100 Units by mouth daily.    Marland Kitchen amphetamine-dextroamphetamine (ADDERALL) 10 MG tablet Take 10 mg by mouth 2 (two) times daily.     . ARIPiprazole (ABILIFY) 5 MG tablet Take 5 mg by mouth every morning.  1  . hydrOXYzine (VISTARIL) 25 MG capsule Take 25 mg by mouth daily.  0   No facility-administered medications prior to visit.   Past Medical History  Diagnosis Date  . Hiatal hernia   . Esophageal reflux   . Diverticulosis of colon (without mention of hemorrhage)   . Hypertension   . Osteopenia   . ADD (  attention deficit disorder)   . Anxiety   . Depression   . Back pain   . Breast cancer 2007  . Hypercholesterolemia   . Family history of malignant neoplasm of gastrointestinal tract   . Irritable bowel syndrome   . PONV (postoperative nausea and vomiting)   . Anemia   . Anemia, normocytic normochromic 12/25/2012    Mild, chronic, Hb 11-12 grams at least since 11/2006; screening studies & endoscopies unremarkable  . 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 12/24/2013   Past Surgical History  Procedure Laterality Date  . Appendectomy    . Hernia repair    . Uterine fibroid surgery      with subsequent scar tissue removal  .  Bowel perforation  2007  . Breast lumpectomy  2007    right  . Ventral hernia repair  10/29/2012    Procedure: LAPAROSCOPIC VENTRAL HERNIA;  Surgeon: Pedro Earls, MD;  Location: [facility];  Service: General;  Laterality: N/A;  LAPRASCOPIC  ASSISTED OPEN VENTRAL HERNIA REPAIR, LAPRASCOPIC LYSIS OF ADHESIONS   . Laparoscopic lysis of adhesions  10/29/2012    Procedure: LAPAROSCOPIC LYSIS OF ADHESIONS;  Surgeon: Pedro Earls, MD;  Location: [facility];  Service: General;;  . Wisdom tooth extraction    . Esophagogastroduodenoscopy    . Colonoscopy     History   Social History  . Marital Status: Divorced    Spouse Name: N/A  . Number of Children: 0  . Years of Education: N/A   Occupational History  . Retired Pharmacist, hospital    Social History Main Topics  . Smoking status: Never Smoker   . Smokeless tobacco: Never Used  . Alcohol Use: 0.0 oz/week    0 Glasses of wine per week     Comment: rarely  . Drug Use: No  . Sexual Activity: Not on file   Other Topics Concern  . None   Social History Narrative   She is divorced. No children. She is a retired Statistician.    Lives alone.   One caffeinated beverage daily.   06/08/2015      Family History  Problem Relation Age of Onset  . Other Father     colon rupture  . Pancreatic cancer Cousin   . Diabetes Maternal Aunt   . Prostate cancer Maternal Grandfather   . Breast cancer Maternal Aunt     X 3        Review of Systems Has had to change psychiatrists - one retired then second one changed medications and had side effects now better and stable mood with current regimen. + mm cramps, + insomnia - Treated, + treated depression and anxiety, ? Back pain, all other ROS negative    Objective:   Physical Exam @BP  130/80 mmHg  Pulse 82  Ht 4' 11.5" (1.511 m)  Wt 171 lb 6 oz (77.735 kg)  BMI 34.05 kg/m2@  General:  Well-developed, well-nourished and in no acute distress - obese Eyes:  anicteric. ENT:   Mouth and posterior  pharynx free of lesions.  Neck:   supple w/o thyromegaly or mass.  Lungs: Clear to auscultation bilaterally. Heart:  S1S2, no rubs, murmurs, gallops. Abdomen:  soft, non-tender, no hepatosplenomegaly, or mass and BS+. + midline scar and softball sized epigastric abdominal wall hernia - soft and reducible Rectal: deferred Lymph:  no cervical or supraclavicular adenopathy. Extremities:   no edema, cyanosis or clubbing Skin   no rash. Neuro:  A&O x 3.  Psych:  appropriate mood and  Affect.   Data Reviewed: Prior EGD, colonoscopy, labs, xrays - CT, SBFT Anoscopy is 2010, EGD that and also EGD 2014 was some pyloric thickening and stenosis. This was evident on barium studies as well. H. pylori biopsies negative.    Assessment & Plan:  Non-intractable cyclical vomiting with nausea  Pyloric stenosis, acquired  Diarrhea  Colon cancer screening  RUQ pain  LLQ pain  Abdominal wall hernia   1. EGD to evaluate the pyloric stenosis seen in past - ? Related to sxs - may need pyloric dilation 2. Colonoscopy for screening - use ultraslim colonoscope at hospital to pass through stenotic sigmoid area 3. The risks and benefits as well as alternatives of endoscopic procedure(s) have been discussed and reviewed. All questions answered. The patient agrees to proceed. 4. F/U [facility] Dr. Hassell Done re: recurrent hernia I appreciate the opportunity to care for this patient. CC: REDMON,NOELLE, PA-C

## 2015-06-08 NOTE — Patient Instructions (Signed)
You have been scheduled for an endoscopy and colonoscopy. Please follow the written instructions given to you at your visit today. Please pick up your prep supplies at the pharmacy within the next 1-3 days. If you use inhalers (even only as needed), please bring them with you on the day of your procedure.  I appreciate the opportunity to care for you. Carl Gessner, MD, FACG 

## 2015-06-12 DIAGNOSIS — F331 Major depressive disorder, recurrent, moderate: Secondary | ICD-10-CM | POA: Diagnosis not present

## 2015-06-14 ENCOUNTER — Encounter: Payer: Self-pay | Admitting: Gastroenterology

## 2015-06-23 DIAGNOSIS — F331 Major depressive disorder, recurrent, moderate: Secondary | ICD-10-CM | POA: Diagnosis not present

## 2015-06-29 ENCOUNTER — Other Ambulatory Visit: Payer: Self-pay | Admitting: Physician Assistant

## 2015-06-29 DIAGNOSIS — R1031 Right lower quadrant pain: Secondary | ICD-10-CM

## 2015-06-29 DIAGNOSIS — R109 Unspecified abdominal pain: Secondary | ICD-10-CM | POA: Diagnosis not present

## 2015-06-29 DIAGNOSIS — R1032 Left lower quadrant pain: Secondary | ICD-10-CM

## 2015-06-29 DIAGNOSIS — R197 Diarrhea, unspecified: Secondary | ICD-10-CM | POA: Diagnosis not present

## 2015-07-03 ENCOUNTER — Ambulatory Visit
Admission: RE | Admit: 2015-07-03 | Discharge: 2015-07-03 | Disposition: A | Discharge status: Home / Self Care | Payer: Medicare PPO | Source: Ambulatory Visit | Attending: Physician Assistant | Admitting: Physician Assistant

## 2015-07-03 ENCOUNTER — Other Ambulatory Visit: Payer: Self-pay | Admitting: Physician Assistant

## 2015-07-03 DIAGNOSIS — R1011 Right upper quadrant pain: Secondary | ICD-10-CM | POA: Diagnosis not present

## 2015-07-03 DIAGNOSIS — R1032 Left lower quadrant pain: Secondary | ICD-10-CM

## 2015-07-03 DIAGNOSIS — R197 Diarrhea, unspecified: Secondary | ICD-10-CM

## 2015-07-03 DIAGNOSIS — R1900 Intra-abdominal and pelvic swelling, mass and lump, unspecified site: Secondary | ICD-10-CM

## 2015-07-03 DIAGNOSIS — N838 Other noninflammatory disorders of ovary, fallopian tube and broad ligament: Secondary | ICD-10-CM | POA: Diagnosis not present

## 2015-07-03 DIAGNOSIS — R1031 Right lower quadrant pain: Secondary | ICD-10-CM

## 2015-07-05 ENCOUNTER — Ambulatory Visit
Admission: RE | Admit: 2015-07-05 | Discharge: 2015-07-05 | Disposition: A | Discharge status: Home / Self Care | Payer: Medicare PPO | Source: Ambulatory Visit | Attending: Physician Assistant | Admitting: Physician Assistant

## 2015-07-05 DIAGNOSIS — R102 Pelvic and perineal pain: Secondary | ICD-10-CM | POA: Diagnosis not present

## 2015-07-05 DIAGNOSIS — R1900 Intra-abdominal and pelvic swelling, mass and lump, unspecified site: Secondary | ICD-10-CM

## 2015-07-05 DIAGNOSIS — N838 Other noninflammatory disorders of ovary, fallopian tube and broad ligament: Secondary | ICD-10-CM | POA: Diagnosis not present

## 2015-07-06 ENCOUNTER — Encounter (HOSPITAL_COMMUNITY): Payer: Self-pay | Admitting: *Deleted

## 2015-07-06 NOTE — Progress Notes (Signed)
Pt denies chest pain and being under the care of a cardiologist but admits to SOB with exertion. Pt made aware to stop taking Aspirin, NSAIDS, otc vitamins ( Centrum), herbal medications ( Bioflavonoid and Vitamin E). Pt verbalized understanding of all pre-op instructions.

## 2015-07-07 ENCOUNTER — Ambulatory Visit (HOSPITAL_COMMUNITY)
Admission: RE | Admit: 2015-07-07 | Discharge: 2015-07-07 | Disposition: A | Discharge status: Home / Self Care | Payer: Medicare PPO | Source: Ambulatory Visit | Attending: Internal Medicine | Admitting: Internal Medicine

## 2015-07-07 ENCOUNTER — Ambulatory Visit (HOSPITAL_COMMUNITY): Payer: Medicare PPO | Admitting: Anesthesiology

## 2015-07-07 ENCOUNTER — Encounter (HOSPITAL_COMMUNITY): Payer: Self-pay

## 2015-07-07 ENCOUNTER — Encounter (HOSPITAL_COMMUNITY)
Admission: RE | Disposition: A | Discharge status: Home / Self Care | Payer: Self-pay | Source: Ambulatory Visit | Attending: Internal Medicine

## 2015-07-07 DIAGNOSIS — Z853 Personal history of malignant neoplasm of breast: Secondary | ICD-10-CM | POA: Diagnosis not present

## 2015-07-07 DIAGNOSIS — K56699 Other intestinal obstruction unspecified as to partial versus complete obstruction: Secondary | ICD-10-CM | POA: Diagnosis present

## 2015-07-07 DIAGNOSIS — K311 Adult hypertrophic pyloric stenosis: Secondary | ICD-10-CM

## 2015-07-07 DIAGNOSIS — R197 Diarrhea, unspecified: Secondary | ICD-10-CM | POA: Insufficient documentation

## 2015-07-07 DIAGNOSIS — K5790 Diverticulosis of intestine, part unspecified, without perforation or abscess without bleeding: Secondary | ICD-10-CM | POA: Diagnosis not present

## 2015-07-07 DIAGNOSIS — R112 Nausea with vomiting, unspecified: Secondary | ICD-10-CM | POA: Diagnosis not present

## 2015-07-07 DIAGNOSIS — R1115 Cyclical vomiting syndrome unrelated to migraine: Secondary | ICD-10-CM

## 2015-07-07 DIAGNOSIS — F419 Anxiety disorder, unspecified: Secondary | ICD-10-CM | POA: Insufficient documentation

## 2015-07-07 DIAGNOSIS — K5669 Other intestinal obstruction: Secondary | ICD-10-CM

## 2015-07-07 DIAGNOSIS — N83209 Unspecified ovarian cyst, unspecified side: Secondary | ICD-10-CM | POA: Diagnosis present

## 2015-07-07 DIAGNOSIS — Z1211 Encounter for screening for malignant neoplasm of colon: Secondary | ICD-10-CM | POA: Diagnosis not present

## 2015-07-07 DIAGNOSIS — K5730 Diverticulosis of large intestine without perforation or abscess without bleeding: Secondary | ICD-10-CM | POA: Diagnosis present

## 2015-07-07 DIAGNOSIS — K611 Rectal abscess: Secondary | ICD-10-CM | POA: Diagnosis present

## 2015-07-07 HISTORY — DX: Adult hypertrophic pyloric stenosis: K31.1

## 2015-07-07 HISTORY — DX: Personal history of other endocrine, nutritional and metabolic disease: Z86.39

## 2015-07-07 HISTORY — DX: Unspecified ovarian cyst, unspecified side: N83.209

## 2015-07-07 HISTORY — PX: COLONOSCOPY WITH PROPOFOL: SHX5780

## 2015-07-07 HISTORY — DX: Headache: R51

## 2015-07-07 HISTORY — DX: Reserved for inherently not codable concepts without codable children: IMO0001

## 2015-07-07 HISTORY — DX: Headache, unspecified: R51.9

## 2015-07-07 HISTORY — PX: ESOPHAGOGASTRODUODENOSCOPY: SHX5428

## 2015-07-07 SURGERY — COLONOSCOPY WITH PROPOFOL
Anesthesia: Monitor Anesthesia Care

## 2015-07-07 MED ORDER — PROPOFOL 10 MG/ML IV BOLUS
INTRAVENOUS | Status: DC | PRN
  Administered 2015-07-07: 20 mg via INTRAVENOUS

## 2015-07-07 MED ORDER — SODIUM CHLORIDE 0.9 % IV SOLN: INTRAVENOUS | Status: DC

## 2015-07-07 MED ORDER — PROMETHAZINE HCL 25 MG/ML IJ SOLN: 6.2500 mg | INTRAMUSCULAR | Status: DC | PRN

## 2015-07-07 MED ORDER — LACTATED RINGERS IV SOLN: INTRAVENOUS | Status: DC

## 2015-07-07 MED ORDER — LACTATED RINGERS IV SOLN
INTRAVENOUS | Status: DC
Start: 2015-07-07 — End: 2015-07-07
  Administered 2015-07-07: 08:00:00 via INTRAVENOUS

## 2015-07-07 MED ORDER — PROPOFOL INFUSION 10 MG/ML OPTIME
INTRAVENOUS | Status: DC | PRN
  Administered 2015-07-07: 100 ug/kg/min via INTRAVENOUS

## 2015-07-07 MED ORDER — DIPHENOXYLATE-ATROPINE 2.5-0.025 MG PO TABS: 1.0000 | ORAL_TABLET | Freq: Four times a day (QID) | ORAL | Status: DC | PRN

## 2015-07-07 MED ORDER — MEPERIDINE HCL 100 MG/ML IJ SOLN: 6.2500 mg | INTRAMUSCULAR | Status: DC | PRN

## 2015-07-07 MED ORDER — BUTAMBEN-TETRACAINE-BENZOCAINE 2-2-14 % EX AERO
INHALATION_SPRAY | CUTANEOUS | Status: DC | PRN
  Administered 2015-07-07: 2 via TOPICAL

## 2015-07-07 NOTE — Anesthesia Postprocedure Evaluation (Signed)
  Anesthesia Post-op Note  Patient: Gloria Fields  Procedure(s) Performed: Procedure(s) with comments: COLONOSCOPY WITH PROPOFOL (N/A) - NEED ULTRA SLIM COLONOSCOPE PLEASE, possible pyloric dilation ESOPHAGOGASTRODUODENOSCOPY (EGD) (N/A) - POSSIBLE PYLORIC DILATION  Patient Location: Endoscopy Unit  Anesthesia Type:MAC  Level of Consciousness: awake, alert  and oriented  Airway and Oxygen Therapy: Patient Spontanous Breathing  Post-op Pain: none  Post-op Assessment: Post-op Vital signs reviewed and Patient's Cardiovascular Status Stable              Post-op Vital Signs: Reviewed and stable  Last Vitals:  Filed Vitals:   07/07/15 0810  BP: 131/64  Pulse: 68  Temp: 36.7 C  Resp: 16    Complications: No apparent anesthesia complications

## 2015-07-07 NOTE — Anesthesia Preprocedure Evaluation (Addendum)
Anesthesia Evaluation  Patient identified by MRN, date of birth, ID band Patient awake    Reviewed: Allergy & Precautions, NPO status , Patient's Chart, lab work & pertinent test results  History of Anesthesia Complications (+) PONV  Airway Mallampati: III       Dental  (+) Teeth Intact   Pulmonary  breath sounds clear to auscultation        Cardiovascular hypertension, Pt. on medications Rhythm:Regular Rate:Normal + Systolic murmurs    Neuro/Psych PSYCHIATRIC DISORDERS Anxiety Depression    GI/Hepatic GERD-  Medicated,  Endo/Other    Renal/GU      Musculoskeletal   Abdominal (+) + obese,   Peds  Hematology   Anesthesia Other Findings   Reproductive/Obstetrics                           EKG: normal sinus rhythm, 1st degree AV block.  Anesthesia Physical Anesthesia Plan  ASA: II  Anesthesia Plan: MAC   Post-op Pain Management:    Induction: Intravenous  Airway Management Planned: Natural Airway  Additional Equipment:   Intra-op Plan:   Post-operative Plan:   Informed Consent: I have reviewed the patients History and Physical, chart, labs and discussed the procedure including the risks, benefits and alternatives for the proposed anesthesia with the patient or authorized representative who has indicated his/her understanding and acceptance.   Dental advisory given  Plan Discussed with: CRNA  Anesthesia Plan Comments:         Anesthesia Quick Evaluation

## 2015-07-07 NOTE — Op Note (Signed)
[facility] [place], [address]   ENDOSCOPY PROCEDURE REPORT  PATIENT: Gloria Fields, Gloria Fields  MR#: [id number] BIRTHDATE: 02/17/1946 , 34  yrs. old GENDER: female ENDOSCOPIST: Gatha Mayer, MD, [facility] PROCEDURE DATE:  07/07/2015 PROCEDURE:  EGD, diagnostic ASA CLASS:     Class II INDICATIONS:  hx pyloric stenosis, vomiting. MEDICATIONS: Per Anesthesia and Monitored anesthesia care TOPICAL ANESTHETIC: none  DESCRIPTION OF PROCEDURE: After the risks benefits and alternatives of the procedure were thoroughly explained, informed consent was obtained.  The    endoscope was introduced through the mouth and advanced to the second portion of the duodenum , Without limitations.  The instrument was slowly withdrawn as the mucosa was fully examined.    Mild pyloric stenosis - scope passes. Otherwise normal EGD. Retroflexed views revealed no abnormalities.     The scope was then withdrawn from the patient and the procedure completed.  COMPLICATIONS: There were no immediate complications.  ENDOSCOPIC IMPRESSION: Mild pyloric stenosis Otherwise normal EGD  RECOMMENDATIONS: Proceed with a Colonoscopy.   eSigned:  Gatha Mayer, MD, [facility] 07/07/2015 8:30 AM    CC:The Patient, Ricka Burdock, PA-C, Christophe Louis, MD

## 2015-07-07 NOTE — Transfer of Care (Signed)
Immediate Anesthesia Transfer of Care Note  Patient: Gloria Fields  Procedure(s) Performed: Procedure(s) with comments: COLONOSCOPY WITH PROPOFOL (N/A) - NEED ULTRA SLIM COLONOSCOPE PLEASE, possible pyloric dilation ESOPHAGOGASTRODUODENOSCOPY (EGD) (N/A) - POSSIBLE PYLORIC DILATION  Patient Location: Endoscopy Unit  Anesthesia Type:MAC  Level of Consciousness: awake, alert  and oriented  Airway & Oxygen Therapy: Patient Spontanous Breathing  Post-op Assessment: Report given to RN and Post -op Vital signs reviewed and stable  Post vital signs: Reviewed and stable  Last Vitals:  Filed Vitals:   07/07/15 0636  BP: 154/71  Pulse:   Temp:   Resp:     Complications: No apparent anesthesia complications

## 2015-07-07 NOTE — Discharge Instructions (Signed)
° °  Upper endoscopy showed mild narrowing of pylorus that i do not think is causing problems. Still have the narrow area in the sigmoid colon like before.  Try Lomotil generic for the diarrhea and let's see what Drs Landry Mellow and Hassell Done have to say.  I appreciate the opportunity to care for you. Gatha Mayer, MD, FACG  YOU HAD AN ENDOSCOPIC PROCEDURE TODAY: Refer to the procedure report and other information in the discharge instructions given to you for any specific questions about what was found during the examination. If this information does not answer your questions, please call Dr. Celesta Aver office at [phone] to clarify.   YOU SHOULD EXPECT: Some feelings of bloating in the abdomen. Passage of more gas than usual. Walking can help get rid of the air that was put into your GI tract during the procedure and reduce the bloating. If you had a lower endoscopy (such as a colonoscopy or flexible sigmoidoscopy) you may notice spotting of blood in your stool or on the toilet paper. Some abdominal soreness may be present for a day or two, also.  DIET: Your first meal following the procedure should be a light meal and then it is ok to progress to your normal diet. A half-sandwich or bowl of soup is an example of a good first meal. Heavy or fried foods are harder to digest and may make you feel nauseous or bloated. Drink plenty of fluids but you should avoid alcoholic beverages for 24 hours.   ACTIVITY: Your care partner should take you home directly after the procedure. You should plan to take it easy, moving slowly for the rest of the day. You can resume normal activity the day after the procedure however YOU SHOULD NOT DRIVE, use power tools, machinery or perform tasks that involve climbing or major physical exertion for 24 hours (because of the sedation medicines used during the test).   SYMPTOMS TO REPORT IMMEDIATELY: A gastroenterologist can be reached at any hour. Please call [phone]   for any of the following symptoms:  Following lower endoscopy (colonoscopy, flexible sigmoidoscopy) Excessive amounts of blood in the stool  Significant tenderness, worsening of abdominal pains  Swelling of the abdomen that is new, acute  Fever of 100 or higher  Following upper endoscopy (EGD, EUS, ERCP, esophageal dilation) Vomiting of blood or coffee ground material  New, significant abdominal pain  New, significant chest pain or pain under the shoulder blades  Painful or persistently difficult swallowing  New shortness of breath  Black, tarry-looking or red, bloody stools  FOLLOW UP:  If any biopsies were taken you will be contacted by phone or by letter within the next 1-3 weeks. Call [phone]  if you have not heard about the biopsies in 3 weeks.  Please also call with any specific questions about appointments or follow up tests.

## 2015-07-07 NOTE — Interval H&P Note (Signed)
History and Physical Interval Note:  07/07/2015 7:33 AM  Gloria Fields  has presented today for surgery, with the diagnosis of screening colon, nausea , vomitting, pyloric stenosis  The various methods of treatment have been discussed with the patient and family. After consideration of risks, benefits and other options for treatment, the patient has consented to  Procedure(s) with comments: COLONOSCOPY WITH PROPOFOL (N/A) - NEED ULTRA SLIM COLONOSCOPE PLEASE, possible pyloric dilation ESOPHAGOGASTRODUODENOSCOPY (EGD) (N/A) - POSSIBLE PYLORIC DILATION as a surgical intervention .  The patient's history has been reviewed, patient examined, no change in status, stable for surgery.  I have reviewed the patient's chart and labs.  Questions were answered to the patient's satisfaction.     Silvano Rusk

## 2015-07-07 NOTE — Op Note (Signed)
[facility] [place], [address]   COLONOSCOPY PROCEDURE REPORT  PATIENT: Gloria Fields, Gloria Fields  MR#: [id number] BIRTHDATE: [birth year] , 48  yrs. old GENDER: female ENDOSCOPIST: Gatha Mayer, MD, [facility] PROCEDURE DATE:  07/07/2015 PROCEDURE:   Colonoscopy, screening - incomplete First Screening Colonoscopy - Avg.  risk and is 50 yrs.  old or older - No.  Prior Negative Screening - Now for repeat screening. Less than 10 yrs Prior Negative Screening - Now for repeat screening.  Other: See Comments  History of Adenoma - Now for follow-up colonoscopy & has been > or = to 3 yrs.  N/A  Polyps removed today? No Recommend repeat exam, <10 yrs? No ASA CLASS:   Class II INDICATIONS:Screening for colonic neoplasia and Colorectal Neoplasm Risk Assessment for this procedure is average risk.   Last screening incomplete colonoscopy + BE 2010 MEDICATIONS: Residual sedation present, Per Anesthesia , and Monitored anesthesia care  DESCRIPTION OF PROCEDURE:   After the risks benefits and alternatives of the procedure were thoroughly explained, informed consent was obtained.  The digital rectal exam revealed no abnormalities of the rectum.   The Pentax Ultra Slim [id number] endoscope was introduced through the anus and advanced to the sigmoid colon. No adverse events experienced.   Limited by an obstruction.   Limited by a stricture.   The quality of the prep was excellent.  The instrument was then slowly withdrawn as the colon was fully examined. Estimated blood loss is zero unless otherwise noted in this procedure report.      COLON FINDINGS: Stenotic - extrinsically compressed, distal sigmoid colon - unable to pass despite using ultraslim colonoscope, pressure and position change.  Diverticulosis.  Normal rectum. Retroflexed views revealed no abnormalities.             The scope was withdrawn and the procedure completed. COMPLICATIONS: There were no  immediate complications.  ENDOSCOPIC IMPRESSION: Stenotic - extrinsically compressed, distal sigmoid colon - unable to pass despite using ultraslim colonoscope, pressure and position change.  Diverticulosis.  Normal rectum  RECOMMENDATIONS: Await GYN and [facility] evaluations re: mesh/hernia, adhesions and cystic L ovarian mass  eSigned:  Gatha Mayer, MD, [facility] 07/07/2015 8:34 AM   cc: The Patient, Christophe Louis, MD and Ricka Burdock, MD

## 2015-07-07 NOTE — H&P (View-Only) (Signed)
Referred by: Lennie Odor, PA-C  Subjective:    Patient ID: Gloria Fields, female    DOB: 12-27-1945, 69 y.o.   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 There is also a history of irritable bowel syndrome.  She is here today with several issues. She's having some acid reflux symptoms and nausea despite Dexilant. She gets an intermittent sharp pain under her right breast, lasting for minutes. No clear trigger. She has had a recurrent ventral hernia after repair. She has intermittent spells for 5 years or so of nausea vomiting and diarrhea for 1-2 days. The last time she had one of those was several months ago. There is no clearcut trigger for these. She has been recommended to have a repeat colon screening. Barium enema and incomplete colonoscopy were negative in 2010 except for some diverticulosis and the impression was that she had a sigmoid colon that was angulated and Dr. Sharlett Iles could not pass the scope. She does have adhesions and scar tissue in that area. CT scanning and small bowel follow-through within the last few years, reviewed in the EMR have not shown any signs of obstruction.  She says she just doesn't feel right inside of her abdomen.  Wt Readings from Last 3 Encounters:  06/08/15 171 lb 6 oz (77.735 kg)  12/22/14 168 lb 3.2 oz (76.295 kg)  12/24/13 156 lb (70.761 kg)   Allergies  Allergen Reactions  . Morphine     REACTION: paranoid   Outpatient Prescriptions Prior to Visit  Medication Sig Dispense Refill  . b complex vitamins tablet Take 1 tablet by mouth daily.     Ileene Patrick Products (PERIDRIN-C) 200-50-150 MG TABS Take 1 capsule by mouth 2 (two) times daily.     . Calcium Carbonate-Vitamin D (CALTRATE 600+D) 600-400 MG-UNIT per tablet  Take 1 tablet by mouth daily.     . cholecalciferol (VITAMIN D) 1000 UNITS tablet Take 1,000 Units by mouth daily.    . clonazePAM (KLONOPIN) 1 MG tablet Take 0.5 mg by mouth 2 (two) times daily as needed. For anxiety    . dexlansoprazole (DEXILANT) 60 MG capsule Take 1 capsule (60 mg total) by mouth daily. 30 capsule 6  . Eszopiclone (ESZOPICLONE) 3 MG TABS Take 3 mg by mouth at bedtime. Take immediately before bedtime    . Multiple Vitamins-Minerals (CENTRUM SILVER PO) Take 1 capsule by mouth daily.     . promethazine (PHENERGAN) 25 MG tablet Take 25 mg by mouth 3 (three) times daily as needed.    . valsartan-hydrochlorothiazide (DIOVAN HCT) 160-25 MG per tablet Take 1 tablet by mouth every morning.     . vitamin E 100 UNIT capsule Take 100 Units by mouth daily.    Marland Kitchen amphetamine-dextroamphetamine (ADDERALL) 10 MG tablet Take 10 mg by mouth 2 (two) times daily.     . ARIPiprazole (ABILIFY) 5 MG tablet Take 5 mg by mouth every morning.  1  . hydrOXYzine (VISTARIL) 25 MG capsule Take 25 mg by mouth daily.  0   No facility-administered medications prior to visit.   Past Medical History  Diagnosis Date  . Hiatal hernia   . Esophageal reflux   . Diverticulosis of colon (without mention of hemorrhage)   . Hypertension   . Osteopenia   . ADD (  attention deficit disorder)   . Anxiety   . Depression   . Back pain   . Breast cancer 2007  . Hypercholesterolemia   . Family history of malignant neoplasm of gastrointestinal tract   . Irritable bowel syndrome   . PONV (postoperative nausea and vomiting)   . Anemia   . Anemia, normocytic normochromic 12/25/2012    Mild, chronic, Hb 11-12 grams at least since 11/2006; screening studies & endoscopies unremarkable  . 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 12/24/2013   Past Surgical History  Procedure Laterality Date  . Appendectomy    . Hernia repair    . Uterine fibroid surgery      with subsequent scar tissue removal  .  Bowel perforation  2007  . Breast lumpectomy  2007    right  . Ventral hernia repair  10/29/2012    Procedure: LAPAROSCOPIC VENTRAL HERNIA;  Surgeon: Pedro Earls, MD;  Location: [facility];  Service: General;  Laterality: N/A;  LAPRASCOPIC  ASSISTED OPEN VENTRAL HERNIA REPAIR, LAPRASCOPIC LYSIS OF ADHESIONS   . Laparoscopic lysis of adhesions  10/29/2012    Procedure: LAPAROSCOPIC LYSIS OF ADHESIONS;  Surgeon: Pedro Earls, MD;  Location: [facility];  Service: General;;  . Wisdom tooth extraction    . Esophagogastroduodenoscopy    . Colonoscopy     History   Social History  . Marital Status: Divorced    Spouse Name: N/A  . Number of Children: 0  . Years of Education: N/A   Occupational History  . Retired Pharmacist, hospital    Social History Main Topics  . Smoking status: Never Smoker   . Smokeless tobacco: Never Used  . Alcohol Use: 0.0 oz/week    0 Glasses of wine per week     Comment: rarely  . Drug Use: No  . Sexual Activity: Not on file   Other Topics Concern  . None   Social History Narrative   She is divorced. No children. She is a retired Statistician.    Lives alone.   One caffeinated beverage daily.   06/08/2015      Family History  Problem Relation Age of Onset  . Other Father     colon rupture  . Pancreatic cancer Cousin   . Diabetes Maternal Aunt   . Prostate cancer Maternal Grandfather   . Breast cancer Maternal Aunt     X 3        Review of Systems Has had to change psychiatrists - one retired then second one changed medications and had side effects now better and stable mood with current regimen. + mm cramps, + insomnia - Treated, + treated depression and anxiety, ? Back pain, all other ROS negative    Objective:   Physical Exam @BP  130/80 mmHg  Pulse 82  Ht 4' 11.5" (1.511 m)  Wt 171 lb 6 oz (77.735 kg)  BMI 34.05 kg/m2@  General:  Well-developed, well-nourished and in no acute distress - obese Eyes:  anicteric. ENT:   Mouth and posterior  pharynx free of lesions.  Neck:   supple w/o thyromegaly or mass.  Lungs: Clear to auscultation bilaterally. Heart:  S1S2, no rubs, murmurs, gallops. Abdomen:  soft, non-tender, no hepatosplenomegaly, or mass and BS+. + midline scar and softball sized epigastric abdominal wall hernia - soft and reducible Rectal: deferred Lymph:  no cervical or supraclavicular adenopathy. Extremities:   no edema, cyanosis or clubbing Skin   no rash. Neuro:  A&O x 3.  Psych:  appropriate mood and  Affect.   Data Reviewed: Prior EGD, colonoscopy, labs, xrays - CT, SBFT Anoscopy is 2010, EGD that and also EGD 2014 was some pyloric thickening and stenosis. This was evident on barium studies as well. H. pylori biopsies negative.    Assessment & Plan:  Non-intractable cyclical vomiting with nausea  Pyloric stenosis, acquired  Diarrhea  Colon cancer screening  RUQ pain  LLQ pain  Abdominal wall hernia   1. EGD to evaluate the pyloric stenosis seen in past - ? Related to sxs - may need pyloric dilation 2. Colonoscopy for screening - use ultraslim colonoscope at hospital to pass through stenotic sigmoid area 3. The risks and benefits as well as alternatives of endoscopic procedure(s) have been discussed and reviewed. All questions answered. The patient agrees to proceed. 4. F/U [facility] Dr. Hassell Done re: recurrent hernia I appreciate the opportunity to care for this patient. CC: REDMON,NOELLE, PA-C

## 2015-07-10 ENCOUNTER — Encounter (HOSPITAL_COMMUNITY): Payer: Self-pay | Admitting: Internal Medicine

## 2015-07-11 DIAGNOSIS — N949 Unspecified condition associated with female genital organs and menstrual cycle: Secondary | ICD-10-CM | POA: Diagnosis not present

## 2015-07-11 DIAGNOSIS — D3911 Neoplasm of uncertain behavior of right ovary: Secondary | ICD-10-CM | POA: Diagnosis not present

## 2015-07-11 DIAGNOSIS — R102 Pelvic and perineal pain: Secondary | ICD-10-CM | POA: Diagnosis not present

## 2015-07-11 DIAGNOSIS — Z853 Personal history of malignant neoplasm of breast: Secondary | ICD-10-CM | POA: Diagnosis not present

## 2015-07-12 DIAGNOSIS — F411 Generalized anxiety disorder: Secondary | ICD-10-CM | POA: Diagnosis not present

## 2015-07-12 DIAGNOSIS — F331 Major depressive disorder, recurrent, moderate: Secondary | ICD-10-CM | POA: Diagnosis not present

## 2015-07-14 ENCOUNTER — Other Ambulatory Visit: Payer: Self-pay | Admitting: Oncology

## 2015-07-14 ENCOUNTER — Telehealth: Payer: Self-pay | Admitting: Oncology

## 2015-07-14 DIAGNOSIS — N838 Other noninflammatory disorders of ovary, fallopian tube and broad ligament: Secondary | ICD-10-CM

## 2015-07-14 NOTE — Telephone Encounter (Signed)
Confirmed appointment for 08/24 °

## 2015-07-19 ENCOUNTER — Telehealth: Payer: Self-pay | Admitting: Oncology

## 2015-07-19 ENCOUNTER — Other Ambulatory Visit (HOSPITAL_BASED_OUTPATIENT_CLINIC_OR_DEPARTMENT_OTHER): Payer: Medicare PPO

## 2015-07-19 ENCOUNTER — Ambulatory Visit (HOSPITAL_BASED_OUTPATIENT_CLINIC_OR_DEPARTMENT_OTHER): Payer: Medicare PPO | Admitting: Oncology

## 2015-07-19 ENCOUNTER — Other Ambulatory Visit: Payer: Self-pay | Admitting: Oncology

## 2015-07-19 VITALS — BP 161/73 | HR 96 | Temp 98.3°F | Resp 18 | Ht 59.5 in | Wt 169.3 lb

## 2015-07-19 DIAGNOSIS — Z853 Personal history of malignant neoplasm of breast: Secondary | ICD-10-CM

## 2015-07-19 DIAGNOSIS — R1032 Left lower quadrant pain: Secondary | ICD-10-CM

## 2015-07-19 DIAGNOSIS — D649 Anemia, unspecified: Secondary | ICD-10-CM | POA: Diagnosis not present

## 2015-07-19 DIAGNOSIS — Z1273 Encounter for screening for malignant neoplasm of ovary: Secondary | ICD-10-CM | POA: Insufficient documentation

## 2015-07-19 DIAGNOSIS — N838 Other noninflammatory disorders of ovary, fallopian tube and broad ligament: Secondary | ICD-10-CM

## 2015-07-19 DIAGNOSIS — R938 Abnormal findings on diagnostic imaging of other specified body structures: Secondary | ICD-10-CM | POA: Diagnosis not present

## 2015-07-19 DIAGNOSIS — C50519 Malignant neoplasm of lower-outer quadrant of unspecified female breast: Secondary | ICD-10-CM

## 2015-07-19 LAB — CBC WITH DIFFERENTIAL/PLATELET
BASO%: 0.2 % (ref 0.0–2.0)
Basophils Absolute: 0 10*3/uL (ref 0.0–0.1)
EOS%: 3.5 % (ref 0.0–7.0)
Eosinophils Absolute: 0.2 10*3/uL (ref 0.0–0.5)
HCT: 35.6 % (ref 34.8–46.6)
HGB: 11.9 g/dL (ref 11.6–15.9)
LYMPH%: 30.8 % (ref 14.0–49.7)
MCH: 30.1 pg (ref 25.1–34.0)
MCHC: 33.4 g/dL (ref 31.5–36.0)
MCV: 89.9 fL (ref 79.5–101.0)
MONO#: 0.6 10*3/uL (ref 0.1–0.9)
MONO%: 8.7 % (ref 0.0–14.0)
NEUT#: 3.8 10*3/uL (ref 1.5–6.5)
NEUT%: 56.8 % (ref 38.4–76.8)
Platelets: 186 10*3/uL (ref 145–400)
RBC: 3.96 10*6/uL (ref 3.70–5.45)
RDW: 12.9 % (ref 11.2–14.5)
WBC: 6.7 10*3/uL (ref 3.9–10.3)
lymph#: 2.1 10*3/uL (ref 0.9–3.3)

## 2015-07-19 LAB — COMPREHENSIVE METABOLIC PANEL (CC13)
ALT: 16 U/L (ref 0–55)
AST: 15 U/L (ref 5–34)
Albumin: 4.1 g/dL (ref 3.5–5.0)
Alkaline Phosphatase: 84 U/L (ref 40–150)
Anion Gap: 12 mEq/L — ABNORMAL HIGH (ref 3–11)
BUN: 17 mg/dL (ref 7.0–26.0)
CO2: 25 mEq/L (ref 22–29)
Calcium: 10 mg/dL (ref 8.4–10.4)
Chloride: 102 mEq/L (ref 98–109)
Creatinine: 1 mg/dL (ref 0.6–1.1)
EGFR: 61 mL/min/{1.73_m2} — ABNORMAL LOW (ref >90)
Glucose: 106 mg/dl (ref 70–140)
Potassium: 3.6 mEq/L (ref 3.5–5.1)
Sodium: 139 mEq/L (ref 136–145)
Total Bilirubin: 0.29 mg/dL (ref 0.20–1.20)
Total Protein: 7.3 g/dL (ref 6.4–8.3)

## 2015-07-19 NOTE — Telephone Encounter (Signed)
per p[facility] tos ch pt appt-sch referral w/Dr Renford Dills pt copy of avs

## 2015-07-19 NOTE — Progress Notes (Signed)
Hematology and Oncology Follow Up Visit  Gloria Fields [id number] 14-Jul-1946 69 y.o. 07/19/2015 9:49 AM   Principle Diagnosis: 69 year old woman diagnosed with breast cancer in 2007. 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 She also has chronic mild anemia.  She was found to have a ovarian mass on the left side measuring 6 cm in August 2016.  Prior therapy: She underwent lumpectomy 11/04/2006. Estrogen receptor 99% positive, progesterone receptor 74%, Ki67 proliferation marker 6%, HER-2/neu by fish no amplification. She received breast radiation followed by 5 years of Arimidex. She has been in remission for over  years.  Interim History:   Gloria Fields presents today for a follow-up visit. Since the last visit, she developed left-sided lower quadrant abdominal pain. The pain is nagging and persistent associated with some nausea but no vomiting. She did have some episodic anorexia associated with it. She was evaluated by her primary care provider and a CT scan of the abdomen obtained on 07/03/2015. The CT scan showed 6 x 4.6 cm cystic mass in the left adnexal suspicious for a ovarian neoplasm. A transvaginal ultrasound obtained on 07/05/2015 showed 6.5 cm complex left adnexal mass suspicious for malignancy. She was evaluated by a gynecology Dr. Landry Mellow and I was asked to comment on these findings. She did have a GI workup including endoscopy and a colonoscopy that was unrevealing.  Clinically, she has improvement in her left lower quadrant pain. She has not reported any nausea, vomiting or abdominal distention. She did not have any early satiety. She denied any GI, GU or GYN bleeding.  She does have multiple aunts that had breast cancer in their 58s. No history of ovarian cancer or breast cancer before the age of 57.  She denies any headache or change in vision. No bone pain. No change in bowel  habit. She tends to have loose bowels. She has history of intermittent bowel obstruction from adhesions. She denies any vaginal bleeding or discharge. She does not report any chest pain, palpitation, orthopnea or PND. She does not report any cough or hemoptysis. Rest of review of systems unremarkable.   Medications: reviewed  Allergies:  Allergies  Allergen Reactions  . Morphine Other (See Comments)    REACTION: paranoid     Physical Exam: Blood pressure 161/73, pulse 96, temperature 98.3 F (36.8 C), temperature source Oral, resp. rate 18, height 4' 11.5" (1.511 m), weight 169 lb 4.8 oz (76.794 kg). Wt Readings from Last 3 Encounters:  07/19/15 169 lb 4.8 oz (76.794 kg)  07/07/15 163 lb (73.936 kg)  06/08/15 171 lb 6 oz (77.735 kg)     ECOG 0 General appearance: Well-nourished Caucasian woman. Not in any distress. HENNT: Pharynx no erythema. Lymph nodes: No cervical, supraclavicular, or axillary lymphadenopathy Lungs: Clear to auscultation, resonant to percussion throughout Heart: Regular rhythm, no murmur, no gallop, no rub, no click, no edema Abdomen: Soft, nontender, normal bowel sounds, no mass, no organomegaly. No shifting dullness or ascites. Extremities: No edema, no calf tenderness Musculoskeletal: no joint deformities Vascular: Carotid pulses 2+, no bruits,  Neurologic: Alert, oriented, no deficits noted. Skin: No rash or ecchymosis  Lab Results: CBC W/Diff    Component Value Date/Time   WBC 6.7 07/19/2015 0903   WBC 10.5 11/03/2012 0450   RBC 3.96 07/19/2015 0903   RBC 2.98* 11/03/2012 0450   HGB 11.9 07/19/2015 0903   HGB 8.7* 11/03/2012 0450  HCT 35.6 07/19/2015 0903   HCT 26.5* 11/03/2012 0450   PLT 186 07/19/2015 0903   PLT 239 11/03/2012 0450   MCV 89.9 07/19/2015 0903   MCV 88.9 11/03/2012 0450   MCH 30.1 07/19/2015 0903   MCH 29.2 11/03/2012 0450   MCHC 33.4 07/19/2015 0903   MCHC 32.8 11/03/2012 0450   RDW 12.9 07/19/2015 0903   RDW 13.7  11/03/2012 0450   LYMPHSABS 2.1 07/19/2015 0903   LYMPHSABS 3.0 11/03/2012 0450   MONOABS 0.6 07/19/2015 0903   MONOABS 0.5 11/03/2012 0450   EOSABS 0.2 07/19/2015 0903   EOSABS 0.7 11/03/2012 0450   BASOSABS 0.0 07/19/2015 0903   BASOSABS 0.0 11/03/2012 0450     Chemistry      Component Value Date/Time   NA 139 12/22/2014 1320   NA 138 11/03/2012 0450   K 4.3 12/22/2014 1320   K 3.6 11/03/2012 0450   CL 103 11/03/2012 0450   CO2 21* 12/22/2014 1320   CO2 25 11/03/2012 0450   BUN 35.5* 12/22/2014 1320   BUN 17 11/03/2012 0450   CREATININE 1.2* 12/22/2014 1320   CREATININE 1.10 11/03/2012 0450      Component Value Date/Time   CALCIUM 9.1 12/22/2014 1320   CALCIUM 8.9 11/03/2012 0450   ALKPHOS 82 12/22/2014 1320   ALKPHOS 73 10/08/2011 0955   AST 18 12/22/2014 1320   AST 18 10/08/2011 0955   ALT 16 12/22/2014 1320   ALT 18 10/08/2011 0955   BILITOT 0.28 12/22/2014 1320   BILITOT 0.3 10/08/2011 0955       Impression: 69 year old woman with the following issues  1. Stage II ,2.6 cm, ER/PR positive HER-2 negative cancer right breast. She has no evidence of disease at this time and she is up to date on her mammography.   2. 6.5 cm complex left adnexal mass suspicious for malignancy: The differential diagnosis was discussed with the patient today. Given her history, age as well as the appearance of these masses we have to rule out malignancy. A ovarian cancer would be high on the differential and a CA-125 has been obtained today.   The plan is to refer her to GYN oncology for an evaluation. I explained to her that she might require surgical resection and possible adjuvant chemotherapy systemically if indeed we are dealing with ovarian cancer.  3. Anemia: Her hemoglobin is excellent at this time without any evidence of worsening anemia.  4. 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 That will be taken into  account as she is undergoing evaluation for possible adnexal mass.  5. Follow-up: Will be next few weeks to follow her progress.     [facility], MD 8/24/20169:49 AM

## 2015-07-20 LAB — CA 125: CA 125: 12 U/mL (ref <35)

## 2015-07-21 DIAGNOSIS — F331 Major depressive disorder, recurrent, moderate: Secondary | ICD-10-CM | POA: Diagnosis not present

## 2015-07-28 ENCOUNTER — Ambulatory Visit: Payer: Medicare PPO | Attending: Gynecologic Oncology | Admitting: Gynecologic Oncology

## 2015-07-28 ENCOUNTER — Encounter: Payer: Self-pay | Admitting: Gynecologic Oncology

## 2015-07-28 VITALS — BP 151/61 | HR 108 | Temp 98.9°F | Resp 18 | Ht 59.5 in | Wt 170.8 lb

## 2015-07-28 DIAGNOSIS — N950 Postmenopausal bleeding: Secondary | ICD-10-CM | POA: Diagnosis not present

## 2015-07-28 DIAGNOSIS — R9389 Abnormal findings on diagnostic imaging of other specified body structures: Secondary | ICD-10-CM

## 2015-07-28 DIAGNOSIS — N949 Unspecified condition associated with female genital organs and menstrual cycle: Secondary | ICD-10-CM | POA: Insufficient documentation

## 2015-07-28 DIAGNOSIS — R934 Abnormal findings on diagnostic imaging of urinary organs: Secondary | ICD-10-CM

## 2015-07-28 DIAGNOSIS — N858 Other specified noninflammatory disorders of uterus: Secondary | ICD-10-CM | POA: Diagnosis not present

## 2015-07-28 DIAGNOSIS — N9489 Other specified conditions associated with female genital organs and menstrual cycle: Secondary | ICD-10-CM

## 2015-07-28 NOTE — Progress Notes (Signed)
Consult Note: Gyn-Onc  Consult was requested by Dr. Alen Blew for the evaluation of Gloria Fields 68 y.o. female with a left ovarian cyst  CC:  Chief Complaint  Patient presents with  . adnexal mass    New consult    Assessment/Plan:  Gloria Fields  is a 69 y.o.  year old with a left ovarian cyst which is likely benign, a thickened endometrial stripe and a recurrent ventral incisional hernia.  I performed a history, physical examination, and personally reviewed the patient's films including the CT scan from 07/03/15 and the [place] from 07/05/15.  I discussed with Gloria Fields that I believe that her ovarian cyst is most likely benign. The cyst is predominantly cystic with fine septations and no dominant solid nodularity. It is associated with a normal CA 125 tumor marker which is also reassuring. I do not believe that this cyst is causing her symptoms of abdominal pains (particularly the upper abdominal symptoms).  Because it is at low likelihood for malignancy and because it is not symptomatic, its removal is elective.  I discussed with Gloria Fields that she is at a particularly high risk for surgical complication due to her multiple prior complex surgery and known peritoneal adhesive disease. I discussed that these risks typically increase with each surgery. Therefore, she would have to feel comfortable accepting these increased risks (which include major injury to bowel, bladder, ureters, vascular structures; reoperation; wound infection or dehiscence; death) with potentially limited (if any) benefit.   She explained to me that she desires having her ventral hernia repaired at the same time as this is symptomatic. I discussed that we will facilitate her seeing Dr Hassell Done. If Dr Hassell Done feels agreeable to performing a hernia repair and lysis of adhesions, then I would perform a bilateral salpingo-oophorectomy at the same time. I would not perform hysterectomy (unless endometrial pathology is  identified on today's biopsy) because it increased the risk for infection, particularly if a mesh is placed as part of hernia repair. I discussed that the route of surgery that I would attempt would be laparoscopic with robotic assistance, however, if Dr Hassell Done feels that an open (laparotomy) approach would be best to repair this hernia, then we would defer to his preferred operative approach.  If Dr Hassell Done feels that surgery is not indicated for her surgery, then we will follow her ovarian cyst for stability with serial US examinations at 6 monthly intervals for a 12 month period.  With respect to her thickened endometrial stripe, I will followup today's biopsy. I have a low suspicion for uterine malignancy due to her lack of symptoms of bleeding. If malignancy is found, it would change our recommendations as stated above.  HPI: Gloria Fields is a 69 year old G0 who is seen in consultation at the request of Dr Alen Blew for a 6cm left ovarian cyst in the setting of a normal CA 125 and ventral hernia.  The patient was experiencing a bout of nausea and diarrhea which prompted a CT of the abdomen and pelvis being performed on 07/03/15. This revealed an approximately 6cm ovarian cyst on the left. It was followed up with a transvaginal US on 07/05/15 which also confirmed a left adnexal cyst measuring 6.5cm x 5 x3.4cm.  it contained within septa mostly, with some appearing slightly more thickened and irregular. There were no nodules or solid components to the cystic mass. The left ovary appeared separate and was normal in appearance and measured 1.3 x 1.2 x  0.81 m. The right ovary was also normal and measured 1.7 x 1.1 x 0.9 cm. The uterus was grossly normal by retroverted and measured 5.8 x 3.7 x 3.2 cm. It had a thickened endometrial stripe (for postmenopausal woman) which measured 10 mm in thickness. A CA-125 was drawn on 07/19/2015 and was normal at 12U/mL. Of note, she denies postmenopausal bleeding or abnormal  discharge. She has been menopausal since age 25.  The patient has a surgical history that is significant for a ruptured appendix operated on via a midline laparotomy remotely. She then developed a ventral hernia which required repair with mesh by her a midline incision. She also had an intervening laparotomy with myomectomy for symptomatic uterine fibroids. She subsequently developed recurrence of her ventral abdominal hernia and a proximally 4 years ago underwent a laparotomy with lysis of adhesions and repair of the hernia. The patient reports that there was a "bowel leak" secondary to the adhesive disease that was repaired at the time of her surgery with Dr. Hassell Done. The procedure was performed at [facility].  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 She had an attempted colonoscopy 2 weeks ago which was limited due to "twisted colon".  She developed recurrence of her ventral hernia in the past year. It is symptomatic, per patient, with discomfort and she desires it be repaired.   She has anxiety and depression and ADD and admits to not always taking her medications.  Her symptoms are of intermittent sharp upper abdominal pains, achy low back pains, and left abdominal fullness. She does not take pain medications for these and is unable to distinguish exacerbating or relieving activities.  Current Meds:  Outpatient Encounter Prescriptions as of 07/28/2015  Medication Sig  . amphetamine-dextroamphetamine (ADDERALL) 20 MG tablet Take 20 mg by mouth 2 (two) times daily.   Marland Kitchen b complex vitamins tablet Take 1 tablet by mouth daily.   Ileene Patrick Products (PERIDRIN-C) 200-50-150 MG TABS Take 1 capsule by mouth 2 (two) times daily.   . cholecalciferol (VITAMIN D) 1000 UNITS tablet Take 1,000 Units by mouth daily.  . clonazePAM (KLONOPIN) 1 MG tablet Take 0.5 mg by mouth 2 (two) times daily as needed. For anxiety  . dexlansoprazole (DEXILANT) 60 MG capsule Take 1 capsule (60 mg  total) by mouth daily.  . Eszopiclone (ESZOPICLONE) 3 MG TABS Take 3 mg by mouth at bedtime. Take immediately before bedtime  . Multiple Vitamins-Minerals (CENTRUM SILVER PO) Take 1 capsule by mouth daily.   . sertraline (ZOLOFT) 25 MG tablet Take 75 mg by mouth daily.   . valsartan-hydrochlorothiazide (DIOVAN HCT) 160-25 MG per tablet Take 1 tablet by mouth every morning.   . vitamin E 100 UNIT capsule Take 100 Units by mouth daily.  . diphenoxylate-atropine (LOMOTIL) 2.5-0.025 MG per tablet Take 1 tablet by mouth 4 (four) times daily as needed for diarrhea or loose stools. (Patient not taking: Reported on 07/19/2015)   No facility-administered encounter medications on file as of 07/28/2015.    Allergy:  Allergies  Allergen Reactions  . Morphine Other (See Comments)    REACTION: paranoid    Social Hx:   Social History   Social History  . Marital Status: Divorced    Spouse Name: N/A  . Number of Children: 0  . Years of Education: N/A   Occupational History  . Retired Pharmacist, hospital    Social History Main Topics  . Smoking status: Never Smoker   . Smokeless tobacco: Never Used  .  Alcohol Use: 0.0 oz/week    0 Glasses of wine per week     Comment: rarely  . Drug Use: No  . Sexual Activity: Not on file   Other Topics Concern  . Not on file   Social History Narrative   She is divorced. No children. She is a retired Statistician.    Lives alone.   One caffeinated beverage daily.   06/08/2015       Past Surgical Hx:  Past Surgical History  Procedure Laterality Date  . Appendectomy    . Hernia repair    . Uterine fibroid surgery      with subsequent scar tissue removal  . Bowel perforation  2007  . Breast lumpectomy  2007    right  . Ventral hernia repair  10/29/2012    Procedure: LAPAROSCOPIC VENTRAL HERNIA;  Surgeon: Pedro Earls, MD;  Location: [facility];  Service: General;  Laterality: N/A;  LAPRASCOPIC  ASSISTED OPEN VENTRAL HERNIA REPAIR, LAPRASCOPIC LYSIS OF  ADHESIONS   . Laparoscopic lysis of adhesions  10/29/2012    Procedure: LAPAROSCOPIC LYSIS OF ADHESIONS;  Surgeon: Pedro Earls, MD;  Location: [facility];  Service: General;;  . Wisdom tooth extraction    . Esophagogastroduodenoscopy    . Colonoscopy    . Upper gastrointestinal endoscopy  Multiple  . Colonoscopy with propofol N/A 07/07/2015    Procedure: COLONOSCOPY WITH PROPOFOL;  Surgeon: Gatha Mayer, MD;  Location: [facility];  Service: Endoscopy;  Laterality: N/A;  NEED ULTRA SLIM COLONOSCOPE PLEASE, possible pyloric dilation  . Esophagogastroduodenoscopy N/A 07/07/2015    Procedure: ESOPHAGOGASTRODUODENOSCOPY (EGD);  Surgeon: Gatha Mayer, MD;  Location: [facility];  Service: Endoscopy;  Laterality: N/A;  POSSIBLE PYLORIC DILATION    Past Medical Hx:  Past Medical History  Diagnosis Date  . Hiatal hernia   . Esophageal reflux   . Diverticulosis of colon (without mention of hemorrhage)   . Hypertension   . Osteopenia   . ADD (attention deficit disorder)   . Anxiety   . Depression   . Back pain   . Breast cancer 2007  . Hypercholesterolemia   . Family history of malignant neoplasm of gastrointestinal tract   . Irritable bowel syndrome   . Anemia   . Anemia, normocytic normochromic 12/25/2012    Mild, chronic, Hb 11-12 grams at least since 11/2006; screening studies & endoscopies unremarkable  . 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 12/24/2013  . Shortness of breath dyspnea     with exertion  . Ovarian cyst   . Headache     PMH  . H/O dehydration   . Pyloric stenosis   . PONV (postoperative nausea and vomiting)   . Ovarian cystic mass 07/07/2015    Past Gynecological History:  G0. Used fertility drugs in her 89's. LMP was age 17. No LMP recorded. Patient is postmenopausal.  Family Hx:  Family History  Problem Relation Age of Onset  . Other Father     colon rupture  . Pancreatic cancer Cousin   . Diabetes Maternal Aunt   . Prostate cancer  Maternal Grandfather   . Breast cancer Maternal Aunt     X 3    Review of Systems:  Constitutional  Feels well,    ENT Normal appearing ears and nares bilaterally Skin/Breast  No rash, sores, jaundice, itching, dryness Cardiovascular  No chest pain, shortness of breath, or edema  Pulmonary  No cough or wheeze.  Gastro Intestinal  No nausea, vomitting, or diarrhoea. No bright red blood per rectum, no abdominal pain, change in bowel movement, or constipation.  Genito Urinary  No frequency, urgency, dysuria, see HPI Musculo Skeletal  No myalgia, arthralgia, joint swelling or pain  Neurologic  No weakness, numbness, change in gait,  Psychology  No depression, anxiety, insomnia.   Vitals:  Blood pressure 151/61, pulse 108, temperature 98.9 F (37.2 C), temperature source Oral, resp. rate 18, height 4' 11.5" (1.511 m), weight 170 lb 12.8 oz (77.474 kg), SpO2 98 %.  Physical Exam: WD in NAD Neck  Supple NROM, without any enlargements.  Lymph Node Survey No cervical supraclavicular or inguinal adenopathy Cardiovascular  Pulse normal rate, regularity and rhythm. S1 and S2 normal.  Lungs  Clear to auscultation bilateraly, without wheezes/crackles/rhonchi. Good air movement.  Skin  No rash/lesions/breakdown  Psychiatry  Alert and oriented to person, place, and time  Abdomen  Normoactive bowel sounds, abdomen soft, non-tender and obese with large, soft ,reducable incisional hernia in the midline upper abdomen.  Back No CVA tenderness Genito Urinary  Vulva/vagina: Normal external female genitalia.  No lesions. No discharge or bleeding.  Bladder/urethra:  No lesions or masses, well supported bladder  Vagina: normal  Cervix: Normal appearing, no lesions.  Uterus: Small, mobile, no parametrial involvement or nodularity.  Adnexa: no palpable masses. Rectal  Good tone, no masses no cul de sac nodularity.  Extremities  No bilateral cyanosis, clubbing or edema.  PROCEDURE  NOTE: ENDOMETRIAL BIOPSY The patient was counseled regarding the procedure and provided them consent. The cervix is visualized with the speculum and grasped with a single-toothed tenaculum. The cervical os Chumley dilated with in all spine data. An endometrial Pipelle was passed through the cervical as to the uterine fundus to a depth of 7 cm. Tissue was extracted via aspiration. 2 Passes took place to retrieve a small amount of tissue. The patient tolerated the procedure well with no bleeding after removal of the tenaculum.   Donaciano Eva, MD  07/28/2015, 2:47 PM

## 2015-07-28 NOTE — Patient Instructions (Signed)
We will call you with the results from the endometrial biopsy taken today.  You will receive a phone call from [facility] with an appointment to see Dr. Hassell Done. Please call our office with any additional questions or concerns.

## 2015-08-01 ENCOUNTER — Telehealth: Payer: Self-pay | Admitting: Gynecologic Oncology

## 2015-08-01 NOTE — Telephone Encounter (Signed)
Informed her of negative endometrial biopsy results. Continued to recommend no surgical intervention (unless it is primarily indicated for her hernia repair, in which case we would plan on a combined procedure with BSO).  Donaciano Eva, MD

## 2015-08-07 ENCOUNTER — Encounter: Payer: Self-pay | Admitting: Gynecologic Oncology

## 2015-08-07 ENCOUNTER — Telehealth: Payer: Self-pay | Admitting: *Deleted

## 2015-08-07 ENCOUNTER — Telehealth: Payer: Self-pay | Admitting: Medical Oncology

## 2015-08-07 NOTE — Telephone Encounter (Signed)
Voicemail from patient asking if she really needs to keep 08-09-2015 appointment with Dr. Alen Blew.

## 2015-08-07 NOTE — Telephone Encounter (Signed)
inbasket sent to shadad's nurse.

## 2015-08-07 NOTE — Progress Notes (Signed)
Contacted pt via telephone and introduced myself. Asked pt if she had any financial questions or concerns. Pt states she doesn't at this time. Explained to patient how to reach me if needed.

## 2015-08-09 ENCOUNTER — Ambulatory Visit: Payer: Medicare PPO | Admitting: Oncology

## 2015-08-09 DIAGNOSIS — F331 Major depressive disorder, recurrent, moderate: Secondary | ICD-10-CM | POA: Diagnosis not present

## 2015-08-09 DIAGNOSIS — F411 Generalized anxiety disorder: Secondary | ICD-10-CM | POA: Diagnosis not present

## 2015-08-18 DIAGNOSIS — F331 Major depressive disorder, recurrent, moderate: Secondary | ICD-10-CM | POA: Diagnosis not present

## 2015-08-25 DIAGNOSIS — K432 Incisional hernia without obstruction or gangrene: Secondary | ICD-10-CM | POA: Diagnosis not present

## 2015-08-25 DIAGNOSIS — N8320 Unspecified ovarian cysts: Secondary | ICD-10-CM | POA: Diagnosis not present

## 2015-08-29 ENCOUNTER — Telehealth: Payer: Self-pay | Admitting: *Deleted

## 2015-08-29 NOTE — Telephone Encounter (Signed)
Called and left VM for Colletta Maryland, Dr. Earlie Server surgery scheduler, requesting return call in regards to scheduling joint case with Dr. Hassell Done and Dr. Denman George.

## 2015-09-06 DIAGNOSIS — F411 Generalized anxiety disorder: Secondary | ICD-10-CM | POA: Diagnosis not present

## 2015-09-06 DIAGNOSIS — F331 Major depressive disorder, recurrent, moderate: Secondary | ICD-10-CM | POA: Diagnosis not present

## 2015-09-15 DIAGNOSIS — F331 Major depressive disorder, recurrent, moderate: Secondary | ICD-10-CM | POA: Diagnosis not present

## 2015-10-04 DIAGNOSIS — F331 Major depressive disorder, recurrent, moderate: Secondary | ICD-10-CM | POA: Diagnosis not present

## 2015-10-04 DIAGNOSIS — F411 Generalized anxiety disorder: Secondary | ICD-10-CM | POA: Diagnosis not present

## 2015-10-09 ENCOUNTER — Encounter (HOSPITAL_COMMUNITY): Payer: Self-pay

## 2015-10-09 ENCOUNTER — Encounter (HOSPITAL_COMMUNITY)
Admission: RE | Admit: 2015-10-09 | Discharge: 2015-10-09 | Disposition: A | Discharge status: Home / Self Care | Payer: Medicare PPO | Source: Ambulatory Visit | Attending: Gynecologic Oncology | Admitting: Gynecologic Oncology

## 2015-10-09 ENCOUNTER — Encounter (INDEPENDENT_AMBULATORY_CARE_PROVIDER_SITE_OTHER): Payer: Self-pay

## 2015-10-09 DIAGNOSIS — N183 Chronic kidney disease, stage 3 (moderate): Secondary | ICD-10-CM | POA: Diagnosis not present

## 2015-10-09 DIAGNOSIS — Z803 Family history of malignant neoplasm of breast: Secondary | ICD-10-CM | POA: Diagnosis not present

## 2015-10-09 DIAGNOSIS — Z885 Allergy status to narcotic agent status: Secondary | ICD-10-CM | POA: Diagnosis not present

## 2015-10-09 DIAGNOSIS — K219 Gastro-esophageal reflux disease without esophagitis: Secondary | ICD-10-CM | POA: Diagnosis present

## 2015-10-09 DIAGNOSIS — E669 Obesity, unspecified: Secondary | ICD-10-CM | POA: Diagnosis not present

## 2015-10-09 DIAGNOSIS — N83292 Other ovarian cyst, left side: Secondary | ICD-10-CM | POA: Diagnosis not present

## 2015-10-09 DIAGNOSIS — Z01812 Encounter for preprocedural laboratory examination: Secondary | ICD-10-CM | POA: Diagnosis not present

## 2015-10-09 DIAGNOSIS — N189 Chronic kidney disease, unspecified: Secondary | ICD-10-CM | POA: Diagnosis not present

## 2015-10-09 DIAGNOSIS — N83202 Unspecified ovarian cyst, left side: Secondary | ICD-10-CM | POA: Diagnosis not present

## 2015-10-09 DIAGNOSIS — Z833 Family history of diabetes mellitus: Secondary | ICD-10-CM | POA: Diagnosis not present

## 2015-10-09 DIAGNOSIS — I129 Hypertensive chronic kidney disease with stage 1 through stage 4 chronic kidney disease, or unspecified chronic kidney disease: Secondary | ICD-10-CM | POA: Diagnosis present

## 2015-10-09 DIAGNOSIS — E7800 Pure hypercholesterolemia, unspecified: Secondary | ICD-10-CM | POA: Diagnosis present

## 2015-10-09 DIAGNOSIS — Z853 Personal history of malignant neoplasm of breast: Secondary | ICD-10-CM | POA: Diagnosis not present

## 2015-10-09 DIAGNOSIS — F339 Major depressive disorder, recurrent, unspecified: Secondary | ICD-10-CM | POA: Diagnosis not present

## 2015-10-09 DIAGNOSIS — N736 Female pelvic peritoneal adhesions (postinfective): Secondary | ICD-10-CM | POA: Diagnosis present

## 2015-10-09 DIAGNOSIS — F988 Other specified behavioral and emotional disorders with onset usually occurring in childhood and adolescence: Secondary | ICD-10-CM | POA: Diagnosis present

## 2015-10-09 DIAGNOSIS — M8580 Other specified disorders of bone density and structure, unspecified site: Secondary | ICD-10-CM | POA: Diagnosis present

## 2015-10-09 DIAGNOSIS — Z79899 Other long term (current) drug therapy: Secondary | ICD-10-CM | POA: Diagnosis not present

## 2015-10-09 DIAGNOSIS — F419 Anxiety disorder, unspecified: Secondary | ICD-10-CM | POA: Diagnosis present

## 2015-10-09 DIAGNOSIS — Z01810 Encounter for preprocedural cardiovascular examination: Secondary | ICD-10-CM | POA: Diagnosis not present

## 2015-10-09 DIAGNOSIS — K589 Irritable bowel syndrome without diarrhea: Secondary | ICD-10-CM | POA: Diagnosis present

## 2015-10-09 DIAGNOSIS — D271 Benign neoplasm of left ovary: Secondary | ICD-10-CM | POA: Diagnosis not present

## 2015-10-09 DIAGNOSIS — K432 Incisional hernia without obstruction or gangrene: Secondary | ICD-10-CM | POA: Diagnosis present

## 2015-10-09 DIAGNOSIS — Z923 Personal history of irradiation: Secondary | ICD-10-CM | POA: Diagnosis not present

## 2015-10-09 DIAGNOSIS — K660 Peritoneal adhesions (postprocedural) (postinfection): Secondary | ICD-10-CM | POA: Diagnosis present

## 2015-10-09 DIAGNOSIS — Z800 Family history of malignant neoplasm of digestive organs: Secondary | ICD-10-CM | POA: Diagnosis not present

## 2015-10-09 DIAGNOSIS — R109 Unspecified abdominal pain: Secondary | ICD-10-CM | POA: Diagnosis present

## 2015-10-09 DIAGNOSIS — Z6834 Body mass index (BMI) 34.0-34.9, adult: Secondary | ICD-10-CM | POA: Diagnosis not present

## 2015-10-09 DIAGNOSIS — Z8711 Personal history of peptic ulcer disease: Secondary | ICD-10-CM | POA: Diagnosis not present

## 2015-10-09 DIAGNOSIS — K439 Ventral hernia without obstruction or gangrene: Secondary | ICD-10-CM | POA: Diagnosis not present

## 2015-10-09 HISTORY — DX: Personal history of other medical treatment: Z92.89

## 2015-10-09 HISTORY — DX: Cardiac murmur, unspecified: R01.1

## 2015-10-09 HISTORY — DX: Repeated falls: R29.6

## 2015-10-09 HISTORY — DX: Personal history of irradiation: Z92.3

## 2015-10-09 HISTORY — DX: Chronic kidney disease, unspecified: N18.9

## 2015-10-09 LAB — URINALYSIS, ROUTINE W REFLEX MICROSCOPIC
Bilirubin Urine: NEGATIVE
Glucose, UA: NEGATIVE mg/dL
Hgb urine dipstick: NEGATIVE
Ketones, ur: NEGATIVE mg/dL
Leukocytes, UA: NEGATIVE
Nitrite: NEGATIVE
Protein, ur: NEGATIVE mg/dL
Specific Gravity, Urine: 1.019 (ref 1.005–1.030)
Urobilinogen, UA: 0.2 mg/dL (ref 0.0–1.0)
pH: 5.5 (ref 5.0–8.0)

## 2015-10-09 LAB — CBC WITH DIFFERENTIAL/PLATELET
Basophils Absolute: 0 10*3/uL (ref 0.0–0.1)
Basophils Relative: 0 %
Eosinophils Absolute: 0.2 10*3/uL (ref 0.0–0.7)
Eosinophils Relative: 2 %
HCT: 37.2 % (ref 36.0–46.0)
Hemoglobin: 12.2 g/dL (ref 12.0–15.0)
Lymphocytes Relative: 26 %
Lymphs Abs: 1.9 10*3/uL (ref 0.7–4.0)
MCH: 30.5 pg (ref 26.0–34.0)
MCHC: 32.8 g/dL (ref 30.0–36.0)
MCV: 93 fL (ref 78.0–100.0)
Monocytes Absolute: 0.4 10*3/uL (ref 0.1–1.0)
Monocytes Relative: 6 %
Neutro Abs: 4.9 10*3/uL (ref 1.7–7.7)
Neutrophils Relative %: 66 %
Platelets: 240 10*3/uL (ref 150–400)
RBC: 4 MIL/uL (ref 3.87–5.11)
RDW: 13 % (ref 11.5–15.5)
WBC: 7.4 10*3/uL (ref 4.0–10.5)

## 2015-10-09 LAB — COMPREHENSIVE METABOLIC PANEL
ALT: 19 U/L (ref 14–54)
AST: 25 U/L (ref 15–41)
Albumin: 4.6 g/dL (ref 3.5–5.0)
Alkaline Phosphatase: 75 U/L (ref 38–126)
Anion gap: 10 (ref 5–15)
BUN: 29 mg/dL — ABNORMAL HIGH (ref 6–20)
CO2: 27 mmol/L (ref 22–32)
Calcium: 10 mg/dL (ref 8.9–10.3)
Chloride: 103 mmol/L (ref 101–111)
Creatinine, Ser: 1.13 mg/dL — ABNORMAL HIGH (ref 0.44–1.00)
GFR calc Af Amer: 57 mL/min — ABNORMAL LOW (ref >60)
GFR calc non Af Amer: 49 mL/min — ABNORMAL LOW (ref >60)
Glucose, Bld: 114 mg/dL — ABNORMAL HIGH (ref 65–99)
Potassium: 4.4 mmol/L (ref 3.5–5.1)
Sodium: 140 mmol/L (ref 135–145)
Total Bilirubin: 0.5 mg/dL (ref 0.3–1.2)
Total Protein: 7.9 g/dL (ref 6.5–8.1)

## 2015-10-09 LAB — ABO/RH: ABO/RH(D): A POS

## 2015-10-09 NOTE — Patient Instructions (Signed)
KAIRAH BRINKMEYER  10/09/2015   Your procedure is scheduled on: Thursday October 12, 2015   Report to [facility]  Entrance take [place]  elevators to 3rd floor to  [place] at 7:30 AM.  Call this number if you have problems the morning of surgery [phone]   Remember: ONLY 1 PERSON MAY GO WITH YOU TO SHORT STAY TO GET  READY MORNING OF [place].  Do not eat food or drink liquids :After Midnight.     Take these medicines the morning of surgery with A SIP OF WATER: Clonazepam (Klonopin) if needed; Dexilant; May use eye drops if needed; Sertraline (Zoloft)              You may not have any metal on your body including hair pins and              piercings  Do not wear jewelry, make-up, lotions, powders or perfumes, deodorant             Do not wear nail polish.  Do not shave  48 hours prior to surgery.               Do not bring valuables to the hospital. [place].  Contacts, dentures or bridgework may not be worn into surgery.  Leave suitcase in the car. After surgery it may be brought to your room.                Please read over the following fact sheets you were given:INCENTIVE SPIROMETER; BLOOD TRANSFUSION INFORMATION SHEET _____________________________________________________________________             [facility] - Preparing for Surgery Before surgery, you can play an important role.  Because skin is not sterile, your skin needs to be as free of germs as possible.  You can reduce the number of germs on your skin by washing with CHG (chlorahexidine gluconate) soap before surgery.  CHG is an antiseptic cleaner which kills germs and bonds with the skin to continue killing germs even after washing. Please DO NOT use if you have an allergy to CHG or antibacterial soaps.  If your skin becomes reddened/irritated stop using the CHG and inform your nurse when you arrive at Short Stay. Do not  shave (including legs and underarms) for at least 48 hours prior to the first CHG shower.  You may shave your face/neck. Please follow these instructions carefully:  1.  Shower with CHG Soap the night before surgery and the  morning of Surgery.  2.  If you choose to wash your hair, wash your hair first as usual with your  normal  shampoo.  3.  After you shampoo, rinse your hair and body thoroughly to remove the  shampoo.                           4.  Use CHG as you would any other liquid soap.  You can apply chg directly  to the skin and wash                       Gently with a scrungie or clean washcloth.  5.  Apply the CHG Soap to your body ONLY FROM THE  NECK DOWN.   Do not use on face/ open                           Wound or open sores. Avoid contact with eyes, ears mouth and genitals (private parts).                       Wash face,  Genitals (private parts) with your normal soap.             6.  Wash thoroughly, paying special attention to the area where your surgery  will be performed.  7.  Thoroughly rinse your body with warm water from the neck down.  8.  DO NOT shower/wash with your normal soap after using and rinsing off  the CHG Soap.                9.  Pat yourself dry with a clean towel.            10.  Wear clean pajamas.            11.  Place clean sheets on your bed the night of your first shower and do not  sleep with pets. Day of Surgery : Do not apply any lotions/deodorants the morning of surgery.  Please wear clean clothes to the hospital/surgery center.  FAILURE TO FOLLOW THESE INSTRUCTIONS MAY RESULT IN THE CANCELLATION OF YOUR SURGERY PATIENT SIGNATURE_________________________________  NURSE SIGNATURE__________________________________  ________________________________________________________________________   Adam Phenix  An incentive spirometer is a tool that can help keep your lungs clear and active. This tool measures how well you are filling your lungs  with each breath. Taking long deep breaths may help reverse or decrease the chance of developing breathing (pulmonary) problems (especially infection) following:  A long period of time when you are unable to move or be active. BEFORE THE PROCEDURE   If the spirometer includes an indicator to show your best effort, your nurse or respiratory therapist will set it to a desired goal.  If possible, sit up straight or lean slightly forward. Try not to slouch.  Hold the incentive spirometer in an upright position. INSTRUCTIONS FOR USE   Sit on the edge of your bed if possible, or sit up as far as you can in bed or on a chair.  Hold the incentive spirometer in an upright position.  Breathe out normally.  Place the mouthpiece in your mouth and seal your lips tightly around it.  Breathe in slowly and as deeply as possible, raising the piston or the ball toward the top of the column.  Hold your breath for 3-5 seconds or for as long as possible. Allow the piston or ball to fall to the bottom of the column.  Remove the mouthpiece from your mouth and breathe out normally.  Rest for a few seconds and repeat Steps 1 through 7 at least 10 times every 1-2 hours when you are awake. Take your time and take a few normal breaths between deep breaths.  The spirometer may include an indicator to show your best effort. Use the indicator as a goal to work toward during each repetition.  After each set of 10 deep breaths, practice coughing to be sure your lungs are clear. If you have an incision (the cut made at the time of surgery), support your incision when coughing by placing a pillow or rolled up towels firmly against it. Once you are able  to get out of bed, walk around indoors and cough well. You may stop using the incentive spirometer when instructed by your caregiver.  RISKS AND COMPLICATIONS  Take your time so you do not get dizzy or light-headed.  If you are in pain, you may need to take or ask  for pain medication before doing incentive spirometry. It is harder to take a deep breath if you are having pain. AFTER USE  Rest and breathe slowly and easily.  It can be helpful to keep track of a log of your progress. Your caregiver can provide you with a simple table to help with this. If you are using the spirometer at home, follow these instructions: [place] IF:   You are having difficultly using the spirometer.  You have trouble using the spirometer as often as instructed.  Your pain medication is not giving enough relief while using the spirometer.  You develop fever of 100.5 F (38.1 C) or higher. SEEK IMMEDIATE MEDICAL CARE IF:   You cough up bloody sputum that had not been present before.  You develop fever of 102 F (38.9 C) or greater.  You develop worsening pain at or near the incision site. MAKE SURE YOU:   Understand these instructions.  Will watch your condition.  Will get help right away if you are not doing well or get worse. Document Released: 03/24/2007 Document Revised: 02/03/2012 Document Reviewed: 05/25/2007 Exit[employer] Patient Information 2014 Exit[employer], [place].   ________________________________________________________________________  WHAT IS A BLOOD TRANSFUSION? Blood Transfusion Information  A transfusion is the replacement of blood or some of its parts. Blood is made up of multiple cells which provide different functions.  Red blood cells carry oxygen and are used for blood loss replacement.  White blood cells fight against infection.  Platelets control bleeding.  Plasma helps clot blood.  Other blood products are available for specialized needs, such as hemophilia or other clotting disorders. BEFORE THE TRANSFUSION  Who gives blood for transfusions?   Healthy volunteers who are fully evaluated to make sure their blood is safe. This is blood bank blood. Transfusion therapy is the safest it has ever been in the practice of  medicine. Before blood is taken from a donor, a complete history is taken to make sure that person has no history of diseases nor engages in risky social behavior (examples are intravenous drug use or sexual activity with multiple partners). The donor's travel history is screened to minimize risk of transmitting infections, such as malaria. The donated blood is tested for signs of infectious diseases, such as HIV and hepatitis. The blood is then tested to be sure it is compatible with you in order to minimize the chance of a transfusion reaction. If you or a relative donates blood, this is often done in anticipation of surgery and is not appropriate for emergency situations. It takes many days to process the donated blood. RISKS AND COMPLICATIONS Although transfusion therapy is very safe and saves many lives, the main dangers of transfusion include:   Getting an infectious disease.  Developing a transfusion reaction. This is an allergic reaction to something in the blood you were given. Every precaution is taken to prevent this. The decision to have a blood transfusion has been considered carefully by your caregiver before blood is given. Blood is not given unless the benefits outweigh the risks. AFTER THE TRANSFUSION  Right after receiving a blood transfusion, you will usually feel much better and more energetic. This is especially true  if your red blood cells have gotten low (anemic). The transfusion raises the level of the red blood cells which carry oxygen, and this usually causes an energy increase.  The nurse administering the transfusion will monitor you carefully for complications. HOME CARE INSTRUCTIONS  No special instructions are needed after a transfusion. You may find your energy is better. Speak with your caregiver about any limitations on activity for underlying diseases you may have. SEEK MEDICAL CARE IF:   Your condition is not improving after your transfusion.  You develop  redness or irritation at the intravenous (IV) site. SEEK IMMEDIATE MEDICAL CARE IF:  Any of the following symptoms occur over the next 12 hours:  Shaking chills.  You have a temperature by mouth above 102 F (38.9 C), not controlled by medicine.  Chest, back, or muscle pain.  People around you feel you are not acting correctly or are confused.  Shortness of breath or difficulty breathing.  Dizziness and fainting.  You get a rash or develop hives.  You have a decrease in urine output.  Your urine turns a dark color or changes to pink, red, or brown. Any of the following symptoms occur over the next 10 days:  You have a temperature by mouth above 102 F (38.9 C), not controlled by medicine.  Shortness of breath.  Weakness after normal activity.  The white part of the eye turns yellow (jaundice).  You have a decrease in the amount of urine or are urinating less often.  Your urine turns a dark color or changes to pink, red, or brown. Document Released: 11/08/2000 Document Revised: 02/03/2012 Document Reviewed: 06/27/2008 [facility] Patient Information 2014 [place], [place].  _______________________________________________________________________

## 2015-10-09 NOTE — Progress Notes (Signed)
No orders in for hernia surgery. Thanks.

## 2015-10-09 NOTE — Progress Notes (Signed)
10-09-15 1545 Note CMP-Bun result, note per Epic fax.

## 2015-10-09 NOTE — Progress Notes (Signed)
CT abd/pelvis w/o contrast 07/03/2015/epic lung bases mentioned

## 2015-10-10 NOTE — Progress Notes (Signed)
Pt aware of surgical time change. Pt aware to arrive at Short Stay at 9:30 am.

## 2015-10-11 NOTE — Progress Notes (Signed)
Instructed patient to arrive to Short Stay at 0830 on 10/12/15. NPO after midnight except sips of water to take AM meds instructed by PST. Verbalizes understanding.

## 2015-10-12 ENCOUNTER — Encounter (HOSPITAL_COMMUNITY)
Admission: AD | Disposition: A | Discharge status: Home / Self Care | Payer: Self-pay | Source: Ambulatory Visit | Attending: Surgery

## 2015-10-12 ENCOUNTER — Ambulatory Visit (HOSPITAL_COMMUNITY): Payer: Medicare PPO | Admitting: Anesthesiology

## 2015-10-12 ENCOUNTER — Inpatient Hospital Stay (HOSPITAL_COMMUNITY)
Admission: AD | Admit: 2015-10-12 | Discharge: 2015-10-15 | DRG: 742 | Disposition: A | Discharge status: Home / Self Care | Payer: Medicare PPO | Source: Ambulatory Visit | Attending: Surgery | Admitting: Surgery

## 2015-10-12 ENCOUNTER — Encounter (HOSPITAL_COMMUNITY): Payer: Self-pay | Admitting: *Deleted

## 2015-10-12 DIAGNOSIS — N183 Chronic kidney disease, stage 3 (moderate): Secondary | ICD-10-CM | POA: Diagnosis present

## 2015-10-12 DIAGNOSIS — N83292 Other ovarian cyst, left side: Secondary | ICD-10-CM | POA: Diagnosis not present

## 2015-10-12 DIAGNOSIS — Z800 Family history of malignant neoplasm of digestive organs: Secondary | ICD-10-CM | POA: Diagnosis not present

## 2015-10-12 DIAGNOSIS — Z833 Family history of diabetes mellitus: Secondary | ICD-10-CM

## 2015-10-12 DIAGNOSIS — K439 Ventral hernia without obstruction or gangrene: Secondary | ICD-10-CM | POA: Diagnosis present

## 2015-10-12 DIAGNOSIS — Z01810 Encounter for preprocedural cardiovascular examination: Secondary | ICD-10-CM

## 2015-10-12 DIAGNOSIS — Z01812 Encounter for preprocedural laboratory examination: Secondary | ICD-10-CM | POA: Diagnosis not present

## 2015-10-12 DIAGNOSIS — E669 Obesity, unspecified: Secondary | ICD-10-CM | POA: Diagnosis present

## 2015-10-12 DIAGNOSIS — R109 Unspecified abdominal pain: Secondary | ICD-10-CM | POA: Diagnosis present

## 2015-10-12 DIAGNOSIS — Z885 Allergy status to narcotic agent status: Secondary | ICD-10-CM | POA: Diagnosis not present

## 2015-10-12 DIAGNOSIS — K219 Gastro-esophageal reflux disease without esophagitis: Secondary | ICD-10-CM | POA: Diagnosis present

## 2015-10-12 DIAGNOSIS — N83209 Unspecified ovarian cyst, unspecified side: Secondary | ICD-10-CM | POA: Diagnosis present

## 2015-10-12 DIAGNOSIS — Z79899 Other long term (current) drug therapy: Secondary | ICD-10-CM | POA: Diagnosis not present

## 2015-10-12 DIAGNOSIS — Z923 Personal history of irradiation: Secondary | ICD-10-CM | POA: Diagnosis not present

## 2015-10-12 DIAGNOSIS — K660 Peritoneal adhesions (postprocedural) (postinfection): Secondary | ICD-10-CM | POA: Diagnosis present

## 2015-10-12 DIAGNOSIS — Z803 Family history of malignant neoplasm of breast: Secondary | ICD-10-CM | POA: Diagnosis not present

## 2015-10-12 DIAGNOSIS — Z853 Personal history of malignant neoplasm of breast: Secondary | ICD-10-CM | POA: Diagnosis not present

## 2015-10-12 DIAGNOSIS — Z6834 Body mass index (BMI) 34.0-34.9, adult: Secondary | ICD-10-CM

## 2015-10-12 DIAGNOSIS — N83202 Unspecified ovarian cyst, left side: Principal | ICD-10-CM | POA: Diagnosis present

## 2015-10-12 DIAGNOSIS — F419 Anxiety disorder, unspecified: Secondary | ICD-10-CM | POA: Diagnosis present

## 2015-10-12 DIAGNOSIS — I129 Hypertensive chronic kidney disease with stage 1 through stage 4 chronic kidney disease, or unspecified chronic kidney disease: Secondary | ICD-10-CM | POA: Diagnosis not present

## 2015-10-12 DIAGNOSIS — E7800 Pure hypercholesterolemia, unspecified: Secondary | ICD-10-CM | POA: Diagnosis present

## 2015-10-12 DIAGNOSIS — F339 Major depressive disorder, recurrent, unspecified: Secondary | ICD-10-CM | POA: Diagnosis present

## 2015-10-12 DIAGNOSIS — N736 Female pelvic peritoneal adhesions (postinfective): Secondary | ICD-10-CM | POA: Diagnosis present

## 2015-10-12 DIAGNOSIS — K589 Irritable bowel syndrome without diarrhea: Secondary | ICD-10-CM | POA: Diagnosis present

## 2015-10-12 DIAGNOSIS — M8580 Other specified disorders of bone density and structure, unspecified site: Secondary | ICD-10-CM | POA: Diagnosis present

## 2015-10-12 DIAGNOSIS — Z8711 Personal history of peptic ulcer disease: Secondary | ICD-10-CM | POA: Diagnosis not present

## 2015-10-12 DIAGNOSIS — F988 Other specified behavioral and emotional disorders with onset usually occurring in childhood and adolescence: Secondary | ICD-10-CM | POA: Diagnosis present

## 2015-10-12 DIAGNOSIS — N189 Chronic kidney disease, unspecified: Secondary | ICD-10-CM | POA: Diagnosis not present

## 2015-10-12 DIAGNOSIS — K432 Incisional hernia without obstruction or gangrene: Secondary | ICD-10-CM | POA: Diagnosis present

## 2015-10-12 DIAGNOSIS — D271 Benign neoplasm of left ovary: Secondary | ICD-10-CM | POA: Diagnosis not present

## 2015-10-12 HISTORY — PX: LAPAROSCOPIC BILATERAL SALPINGO OOPHERECTOMY: SHX5890

## 2015-10-12 HISTORY — PX: VENTRAL HERNIA REPAIR: SHX424

## 2015-10-12 LAB — CBC
HCT: 31.7 % — ABNORMAL LOW (ref 36.0–46.0)
Hemoglobin: 10.5 g/dL — ABNORMAL LOW (ref 12.0–15.0)
MCH: 30.1 pg (ref 26.0–34.0)
MCHC: 33.1 g/dL (ref 30.0–36.0)
MCV: 90.8 fL (ref 78.0–100.0)
Platelets: 196 10*3/uL (ref 150–400)
RBC: 3.49 MIL/uL — ABNORMAL LOW (ref 3.87–5.11)
RDW: 12.9 % (ref 11.5–15.5)
WBC: 10.3 10*3/uL (ref 4.0–10.5)

## 2015-10-12 LAB — TYPE AND SCREEN
ABO/RH(D): A POS
Antibody Screen: NEGATIVE

## 2015-10-12 LAB — CREATININE, SERUM
Creatinine, Ser: 0.96 mg/dL (ref 0.44–1.00)
GFR calc Af Amer: >60 mL/min (ref >60)
GFR calc non Af Amer: 59 mL/min — ABNORMAL LOW (ref >60)

## 2015-10-12 SURGERY — SALPINGO-OOPHORECTOMY, BILATERAL, LAPAROSCOPIC
Anesthesia: General

## 2015-10-12 MED ORDER — PANTOPRAZOLE SODIUM 40 MG IV SOLR
40.0000 mg | Freq: Every day | INTRAVENOUS | Status: DC
  Administered 2015-10-12 – 2015-10-14 (×3): 40 mg via INTRAVENOUS
  Filled 2015-10-12 (×4): qty 40

## 2015-10-12 MED ORDER — CETYLPYRIDINIUM CHLORIDE 0.05 % MT LIQD
7.0000 mL | Freq: Two times a day (BID) | OROMUCOSAL | Status: DC
  Administered 2015-10-12 – 2015-10-14 (×4): 7 mL via OROMUCOSAL

## 2015-10-12 MED ORDER — SODIUM CHLORIDE 0.9 % IJ SOLN
INTRAMUSCULAR | Status: AC
  Filled 2015-10-12: qty 10

## 2015-10-12 MED ORDER — HYDRALAZINE HCL 20 MG/ML IJ SOLN: 10.0000 mg | INTRAMUSCULAR | Status: DC | PRN

## 2015-10-12 MED ORDER — SODIUM CHLORIDE 0.9 % IJ SOLN
INTRAMUSCULAR | Status: AC
  Filled 2015-10-12: qty 20

## 2015-10-12 MED ORDER — SODIUM CHLORIDE 0.9 % IJ SOLN
INTRAMUSCULAR | Status: AC
Start: 2015-10-12 — End: 2015-10-12
  Filled 2015-10-12: qty 10

## 2015-10-12 MED ORDER — LACTATED RINGERS IR SOLN
Status: DC | PRN
  Administered 2015-10-12: 1

## 2015-10-12 MED ORDER — MIDAZOLAM HCL 2 MG/2ML IJ SOLN
INTRAMUSCULAR | Status: AC
  Filled 2015-10-12: qty 2

## 2015-10-12 MED ORDER — SUFENTANIL CITRATE 50 MCG/ML IV SOLN
INTRAVENOUS | Status: AC
  Filled 2015-10-12: qty 1

## 2015-10-12 MED ORDER — SUGAMMADEX SODIUM 200 MG/2ML IV SOLN
INTRAVENOUS | Status: DC | PRN
  Administered 2015-10-12: 150 mg via INTRAVENOUS

## 2015-10-12 MED ORDER — MIDAZOLAM HCL 2 MG/2ML IJ SOLN
INTRAMUSCULAR | Status: DC | PRN
  Administered 2015-10-12: 2 mg via INTRAVENOUS

## 2015-10-12 MED ORDER — LACTATED RINGERS IV SOLN
INTRAVENOUS | Status: DC
  Administered 2015-10-12: 14:00:00 via INTRAVENOUS
  Administered 2015-10-12: 1000 mL via INTRAVENOUS
  Administered 2015-10-12: 12:00:00 via INTRAVENOUS

## 2015-10-12 MED ORDER — PROPOFOL 10 MG/ML IV BOLUS
INTRAVENOUS | Status: DC | PRN
  Administered 2015-10-12: 160 mg via INTRAVENOUS

## 2015-10-12 MED ORDER — ONDANSETRON 4 MG PO TBDP: 4.0000 mg | ORAL_TABLET | Freq: Four times a day (QID) | ORAL | Status: DC | PRN

## 2015-10-12 MED ORDER — LIDOCAINE HCL (CARDIAC) 20 MG/ML IV SOLN
INTRAVENOUS | Status: AC
  Filled 2015-10-12: qty 5

## 2015-10-12 MED ORDER — DEXAMETHASONE SODIUM PHOSPHATE 10 MG/ML IJ SOLN
INTRAMUSCULAR | Status: AC
  Filled 2015-10-12: qty 1

## 2015-10-12 MED ORDER — ONDANSETRON HCL 4 MG/2ML IJ SOLN
INTRAMUSCULAR | Status: DC | PRN
  Administered 2015-10-12: 4 mg via INTRAVENOUS

## 2015-10-12 MED ORDER — ONDANSETRON HCL 4 MG/2ML IJ SOLN
INTRAMUSCULAR | Status: AC
  Filled 2015-10-12: qty 2

## 2015-10-12 MED ORDER — FENTANYL CITRATE (PF) 100 MCG/2ML IJ SOLN
12.5000 ug | INTRAMUSCULAR | Status: DC | PRN
  Administered 2015-10-12 – 2015-10-14 (×10): 12.5 ug via INTRAVENOUS
  Filled 2015-10-12 (×10): qty 2

## 2015-10-12 MED ORDER — HYDROMORPHONE HCL 2 MG/ML IJ SOLN
INTRAMUSCULAR | Status: AC
  Filled 2015-10-12: qty 1

## 2015-10-12 MED ORDER — HEPARIN SODIUM (PORCINE) 5000 UNIT/ML IJ SOLN
5000.0000 [IU] | Freq: Three times a day (TID) | INTRAMUSCULAR | Status: DC
  Administered 2015-10-13 – 2015-10-15 (×7): 5000 [IU] via SUBCUTANEOUS
  Filled 2015-10-12 (×10): qty 1

## 2015-10-12 MED ORDER — HYDROMORPHONE HCL 1 MG/ML IJ SOLN
INTRAMUSCULAR | Status: DC | PRN
  Administered 2015-10-12 (×5): .4 mg via INTRAVENOUS

## 2015-10-12 MED ORDER — SUCCINYLCHOLINE CHLORIDE 20 MG/ML IJ SOLN
INTRAMUSCULAR | Status: DC | PRN
  Administered 2015-10-12: 100 mg via INTRAVENOUS

## 2015-10-12 MED ORDER — SUFENTANIL CITRATE 50 MCG/ML IV SOLN
INTRAVENOUS | Status: DC | PRN
  Administered 2015-10-12: 20 ug via INTRAVENOUS
  Administered 2015-10-12 (×6): 10 ug via INTRAVENOUS

## 2015-10-12 MED ORDER — BUPIVACAINE LIPOSOME 1.3 % IJ SUSP
20.0000 mL | Freq: Once | INTRAMUSCULAR | Status: AC
  Administered 2015-10-12: 20 mL
  Filled 2015-10-12: qty 20

## 2015-10-12 MED ORDER — SODIUM CHLORIDE 0.9 % IJ SOLN
INTRAMUSCULAR | Status: DC | PRN
  Administered 2015-10-12: 10 mL

## 2015-10-12 MED ORDER — ONDANSETRON HCL 4 MG/2ML IJ SOLN
4.0000 mg | Freq: Four times a day (QID) | INTRAMUSCULAR | Status: DC | PRN
  Administered 2015-10-12: 4 mg via INTRAVENOUS
  Filled 2015-10-12: qty 2

## 2015-10-12 MED ORDER — PROMETHAZINE HCL 25 MG/ML IJ SOLN: 6.2500 mg | INTRAMUSCULAR | Status: DC | PRN

## 2015-10-12 MED ORDER — PROPOFOL 10 MG/ML IV BOLUS
INTRAVENOUS | Status: AC
  Filled 2015-10-12: qty 20

## 2015-10-12 MED ORDER — ROCURONIUM BROMIDE 100 MG/10ML IV SOLN
INTRAVENOUS | Status: DC | PRN
  Administered 2015-10-12 (×2): 20 mg via INTRAVENOUS
  Administered 2015-10-12 (×3): 10 mg via INTRAVENOUS
  Administered 2015-10-12: 30 mg via INTRAVENOUS

## 2015-10-12 MED ORDER — CEFAZOLIN SODIUM-DEXTROSE 2-3 GM-% IV SOLR
2.0000 g | Freq: Once | INTRAVENOUS | Status: AC
Start: 2015-10-12 — End: 2015-10-12
  Administered 2015-10-12: 2 g via INTRAVENOUS
  Filled 2015-10-12: qty 50

## 2015-10-12 MED ORDER — 0.9 % SODIUM CHLORIDE (POUR BTL) OPTIME
TOPICAL | Status: DC | PRN
  Administered 2015-10-12: 1000 mL

## 2015-10-12 MED ORDER — SUGAMMADEX SODIUM 500 MG/5ML IV SOLN
INTRAVENOUS | Status: AC
  Filled 2015-10-12: qty 5

## 2015-10-12 MED ORDER — KCL IN DEXTROSE-NACL 20-5-0.45 MEQ/L-%-% IV SOLN
INTRAVENOUS | Status: DC
  Administered 2015-10-12: 18:00:00 via INTRAVENOUS
  Administered 2015-10-13: 100 mL/h via INTRAVENOUS
  Administered 2015-10-13 – 2015-10-14 (×2): via INTRAVENOUS
  Filled 2015-10-12 (×6): qty 1000

## 2015-10-12 MED ORDER — FENTANYL CITRATE (PF) 100 MCG/2ML IJ SOLN
INTRAMUSCULAR | Status: AC
  Filled 2015-10-12: qty 2

## 2015-10-12 MED ORDER — DEXAMETHASONE SODIUM PHOSPHATE 10 MG/ML IJ SOLN
INTRAMUSCULAR | Status: DC | PRN
  Administered 2015-10-12: 10 mg via INTRAVENOUS

## 2015-10-12 MED ORDER — CEFAZOLIN SODIUM-DEXTROSE 2-3 GM-% IV SOLR
INTRAVENOUS | Status: AC
  Filled 2015-10-12: qty 50

## 2015-10-12 MED ORDER — LIDOCAINE HCL (CARDIAC) 20 MG/ML IV SOLN
INTRAVENOUS | Status: DC | PRN
  Administered 2015-10-12: 100 mg via INTRAVENOUS

## 2015-10-12 MED ORDER — HEMOSTATIC AGENTS (NO CHARGE) OPTIME
TOPICAL | Status: DC | PRN
  Administered 2015-10-12: 1 via TOPICAL

## 2015-10-12 MED ORDER — FENTANYL CITRATE (PF) 100 MCG/2ML IJ SOLN
25.0000 ug | INTRAMUSCULAR | Status: DC | PRN
  Administered 2015-10-12: 25 ug via INTRAVENOUS

## 2015-10-12 SURGICAL SUPPLY — 98 items
APL ESCP 34 STRL LF DISP (HEMOSTASIS) ×2
APL SKNCLS STERI-STRIP NONHPOA (GAUZE/BANDAGES/DRESSINGS)
APPLICATOR SURGIFLO ENDO (HEMOSTASIS) ×1 IMPLANT
BAG SPEC RTRVL LRG 6X4 10 (ENDOMECHANICALS) ×2
BANDAGE ADH SHEER 1  50/CT (GAUZE/BANDAGES/DRESSINGS) ×3 IMPLANT
BENZOIN TINCTURE PRP APPL 2/3 (GAUZE/BANDAGES/DRESSINGS) IMPLANT
BINDER ABDOMINAL 12 ML 46-62 (SOFTGOODS) ×1 IMPLANT
CABLE HIGH FREQUENCY MONO STRZ (ELECTRODE) ×1 IMPLANT
CELLS DAT CNTRL 66122 CELL SVR (MISCELLANEOUS) ×2 IMPLANT
CHLORAPREP W/TINT 26ML (MISCELLANEOUS) ×3 IMPLANT
CONT SPEC 4OZ CLIKSEAL STRL BL (MISCELLANEOUS) IMPLANT
COVER SURGICAL LIGHT HANDLE (MISCELLANEOUS) ×3 IMPLANT
COVER TIP SHEARS 8 DVNC (MISCELLANEOUS) IMPLANT
COVER TIP SHEARS 8MM DA VINCI (MISCELLANEOUS) ×1
DECANTER SPIKE VIAL GLASS SM (MISCELLANEOUS) ×3 IMPLANT
DEVICE SECURE STRAP 25 ABSORB (INSTRUMENTS) IMPLANT
DEVICE TROCAR PUNCTURE CLOSURE (ENDOMECHANICALS) ×3 IMPLANT
DISSECTOR BLUNT TIP ENDO 5MM (MISCELLANEOUS) IMPLANT
DRAIN CHANNEL 19F RND (DRAIN) IMPLANT
DRAPE ARM DVNC X/XI (DISPOSABLE) IMPLANT
DRAPE COLUMN DVNC XI (DISPOSABLE) IMPLANT
DRAPE DA VINCI XI ARM (DISPOSABLE) ×4
DRAPE DA VINCI XI COLUMN (DISPOSABLE) ×1
DRAPE LAPAROSCOPIC ABDOMINAL (DRAPES) ×3 IMPLANT
DRAPE UTILITY XL STRL (DRAPES) ×1 IMPLANT
DRSG TELFA 3X8 NADH (GAUZE/BANDAGES/DRESSINGS) ×3 IMPLANT
ELECT CAUTERY BLADE 6.4 (BLADE) ×3 IMPLANT
ELECT PENCIL ROCKER SW 15FT (MISCELLANEOUS) ×6 IMPLANT
ELECT REM PT RETURN 9FT ADLT (ELECTROSURGICAL) ×6
ELECTRODE REM PT RTRN 9FT ADLT (ELECTROSURGICAL) ×4 IMPLANT
EVACUATOR SILICONE 100CC (DRAIN) IMPLANT
GLOVE BIO SURGEON STRL SZ 6 (GLOVE) ×6 IMPLANT
GLOVE BIO SURGEON STRL SZ 6.5 (GLOVE) ×3 IMPLANT
GLOVE BIOGEL M 8.0 STRL (GLOVE) ×3 IMPLANT
GOWN STRL NON-REIN LRG LVL3 (GOWN DISPOSABLE) ×6 IMPLANT
GOWN STRL REUS W/ TWL LRG LVL3 (GOWN DISPOSABLE) ×4 IMPLANT
GOWN STRL REUS W/TWL LRG LVL3 (GOWN DISPOSABLE) ×6
GOWN STRL REUS W/TWL XL LVL3 (GOWN DISPOSABLE) ×9 IMPLANT
HEMOSTAT SURGICEL 4X8 (HEMOSTASIS) ×3 IMPLANT
HOLDER FOLEY CATH W/STRAP (MISCELLANEOUS) IMPLANT
KIT BASIN OR (CUSTOM PROCEDURE TRAY) ×6 IMPLANT
LIQUID BAND (GAUZE/BANDAGES/DRESSINGS) ×5 IMPLANT
MANIPULATOR UTERINE 4.5 ZUMI (MISCELLANEOUS) ×1 IMPLANT
NDL SPNL 22GX3.5 QUINCKE BK (NEEDLE) ×2 IMPLANT
NEEDLE SPNL 22GX3.5 QUINCKE BK (NEEDLE) ×3 IMPLANT
PAD DRESSING TELFA 3X8 NADH (GAUZE/BANDAGES/DRESSINGS) IMPLANT
PEN SKIN MARKING BROAD (MISCELLANEOUS) ×3 IMPLANT
PORT ACCESS TROCAR AIRSEAL 12 (TROCAR) IMPLANT
PORT ACCESS TROCAR AIRSEAL 5M (TROCAR) ×2
POUCH SPECIMEN RETRIEVAL 10MM (ENDOMECHANICALS) ×1 IMPLANT
RETRACTOR WND ALEXIS 18 MED (MISCELLANEOUS) ×2 IMPLANT
RTRCTR WOUND ALEXIS 18CM MED (MISCELLANEOUS) ×3
SCISSORS LAP 5X35 DISP (ENDOMECHANICALS) ×1 IMPLANT
SCISSORS LAP 5X45 EPIX DISP (ENDOMECHANICALS) ×1 IMPLANT
SCRUB PCMX 4 OZ (MISCELLANEOUS) ×3 IMPLANT
SEAL CANN UNIV 5-8 DVNC XI (MISCELLANEOUS) IMPLANT
SEAL XI 5MM-8MM UNIVERSAL (MISCELLANEOUS) ×4
SET IRRIG TUBING LAPAROSCOPIC (IRRIGATION / IRRIGATOR) ×1 IMPLANT
SET TRI-LUMEN FLTR TB AIRSEAL (TUBING) ×1 IMPLANT
SHEARS HARMONIC ACE PLUS 45CM (MISCELLANEOUS) ×1 IMPLANT
SHEET LAVH (DRAPES) ×3 IMPLANT
SLEEVE SURGEON STRL (DRAPES) ×1 IMPLANT
SLEEVE XCEL OPT CAN 5 100 (ENDOMECHANICALS) IMPLANT
SPONGE LAP 18X18 X RAY DECT (DISPOSABLE) IMPLANT
STAPLER VISISTAT 35W (STAPLE) ×3 IMPLANT
STRIP CLOSURE SKIN 1/2X4 (GAUZE/BANDAGES/DRESSINGS) IMPLANT
SUCTION POOLE TIP (SUCTIONS) ×3 IMPLANT
SURGIFLO W/THROMBIN 8M KIT (HEMOSTASIS) ×1 IMPLANT
SUT ETHILON 3 0 PS 1 (SUTURE) ×1 IMPLANT
SUT MNCRL AB 4-0 PS2 18 (SUTURE) ×2 IMPLANT
SUT NOVA 0 T19/GS 22DT (SUTURE) IMPLANT
SUT NOVA 1 T20/GS 25DT (SUTURE) IMPLANT
SUT NOVA NAB DX-16 0-1 5-0 T12 (SUTURE) ×1 IMPLANT
SUT PROLENE 0 CT 1 CR/8 (SUTURE) IMPLANT
SUT VIC AB 2-0 CT2 27 (SUTURE) ×1 IMPLANT
SUT VIC AB 2-0 SH 27 (SUTURE)
SUT VIC AB 2-0 SH 27X BRD (SUTURE) IMPLANT
SUT VIC AB 3-0 PS2 18 (SUTURE)
SUT VIC AB 3-0 PS2 18XBRD (SUTURE) IMPLANT
SUT VIC AB 3-0 SH 27 (SUTURE)
SUT VIC AB 3-0 SH 27X BRD (SUTURE) IMPLANT
SUT VIC AB 4-0 SH 18 (SUTURE) ×3 IMPLANT
TACKER 5MM HERNIA 3.5CML NAB (ENDOMECHANICALS) IMPLANT
TOWEL OR 17X26 10 PK STRL BLUE (TOWEL DISPOSABLE) ×6 IMPLANT
TOWEL OR NON WOVEN STRL DISP B (DISPOSABLE) ×6 IMPLANT
TRAY FOLEY BAG SILVER LF 14FR (CATHETERS) ×3 IMPLANT
TRAY FOLEY W/METER SILVER 14FR (SET/KITS/TRAYS/PACK) IMPLANT
TRAY FOLEY W/METER SILVER 16FR (SET/KITS/TRAYS/PACK) IMPLANT
TRAY LAPAROSCOPIC (CUSTOM PROCEDURE TRAY) ×6 IMPLANT
TROCAR 12M 150ML BLUNT (TROCAR) ×1 IMPLANT
TROCAR BLADELESS OPT 5 100 (ENDOMECHANICALS) ×3 IMPLANT
TROCAR BLADELESS OPT 5 75 (ENDOMECHANICALS) ×3 IMPLANT
TROCAR XCEL 12X100 BLDLESS (ENDOMECHANICALS) ×3 IMPLANT
TROCAR XCEL BLUNT TIP 100MML (ENDOMECHANICALS) IMPLANT
TROCAR XCEL NON-BLD 11X100MML (ENDOMECHANICALS) IMPLANT
TUBING INSUFFLATION 10FT LAP (TUBING) ×3 IMPLANT
TUBING NON-CON 1/4 X 20 CONN (TUBING) IMPLANT
YANKAUER SUCT BULB TIP NO VENT (SUCTIONS) ×3 IMPLANT

## 2015-10-12 NOTE — H&P (Signed)
Chief Complaint:  Abdominal pain and adnexal cyst  History of Present Illness:  Gloria Fields is an 69 y.o. female well known to me from prior surgeries in 2007 and 2013.  In 2007 I performed an emergent laparotomy for purulent peritonitis from appendicits and closed her midline with a running prolene and three retention sutures of nylon.  In 2008 I performed an open repair of a ventral hernia with a 4x6 piece of Proceed mesh.  I repaired a ventral eventration with extensive laparoscopic enterolysis in 2013 and closure with the mesh primarily with interrupted Novafil sutures.  She had a CT earlier this summer that doesn't show a definite hernia but she has an adnexal mass that Dr. Denman George needs to remove.  Will proceed with laparoscopic enterolysis and removal of the adnexal mass.  Will look at repair and see if additional repair is needed.    Past Medical History  Diagnosis Date  . Esophageal reflux   . Diverticulosis of colon (without mention of hemorrhage)   . Hypertension   . Osteopenia   . ADD (attention deficit disorder)   . Anxiety   . Depression   . Back pain   . Breast cancer ([place]) 2007  . Hypercholesterolemia   . Family history of malignant neoplasm of gastrointestinal tract   . Irritable bowel syndrome   . Anemia   . Anemia, normocytic normochromic 12/25/2012    Mild, chronic, Hb 11-12 grams at least since 11/2006; screening studies & endoscopies unremarkable  . 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 Ovarian cyst   . Headache     PMH  . H/O dehydration   . Pyloric stenosis   . Ovarian cystic mass 07/07/2015  . PONV (postoperative nausea and vomiting)     also had severe itching   . Heart murmur   . Chronic kidney disease     hx of stage 3 kidney disease   . History of radiation therapy   . History of blood transfusion   . Repeated falls   . Shortness of breath dyspnea     walking distances or climbing stairs    Past Surgical  History  Procedure Laterality Date  . Appendectomy    . Hernia repair    . Uterine fibroid surgery      with subsequent scar tissue removal  . Bowel perforation  2007  . Breast lumpectomy  2007    right  . Ventral hernia repair  10/29/2012    Procedure: LAPAROSCOPIC VENTRAL HERNIA;  Surgeon: Pedro Earls, MD;  Location: [facility];  Service: General;  Laterality: N/A;  LAPRASCOPIC  ASSISTED OPEN VENTRAL HERNIA REPAIR, LAPRASCOPIC LYSIS OF ADHESIONS   . Laparoscopic lysis of adhesions  10/29/2012    Procedure: LAPAROSCOPIC LYSIS OF ADHESIONS;  Surgeon: Pedro Earls, MD;  Location: [facility];  Service: General;;  . Wisdom tooth extraction    . Esophagogastroduodenoscopy    . Colonoscopy    . Upper gastrointestinal endoscopy  Multiple  . Colonoscopy with propofol N/A 07/07/2015    Procedure: COLONOSCOPY WITH PROPOFOL;  Surgeon: Gatha Mayer, MD;  Location: [place];  Service: Endoscopy;  Laterality: N/A;  NEED ULTRA SLIM COLONOSCOPE PLEASE, possible pyloric dilation  . Esophagogastroduodenoscopy N/A 07/07/2015    Procedure: ESOPHAGOGASTRODUODENOSCOPY (EGD);  Surgeon: Gatha Mayer, MD;  Location: [facility]COPY;  Service: Endoscopy;  Laterality: N/A;  POSSIBLE PYLORIC DILATION    No current facility-administered medications for this encounter.  Morphine Family History  Problem Relation Age of Onset  . Other Father     colon rupture  . Pancreatic cancer Cousin   . Diabetes Maternal Aunt   . Prostate cancer Maternal Grandfather   . Breast cancer Maternal Aunt     X 3   Social History:   reports that she has never smoked. She has never used smokeless tobacco. She reports that she drinks alcohol. She reports that she does not use illicit drugs.   REVIEW OF SYSTEMS : Negative except for see problem list  Physical Exam:   There were no vitals taken for this visit. There is no weight on file to calculate BMI.  Gen:  WDWN WF NAD  Neurological: Alert and oriented to person,  place, and time. Motor and sensory function is grossly intact  Head: Normocephalic and atraumatic.  Eyes: Conjunctivae are normal. Pupils are equal, round, and reactive to light. No scleral icterus.  Neck: Normal range of motion. Neck supple. No tracheal deviation or thyromegaly present.  Cardiovascular:  SR without murmurs or gallops.  No carotid bruits Breast:  Not examined Respiratory: Effort normal.  No respiratory distress. No chest wall tenderness. Breath sounds normal.  No wheezes, rales or rhonchi.  Abdomen:  Midline incision healed with scars from prior retention sutures GU:  Pelvic not performed by me.  Defer to Dr. Denman George Musculoskeletal: Normal range of motion. Extremities are nontender. No cyanosis, edema or clubbing noted Lymphadenopathy: No cervical, preauricular, postauricular or axillary adenopathy is present Skin: Skin is warm and dry. No rash noted. No diaphoresis. No erythema. No pallor. Pscyh: Normal mood and affect. Behavior is normal. Judgment and thought content normal.   LABORATORY RESULTS: No results found for this or any previous visit (from the past 48 hour(s)).   RADIOLOGY RESULTS: No results found.  Problem List: Patient Active Problem List   Diagnosis Date Noted  . Endometrial stripe increased 07/28/2015  . Ovarian cancer screening 07/19/2015  . Ovarian cystic mass - left 07/07/2015  . Colon cancer screening   . Stenosis of colon ([place])   . Diverticulosis of colon without hemorrhage   . Pyloric stenosis, acquired   . 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 Anemia, normocytic normochromic 12/25/2012  . Nausea with vomiting 08/28/2011  . Esophageal reflux 08/28/2011  . Pyloric channel ulcer 08/28/2011  . GERD (gastroesophageal reflux disease) 08/15/2011  . Depression, major, recurrent ([place]) 08/15/2011  . Anxiety disorder 08/15/2011  . Diverticulosis of colon (without mention of hemorrhage) 08/15/2011     Assessment & Plan: History of ventral hernia after peritonitis;  Adnexal  Cyst for removal.   Laparoscopic enterolysis; evaluate for hernia; pelvic surgery per Dr. Duaine Dredge B. Hassell Done, MD, [facility] [phone] beeper [phone]  10/12/2015 8:46 AM

## 2015-10-12 NOTE — H&P (Signed)
Consult was requested by Dr. Alen Blew for the evaluation of Gloria Fields 69 y.o. female with a left ovarian cyst  CC:  Chief Complaint  Patient presents with  . adnexal mass    New consult    Assessment/Plan:  Gloria. Gloria Fields is a 69 y.o. year old with a left ovarian cyst which is likely benign, a thickened endometrial stripe and a recurrent ventral incisional hernia.  I performed a history, physical examination, and personally reviewed the patient's films including the CT scan from 07/03/15 and the [place] from 07/05/15.  I discussed with Gloria Fields that I believe that her ovarian cyst is most likely benign. The cyst is predominantly cystic with fine septations and no dominant solid nodularity. It is associated with a normal CA 125 tumor marker which is also reassuring. I do not believe that this cyst is causing her symptoms of abdominal pains (particularly the upper abdominal symptoms).  Because it is at low likelihood for malignancy and because it is not symptomatic, its removal is elective.  I discussed with Gloria Fields that she is at a particularly high risk for surgical complication due to her multiple prior complex surgery and known peritoneal adhesive disease. I discussed that these risks typically increase with each surgery. Therefore, she would have to feel comfortable accepting these increased risks (which include major injury to bowel, bladder, ureters, vascular structures; reoperation; wound infection or dehiscence; death) with potentially limited (if any) benefit.   She explained to me that she desires having her ventral hernia repaired at the same time as this is symptomatic. I discussed that we will facilitate her seeing Dr Hassell Done. If Dr Hassell Done feels agreeable to performing a hernia repair and lysis of adhesions, then I would perform a bilateral salpingo-oophorectomy at the same time. I would not perform hysterectomy (unless endometrial pathology is identified on today's  biopsy) because it increased the risk for infection, particularly if a mesh is placed as part of hernia repair. I discussed that the route of surgery that I would attempt would be laparoscopic with robotic assistance, however, if Dr Hassell Done feels that an open (laparotomy) approach would be best to repair this hernia, then we would defer to his preferred operative approach.  If Dr Hassell Done feels that surgery is not indicated for her surgery, then we will follow her ovarian cyst for stability with serial US examinations at 6 monthly intervals for a 12 month period.  With respect to her thickened endometrial stripe, I will followup today's biopsy. I have a low suspicion for uterine malignancy due to her lack of symptoms of bleeding. If malignancy is found, it would change our recommendations as stated above.  HPI: Gloria Fields is a 69 year old G0 who is seen in consultation at the request of Dr Alen Blew for a 6cm left ovarian cyst in the setting of a normal CA 125 and ventral hernia.  The patient was experiencing a bout of nausea and diarrhea which prompted a CT of the abdomen and pelvis being performed on 07/03/15. This revealed an approximately 6cm ovarian cyst on the left. It was followed up with a transvaginal US on 07/05/15 which also confirmed a left adnexal cyst measuring 6.5cm x 5 x3.4cm. it contained within septa mostly, with some appearing slightly more thickened and irregular. There were no nodules or solid components to the cystic mass. The left ovary appeared separate and was normal in appearance and measured 1.3 x 1.2 x 0.97 m. The right ovary was also  normal and measured 1.7 x 1.1 x 0.9 cm. The uterus was grossly normal by retroverted and measured 5.8 x 3.7 x 3.2 cm. It had a thickened endometrial stripe (for postmenopausal woman) which measured 10 mm in thickness. A CA-125 was drawn on 07/19/2015 and was normal at 12U/mL. Of note, she denies postmenopausal bleeding or abnormal discharge. She has been  menopausal since age 69.  The patient has a surgical history that is significant for a ruptured appendix operated on via a midline laparotomy remotely. She then developed a ventral hernia which required repair with mesh by her a midline incision. She also had an intervening laparotomy with myomectomy for symptomatic uterine fibroids. She subsequently developed recurrence of her ventral abdominal hernia and a proximally 4 years ago underwent a laparotomy with lysis of adhesions and repair of the hernia. The patient reports that there was a "bowel leak" secondary to the adhesive disease that was repaired at the time of her surgery with Dr. Hassell Done. The procedure was performed at [facility].  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 She had an attempted colonoscopy 2 weeks ago which was limited due to "twisted colon".  She developed recurrence of her ventral hernia in the past year. It is symptomatic, per patient, with discomfort and she desires it be repaired.   She has anxiety and depression and ADD and admits to not always taking her medications.  Her symptoms are of intermittent sharp upper abdominal pains, achy low back pains, and left abdominal fullness. She does not take pain medications for these and is unable to distinguish exacerbating or relieving activities.  Current Meds:  Outpatient Encounter Prescriptions as of 07/28/2015  Medication Sig  . amphetamine-dextroamphetamine (ADDERALL) 20 MG tablet Take 20 mg by mouth 2 (two) times daily.   Marland Kitchen b complex vitamins tablet Take 1 tablet by mouth daily.   Ileene Patrick Products (PERIDRIN-C) 200-50-150 MG TABS Take 1 capsule by mouth 2 (two) times daily.   . cholecalciferol (VITAMIN D) 1000 UNITS tablet Take 1,000 Units by mouth daily.  . clonazePAM (KLONOPIN) 1 MG tablet Take 0.5 mg by mouth 2 (two) times daily as needed. For anxiety  . dexlansoprazole (DEXILANT) 60 MG capsule Take 1 capsule (60 mg  total) by mouth daily.  . Eszopiclone (ESZOPICLONE) 3 MG TABS Take 3 mg by mouth at bedtime. Take immediately before bedtime  . Multiple Vitamins-Minerals (CENTRUM SILVER PO) Take 1 capsule by mouth daily.   . sertraline (ZOLOFT) 25 MG tablet Take 75 mg by mouth daily.   . valsartan-hydrochlorothiazide (DIOVAN HCT) 160-25 MG per tablet Take 1 tablet by mouth every morning.   . vitamin E 100 UNIT capsule Take 100 Units by mouth daily.  . diphenoxylate-atropine (LOMOTIL) 2.5-0.025 MG per tablet Take 1 tablet by mouth 4 (four) times daily as needed for diarrhea or loose stools. (Patient not taking: Reported on 07/19/2015)   No facility-administered encounter medications on file as of 07/28/2015.    Allergy:  Allergies  Allergen Reactions  . Morphine Other (See Comments)    REACTION: paranoid    Social Hx:  Social History   Social History  . Marital Status: Divorced    Spouse Name: N/A  . Number of Children: 0  . Years of Education: N/A   Occupational History  . Retired Pharmacist, hospital    Social History Main Topics  . Smoking status: Never Smoker   . Smokeless tobacco: Never Used  . Alcohol Use: 0.0 oz/week  0 Glasses of wine per week     Comment: rarely  . Drug Use: No  . Sexual Activity: Not on file   Other Topics Concern  . Not on file   Social History Narrative   She is divorced. No children. She is a retired Statistician.    Lives alone.   One caffeinated beverage daily.   06/08/2015       Past Surgical Hx:  Past Surgical History  Procedure Laterality Date  . Appendectomy    . Hernia repair    . Uterine fibroid surgery      with subsequent scar tissue removal  . Bowel perforation  2007  . Breast lumpectomy  2007    right  . Ventral hernia repair  10/29/2012    Procedure: LAPAROSCOPIC VENTRAL HERNIA; Surgeon: Pedro Earls, MD; Location: [facility]; Service: General; Laterality: N/A; LAPRASCOPIC ASSISTED OPEN VENTRAL HERNIA REPAIR, LAPRASCOPIC LYSIS OF ADHESIONS   . Laparoscopic lysis of adhesions  10/29/2012    Procedure: LAPAROSCOPIC LYSIS OF ADHESIONS; Surgeon: Pedro Earls, MD; Location: [facility]; Service: General;;  . Wisdom tooth extraction    . Esophagogastroduodenoscopy    . Colonoscopy    . Upper gastrointestinal endoscopy  Multiple  . Colonoscopy with propofol N/A 07/07/2015    Procedure: COLONOSCOPY WITH PROPOFOL; Surgeon: Gatha Mayer, MD; Location: [place]; Service: Endoscopy; Laterality: N/A; NEED ULTRA SLIM COLONOSCOPE PLEASE, possible pyloric dilation  . Esophagogastroduodenoscopy N/A 07/07/2015    Procedure: ESOPHAGOGASTRODUODENOSCOPY (EGD); Surgeon: Gatha Mayer, MD; Location: [facility]; Service: Endoscopy; Laterality: N/A; POSSIBLE PYLORIC DILATION    Past Medical Hx:  Past Medical History  Diagnosis Date  . Hiatal hernia   . Esophageal reflux   . Diverticulosis of colon (without mention of hemorrhage)   . Hypertension   . Osteopenia   . ADD (attention deficit disorder)   . Anxiety   . Depression   . Back pain   . Breast cancer 2007  . Hypercholesterolemia   . Family history of malignant neoplasm of gastrointestinal tract   . Irritable bowel syndrome   . Anemia   . Anemia, normocytic normochromic 12/25/2012    Mild, chronic, Hb 11-12 grams at least since 11/2006; screening studies & endoscopies unremarkable  . 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 12/24/2013  . Shortness of breath dyspnea     with exertion  . Ovarian cyst   . Headache     PMH  . H/O dehydration   . Pyloric stenosis   . PONV (postoperative nausea and vomiting)   . Ovarian cystic mass 07/07/2015    Past Gynecological History: G0. Used  fertility drugs in her 35's. LMP was age 7. No LMP recorded. Patient is postmenopausal.  Family Hx:  Family History  Problem Relation Age of Onset  . Other Father     colon rupture  . Pancreatic cancer Cousin   . Diabetes Maternal Aunt   . Prostate cancer Maternal Grandfather   . Breast cancer Maternal Aunt     X 3    Review of Systems:  Constitutional  Feels well,  ENT Normal appearing ears and nares bilaterally Skin/Breast  No rash, sores, jaundice, itching, dryness Cardiovascular  No chest pain, shortness of breath, or edema  Pulmonary  No cough or wheeze.  Gastro Intestinal  No nausea, vomitting, or diarrhoea. No bright red blood per rectum, no abdominal pain, change in bowel movement, or constipation.  Genito Urinary  No frequency, urgency, dysuria,  see HPI Musculo Skeletal  No myalgia, arthralgia, joint swelling or pain  Neurologic  No weakness, numbness, change in gait,  Psychology  No depression, anxiety, insomnia.   Vitals: Blood pressure 151/61, pulse 108, temperature 98.9 F (37.2 C), temperature source Oral, resp. rate 18, height 4' 11.5" (1.511 m), weight 170 lb 12.8 oz (77.474 kg), SpO2 98 %.  Physical Exam: WD in NAD Neck  Supple NROM, without any enlargements.  Lymph Node Survey No cervical supraclavicular or inguinal adenopathy Cardiovascular  Pulse normal rate, regularity and rhythm. S1 and S2 normal.  Lungs  Clear to auscultation bilateraly, without wheezes/crackles/rhonchi. Good air movement.  Skin  No rash/lesions/breakdown  Psychiatry  Alert and oriented to person, place, and time  Abdomen  Normoactive bowel sounds, abdomen soft, non-tender and obese with large, soft ,reducable incisional hernia in the midline upper abdomen.  Back No CVA tenderness Genito Urinary  Vulva/vagina: Normal external female genitalia. No lesions. No discharge or  bleeding. Bladder/urethra: No lesions or masses, well supported bladder Vagina: normal Cervix: Normal appearing, no lesions. Uterus: Small, mobile, no parametrial involvement or nodularity. Adnexa: no palpable masses. Rectal  Good tone, no masses no cul de sac nodularity.  Extremities  No bilateral cyanosis, clubbing or edema.       Donaciano Eva, MD

## 2015-10-12 NOTE — Brief Op Note (Signed)
10/12/2015  3:41 PM  PATIENT:  Gloria Fields  69 y.o. female  PRE-OPERATIVE DIAGNOSIS:  LEFT OVARIAN CYST  POST-OPERATIVE DIAGNOSIS:  LEFT OVARIAN CYST  PROCEDURE:  Procedure(s): LAPAROSCOPIC  LEFT SALPINGO OOPHORECTOMY AND LYSIS OF ADHESIONS  (Bilateral) LAPAROSCOPIC VENTRAL HERNIA REPAIR  (N/A)  SURGEON:  Surgeon(s) and Role: Panel 1:    * Everitt Amber, MD - Primary    * Lahoma Crocker, MD - Assisting  Panel 2:    * Johnathan Hausen, MD - Primary    * Johnathan Hausen, MD - Primary  PHYSICIAN ASSISTANT:   ASSISTANTS: Everitt Amber, MD, Lahoma Crocker, MD   ANESTHESIA:   general  EBL:  Total I/O In: 2200 [I.V.:2200] Out: 200 [Blood:200]  BLOOD ADMINISTERED:none  DRAINS: (one) Jackson-Pratt drain(s) with closed bulb suction in the pelvis   LOCAL MEDICATIONS USED:  BUPIVICAINE   SPECIMEN:  Source of Specimen:  left ovary cyst  DISPOSITION OF SPECIMEN:  PATHOLOGY  COUNTS:  YES  TOURNIQUET:  * No tourniquets in log *  DICTATION: .Other Dictation: Dictation Number [id number]  PLAN OF CARE: Admit to inpatient   PATIENT DISPOSITION:  PACU - hemodynamically stable.   Delay start of Pharmacological VTE agent (>24hrs) due to surgical blood loss or risk of bleeding: no

## 2015-10-12 NOTE — Progress Notes (Signed)
Moderate amt thin red drainage around JP site. Reinforced with ABD pad and clean ABD binder applied.

## 2015-10-12 NOTE — Anesthesia Procedure Notes (Signed)
Procedure Name: Intubation Date/Time: 10/12/2015 10:48 AM Performed by: Danley Danker L Patient Re-evaluated:Patient Re-evaluated prior to inductionOxygen Delivery Method: Circle system utilized Preoxygenation: Pre-oxygenation with 100% oxygen Intubation Type: IV induction Ventilation: Mask ventilation without difficulty and Oral airway inserted - appropriate to patient size Laryngoscope Size: Sabra Heck and 2 Grade View: Grade I Tube type: Oral Tube size: 7.5 mm Number of attempts: 1 Airway Equipment and Method: Stylet Placement Confirmation: ETT inserted through vocal cords under direct vision,  breath sounds checked- equal and bilateral and positive ETCO2 Secured at: 21 cm Tube secured with: Tape Dental Injury: Teeth and Oropharynx as per pre-operative assessment

## 2015-10-12 NOTE — Anesthesia Preprocedure Evaluation (Addendum)
Anesthesia Evaluation  Patient identified by MRN, date of birth, ID band Patient awake    Reviewed: Allergy & Precautions, NPO status , Patient's Chart, lab work & pertinent test results  Airway Mallampati: II  TM Distance: >3 FB Neck ROM: Full    Dental   Pulmonary shortness of breath,    breath sounds clear to auscultation       Cardiovascular hypertension,  Rhythm:Regular Rate:Normal     Neuro/Psych    GI/Hepatic PUD, GERD  ,  Endo/Other  negative endocrine ROS  Renal/GU Renal disease     Musculoskeletal   Abdominal   Peds  Hematology   Anesthesia Other Findings   Reproductive/Obstetrics                            Anesthesia Physical Anesthesia Plan  ASA: III  Anesthesia Plan: General   Post-op Pain Management:    Induction: Intravenous  Airway Management Planned: Oral ETT  Additional Equipment:   Intra-op Plan:   Post-operative Plan: Extubation in OR  Informed Consent: I have reviewed the patients History and Physical, chart, labs and discussed the procedure including the risks, benefits and alternatives for the proposed anesthesia with the patient or authorized representative who has indicated his/her understanding and acceptance.   Dental advisory given  Plan Discussed with: CRNA and Anesthesiologist  Anesthesia Plan Comments:         Anesthesia Quick Evaluation

## 2015-10-12 NOTE — Transfer of Care (Signed)
Immediate Anesthesia Transfer of Care Note  Patient: Gloria Fields  Procedure(s) Performed: Procedure(s): LAPAROSCOPIC  LEFT SALPINGO OOPHORECTOMY AND LYSIS OF ADHESIONS  (Bilateral) LAPAROSCOPIC VENTRAL HERNIA REPAIR  (N/A)  Patient Location: PACU  Anesthesia Type:General  Level of Consciousness:  sedated, patient cooperative and responds to stimulation  Airway & Oxygen Therapy:Patient Spontanous Breathing and Patient connected to face mask oxgen  Post-op Assessment:  Report given to PACU RN and Post -op Vital signs reviewed and stable  Post vital signs:  Reviewed and stable  Last Vitals:  Filed Vitals:   10/12/15 0854  BP: 159/77  Pulse: 85  Temp: 36.7 C  Resp: 20    Complications: No apparent anesthesia complications

## 2015-10-12 NOTE — Op Note (Signed)
NAMELOGAN, SICILIANO              ACCOUNT NO.:  [id number]  MEDICAL RECORD NO.:  [id number]  LOCATION:  [id number]                         [facility]AC[facility]  [facility]  PHYSICIAN:  Isabel Caprice. Hassell Done, MD  DATE OF BIRTH:  February 23, 1946  DATE OF PROCEDURE:  10/12/2015 DATE OF DISCHARGE:                              OPERATIVE REPORT   PREOPERATIVE DIAGNOSIS:  Left adnexal cyst and ventral eventration with prior hernia repair.  PROCEDURE:  Laparoscopic enterolysis and primary repair of small epigastric incisional hernia.  SURGEON:  Isabel Caprice. Hassell Done, M.D.  ASSISTANT:  Everitt Amber.  ANESTHESIA:  General endotracheal.  DESCRIPTION OF PROCEDURE:  The patient was taken to room #1 and placed in lithotomy, after general anesthesia had been administered.  The abdomen was prepped per Dr. Denman George.  She was draped sterilely and after time out, I proceeded to enter the abdomen through the left upper quadrant with a 5 mm 0-degree Optiview without difficulty.  We were able to get around the side of the omental adhesions, which were up in the midline and the pelvis was relatively free.  Dr. Denman George was then able to remove this left adnexal cyst, ultimately converting the trocar sites for docking of the robot, and that is dictated in a separate note. Following the completion of the left adrenal cystectomy, we came in with the direction of the scope from the lower pelvic port, with sharp and blunt dissection.  I took down the adhesions in the midline to the point, where she has had the area of questionable bulging.  CT scan previously had not really shown anything definite in terms of fascial defect.  I did find a small area of fascial weakness in that spot and after taking down the bowel I irrigated.  I did not see any evidence of any enterotomies, but felt like she already has mesh in that location. I would not just try to do another underlay of mesh.  I therefore cut down on this and could palpate things manually.  I  brought some bowel loops up and irrigated very well.  The bowel wall seemed to be intact and there was no evidence of enterotomies, so I went ahead and repaired this primarily with #1 Novafil simple sutures.  We then went back with the laparoscope and examined our repair and it appeared to be intact and looked good.  I placed a drain in the pelvis and brought this out through the right lower quadrant port.  Completion after irrigating with several liters.  I went ahead and deflated the abdomen.  The port sites were closed with 5-0 Monocryl and LiquiBand.  The patient seemed to tolerate the procedure well and was taken to the recovery room in satisfactory condition.     Isabel Caprice Hassell Done, MD    MBM/MEDQ  D:  10/12/2015  T:  10/12/2015  Job:  [id number]

## 2015-10-12 NOTE — Op Note (Signed)
OPERATIVE NOTE  Date: 10/12/15  Surgeon: Gloria Fields   Assistants: Dr Lahoma Crocker (an MD assistant was necessary for tissue manipulation, management of robotic instrumentation, retraction and positioning due to the complexity of the case and hospital policies).   Anesthesia: General endotracheal anesthesia  ASA Class: 2,   Pre-operative Diagnosis: left adnexal mass, history of prior myomectomy and multiple abdominal surgeries, ventral hernia.  Post-operative Diagnosis: same, benign ovarian cyst  Operation: Laparoscopic converted to robotic left salpingo-oophorectomy, lysis of adhesions  Surgeon: Gloria Fields  Co Surgeon: Dr Johnathan Hausen  Assistant Surgeon: none  Anesthesia: GET  Urine Output: 100  Operative Findings:  : Dense adhesions between small intestine and omentum and anterior abdominal wall, Dense adhesiosn between sigmoid colon and left tube and ovary. Left ovarian cyst measures 5cm and was retroperitoneal. Surgically absent right tube and ovary. Adhesions between posterior uterus and rectum.  Frozen section revealed benign ovarian cyst.   Procedure Details  The patient was seen in the Holding Room. The risks, benefits, complications, treatment options, and expected outcomes were discussed with the patient.  The patient concurred with the proposed plan, giving informed consent.  The site of surgery properly noted/marked. The patient was identified as Gloria Fields and the procedure verified as a laparoscopic BSO and hernia repair. A Time Out was held and the above information confirmed.  After induction of anesthesia, the patient was draped and prepped in the usual sterile manner. Pt was placed in supine position after anesthesia and draped and prepped in the usual sterile manner. The abdominal drape was placed after the CholoraPrep had been allowed to dry for 3 minutes.  Her arms were tucked to her side with all appropriate precautions.  The  shoulders were stabilized with padded shoulder blocks on the acromium processes.  The patient was placed in the semi-lithotomy position in [place].  The perineum was prepped with Betadine.  Foley catheter was placed.  A sterile speculum was placed in the vagina.  The cervix was grasped with a single-tooth tenaculum and dilated with Kennon Rounds dilators.  The ZUMI uterine manipulator with a medium colpotomizer ring was placed without difficulty.  A second time-out was performed.  OG tube placement was confirmed and to suction.   Procedure:  Quarter percent marcaine was injected into all incision sites prior to making them with an 11 blade scalpel. A 5 mm skin incision was made in the umbilicus.  Dr Hassell Done assisted with entry. The 5 mm Optiview port and scope was used for direct entry.  Opening pressure was under 10 mm CO2.  The abdomen was insufflated and the findings were noted as above.   At this point and all points during the procedure, the patient's intra-abdominal pressure did not exceed 15 mmHg. The patient was placed in steep trendelenberg to visualize pelvic anatomy. Next, a 5 mm skin incision was made in the left lower quadrant and a 5 mm disposable port was placed through this intraperitoneally under direct visualization.  A 67mm incision was made 2cm cephalad to the pubic symphysis in the midline and a 27mm port was placed through this under direct visualization. A 14mm incision was made in the right lower quadrant and a 5 mm disposable port was placed through this intraperitoneally under direct visualization. These ports were placed at locations lateral to the central adhesions. Bowel was away into the upper abdomen.    The sigmoid colon was dissected from its dense adhesions to the pelvic side wall and left  ovary. The left retroperitoneum was opened with monopolar shears parallel to the left IP ligament to enter the retroperitoneal space. Using careful dissection with the shears and a blunt grasper,  the pararectal space was opened to identify the left ureter in the medial leaf of the broad ligament. Due to the dense retroperitoneal adhesions and sigmoid colon adhesions to the mass, adequate visualization and dissection was not possible with the conventional laparoscopic approach. A decision was made to convert to robotic approach and the disposable lateral ports were convered to robotic ports. The robot was docked.  The left ureter was identified in the left retroperitoneum at the level of the pelvic brim by mobilizing the sigmoid colon medially.  A window was sharply created above the level of the ureter to skeletonize both the left IP ligament and left utero-ovarian ligaments. The bipolar graspers were utilized to generate bipolar current and seal and transect these vascular pedicles with visualization of the ureter throughout. The ovary was carefully bluntly and sharply dissected from its adhesions to the left pelvic fossa and the posterior uterus and sigmoid. This skeletonized teh left utero-ovarian ligmament which was then bipolar sealed and transected. The freed the left tube and ovary was placed in an endocatch bag intact and retrieved from the suprapubic port. The specimen was sent for frozen section which revealed benign cyst.  The right pelvic fossa was evaluated. The adhesiosn between the terminal ileum and cecum and pelvic brim were taken down with sharp dissection. The right great vessels were identified and the ureter, but clearly no right adnexal structures were preseent from the pelvic brim to the level of the right uterine cornua. It was determined that the patient must have had a RSO at a previous surgery.  At this point in the BSO component of the procedure was completed.  At this point on the procedure was taken over by Dr Hassell Done who performed lysis of adhesions and a ventral hernia repair. Please refer to the separate dictation for these details.  The uterine manipulator was  removed.  Gloria Eva, MD

## 2015-10-13 LAB — BASIC METABOLIC PANEL
Anion gap: 6 (ref 5–15)
BUN: 18 mg/dL (ref 6–20)
CO2: 26 mmol/L (ref 22–32)
Calcium: 8.8 mg/dL — ABNORMAL LOW (ref 8.9–10.3)
Chloride: 105 mmol/L (ref 101–111)
Creatinine, Ser: 0.93 mg/dL (ref 0.44–1.00)
GFR calc Af Amer: >60 mL/min (ref >60)
GFR calc non Af Amer: >60 mL/min (ref >60)
Glucose, Bld: 144 mg/dL — ABNORMAL HIGH (ref 65–99)
Potassium: 4.6 mmol/L (ref 3.5–5.1)
Sodium: 137 mmol/L (ref 135–145)

## 2015-10-13 LAB — CBC
HCT: 28.9 % — ABNORMAL LOW (ref 36.0–46.0)
Hemoglobin: 9.5 g/dL — ABNORMAL LOW (ref 12.0–15.0)
MCH: 30.4 pg (ref 26.0–34.0)
MCHC: 32.9 g/dL (ref 30.0–36.0)
MCV: 92.3 fL (ref 78.0–100.0)
Platelets: 191 10*3/uL (ref 150–400)
RBC: 3.13 MIL/uL — ABNORMAL LOW (ref 3.87–5.11)
RDW: 13.1 % (ref 11.5–15.5)
WBC: 9.6 10*3/uL (ref 4.0–10.5)

## 2015-10-13 MED ORDER — POLYVINYL ALCOHOL 1.4 % OP SOLN
1.0000 [drp] | Freq: Three times a day (TID) | OPHTHALMIC | Status: DC | PRN
  Filled 2015-10-13: qty 15

## 2015-10-13 MED ORDER — VALSARTAN-HYDROCHLOROTHIAZIDE 160-25 MG PO TABS: 1.0000 | ORAL_TABLET | Freq: Every morning | ORAL | Status: DC

## 2015-10-13 MED ORDER — IRBESARTAN 150 MG PO TABS
150.0000 mg | ORAL_TABLET | Freq: Every day | ORAL | Status: DC
  Administered 2015-10-13 – 2015-10-15 (×3): 150 mg via ORAL
  Filled 2015-10-13 (×3): qty 1

## 2015-10-13 MED ORDER — AMPHETAMINE-DEXTROAMPHETAMINE 10 MG PO TABS
20.0000 mg | ORAL_TABLET | Freq: Two times a day (BID) | ORAL | Status: DC
  Administered 2015-10-13 – 2015-10-14 (×3): 20 mg via ORAL
  Filled 2015-10-13 (×3): qty 2

## 2015-10-13 MED ORDER — SERTRALINE HCL 50 MG PO TABS
50.0000 mg | ORAL_TABLET | Freq: Every day | ORAL | Status: DC
  Administered 2015-10-13 – 2015-10-15 (×3): 50 mg via ORAL
  Filled 2015-10-13 (×3): qty 1

## 2015-10-13 MED ORDER — HYDROCHLOROTHIAZIDE 25 MG PO TABS
25.0000 mg | ORAL_TABLET | Freq: Every day | ORAL | Status: DC
  Administered 2015-10-13 – 2015-10-15 (×3): 25 mg via ORAL
  Filled 2015-10-13 (×3): qty 1

## 2015-10-13 MED ORDER — HYPROMELLOSE (GONIOSCOPIC) 2.5 % OP SOLN: 1.0000 [drp] | Freq: Three times a day (TID) | OPHTHALMIC | Status: DC | PRN

## 2015-10-13 MED ORDER — CLONAZEPAM 1 MG PO TABS
1.0000 mg | ORAL_TABLET | Freq: Three times a day (TID) | ORAL | Status: DC | PRN
  Administered 2015-10-13 – 2015-10-14 (×4): 1 mg via ORAL
  Filled 2015-10-13 (×4): qty 1

## 2015-10-13 NOTE — Progress Notes (Signed)
1 Day Post-Op Procedure(s) (LRB): LAPAROSCOPIC  LEFT SALPINGO OOPHORECTOMY AND LYSIS OF ADHESIONS  (Bilateral) LAPAROSCOPIC VENTRAL HERNIA REPAIR  (N/A)  Subjective: Patient reports doingwell with no major complaints. Some upper abdominal and low pelvic pain. Denies N&V.    Objective: Vital signs in last 24 hours: Temp:  [97.3 F (36.3 C)-98.2 F (36.8 C)] 97.4 F (36.3 C) (11/18 0510) Pulse Rate:  [61-101] 71 (11/18 0510) Resp:  [11-20] 14 (11/18 0510) BP: (121-159)/(55-90) 121/55 mmHg (11/18 0510) SpO2:  [97 %-100 %] 100 % (11/18 0510) Weight:  [169 lb 6 oz (76.828 kg)] 169 lb 6 oz (76.828 kg) (11/17 0854) Last BM Date: 10/12/15  Intake/Output from previous day: 11/17 0701 - 11/18 0700 In: 2801.7 [I.V.:2801.7] Out: 1970 [Urine:1300; Drains:470; Blood:200]  Physical Examination: General: alert and cooperative Resp: clear to auscultation bilaterally Cardio: regular rate and rhythm, S1, S2 normal, no murmur, click, rub or gallop GI: soft, non-tender; bowel sounds normal; no masses,  no organomegaly, incision: clean and dry and binder in place  Labs: WBC/Hgb/Hct/Plts:  9.6/9.5/28.9/191 (11/18 0440) BUN/Cr/glu/ALT/AST/amyl/lip:  18/0.93/--/--/--/--/-- (11/18 0440)   Assessment:  69 y.o. s/p Procedure(s): LAPAROSCOPIC  LEFT SALPINGO OOPHORECTOMY AND LYSIS OF ADHESIONS  LAPAROSCOPIC VENTRAL HERNIA REPAIR : stable Pain:  Pain is well-controlled on PCA or oral medications.  CV: stable .  GI:  Tolerating po: Yes   Advance diet as tolerated.  FEN: Consider hep lock IVF today.  Prophylaxis: regimen to be chosen by surgical team.  Plan: Advance diet Encourage ambulation Advance to PO medication Discontinue IV fluids anticipate discharge when bowel function is normal, in accordance with surgical team's desires. Dispo:  Discharge plan to include : The patient is to be discharged to home.   LOS: 1 day    Donaciano Eva 10/13/2015, 8:40 AM

## 2015-10-13 NOTE — Progress Notes (Signed)
Patient ID: Gloria Fields, female   DOB: [birth year], 69 y.o.   MRN: [id number] [facility] Surgery Progress Note:   1 Day Post-Op  Subjective: Mental status is clear.  Wants anxiety meds.  Objective: Vital signs in last 24 hours: Temp:  [97.3 F (36.3 C)-98.2 F (36.8 C)] 97.4 F (36.3 C) (11/18 0510) Pulse Rate:  [61-101] 71 (11/18 0510) Resp:  [11-19] 14 (11/18 0510) BP: (121-156)/(55-90) 121/55 mmHg (11/18 0510) SpO2:  [97 %-100 %] 100 % (11/18 0510)  Intake/Output from previous day: 11/17 0701 - 11/18 0700 In: 2801.7 [I.V.:2801.7] Out: 1970 [Urine:1300; Drains:470; Blood:200] Intake/Output this shift: Total I/O In: 360 [P.O.:360] Out: 560 [Urine:550; Drains:10]  Physical Exam: Work of breathing is normal.  Foley discontinued.  Incisions OK.  JP serosanguinuous.  Abdominal binder in place  Lab Results:  Results for orders placed or performed during the hospital encounter of 10/12/15 (from the past 48 hour(s))  CBC     Status: Abnormal   Collection Time: 10/12/15  5:54 PM  Result Value Ref Range   WBC 10.3 4.0 - 10.5 K/uL   RBC 3.49 (L) 3.87 - 5.11 MIL/uL   Hemoglobin 10.5 (L) 12.0 - 15.0 g/dL   HCT 31.7 (L) 36.0 - 46.0 %   MCV 90.8 78.0 - 100.0 fL   MCH 30.1 26.0 - 34.0 pg   MCHC 33.1 30.0 - 36.0 g/dL   RDW 12.9 11.5 - 15.5 %   Platelets 196 150 - 400 K/uL  Creatinine, serum     Status: Abnormal   Collection Time: 10/12/15  5:54 PM  Result Value Ref Range   Creatinine, Ser 0.96 0.44 - 1.00 mg/dL   GFR calc non Af Amer 59 (L) >60 mL/min   GFR calc Af Amer >60 >60 mL/min    Comment: (NOTE) The eGFR has been calculated using the CKD EPI equation. This calculation has not been validated in all clinical situations. eGFR's persistently <60 mL/min signify possible Chronic Kidney Disease.   CBC     Status: Abnormal   Collection Time: 10/13/15  4:40 AM  Result Value Ref Range   WBC 9.6 4.0 - 10.5 K/uL   RBC 3.13 (L) 3.87 - 5.11 MIL/uL   Hemoglobin 9.5 (L)  12.0 - 15.0 g/dL   HCT 28.9 (L) 36.0 - 46.0 %   MCV 92.3 78.0 - 100.0 fL   MCH 30.4 26.0 - 34.0 pg   MCHC 32.9 30.0 - 36.0 g/dL   RDW 13.1 11.5 - 15.5 %   Platelets 191 150 - 400 K/uL  Basic metabolic panel     Status: Abnormal   Collection Time: 10/13/15  4:40 AM  Result Value Ref Range   Sodium 137 135 - 145 mmol/L   Potassium 4.6 3.5 - 5.1 mmol/L   Chloride 105 101 - 111 mmol/L   CO2 26 22 - 32 mmol/L   Glucose, Bld 144 (H) 65 - 99 mg/dL   BUN 18 6 - 20 mg/dL   Creatinine, Ser 0.93 0.44 - 1.00 mg/dL   Calcium 8.8 (L) 8.9 - 10.3 mg/dL   GFR calc non Af Amer >60 >60 mL/min   GFR calc Af Amer >60 >60 mL/min    Comment: (NOTE) The eGFR has been calculated using the CKD EPI equation. This calculation has not been validated in all clinical situations. eGFR's persistently <60 mL/min signify possible Chronic Kidney Disease.    Anion gap 6 5 - 15    Radiology/Results: No results found.  Anti-infectives: Anti-infectives    Start     Dose/Rate Route Frequency Ordered Stop   10/12/15 1015  ceFAZolin (ANCEF) IVPB 2 g/50 mL premix     2 g 100 mL/hr over 30 Minutes Intravenous  Once 10/12/15 1014 10/12/15 1100      Assessment/Plan: Problem List: Patient Active Problem List   Diagnosis Date Noted  . Ventral hernia 10/12/2015  . Endometrial stripe increased 07/28/2015  . Ovarian cancer screening 07/19/2015  . Ovarian cystic mass - left 07/07/2015  . Colon cancer screening   . Stenosis of colon ([place])   . Diverticulosis of colon without hemorrhage   . Pyloric stenosis, acquired   . 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 ([place]) 12/24/2013  . Anemia, normocytic normochromic 12/25/2012  . Nausea with vomiting 08/28/2011  . Esophageal reflux 08/28/2011  . Pyloric channel ulcer 08/28/2011  . GERD (gastroesophageal reflux disease) 08/15/2011  . Depression, major, recurrent ([place]) 08/15/2011  . Anxiety disorder 08/15/2011  . Diverticulosis of colon (without  mention of hemorrhage) 08/15/2011    Stable postop.  Begin clear liquids.  Hopeful remove drain Sat and discharge over the weekend.   1 Day Post-Op    LOS: 1 day   Matt B. Hassell Done, MD, [facility] [phone] beeper [phone]  10/13/2015 9:07 AM

## 2015-10-14 MED ORDER — IBUPROFEN 200 MG PO TABS: 600.0000 mg | ORAL_TABLET | Freq: Four times a day (QID) | ORAL | Status: DC | PRN

## 2015-10-14 MED ORDER — OXYCODONE-ACETAMINOPHEN 5-325 MG PO TABS
1.0000 | ORAL_TABLET | ORAL | Status: DC | PRN
  Administered 2015-10-14: 1 via ORAL
  Administered 2015-10-14 (×2): 2 via ORAL
  Administered 2015-10-15: 1 via ORAL
  Filled 2015-10-14 (×2): qty 1
  Filled 2015-10-14 (×2): qty 2

## 2015-10-14 NOTE — Progress Notes (Signed)
2 Days Post-Op  Subjective: hasnt walked in halls since surgery. Did not spend much time OOB yesterday. No n/v. Sore. Tolerated diet. Been doing IS  Objective: Vital signs in last 24 hours: Temp:  [97.8 F (36.6 C)-98.9 F (37.2 C)] 98.4 F (36.9 C) (11/19 0516) Pulse Rate:  [74-87] 74 (11/19 0516) Resp:  [15-18] 15 (11/19 0516) BP: (113-141)/(43-50) 121/44 mmHg (11/19 0516) SpO2:  [98 %-100 %] 100 % (11/19 0516) Last BM Date: 10/12/15  Intake/Output from previous day: 11/18 0701 - 11/19 0700 In: 3760 [P.O.:960; I.V.:2800] Out: 2701 [Urine:2650; Drains:50; Stool:1] Intake/Output this shift: Total I/O In: 240 [P.O.:240] Out: 400 [Urine:400]  Alert, nad cta b/l Reg Expected mild TTP, incisions c/d/i, obese, drain - serous  Lab Results:   Recent Labs  10/12/15 1754 10/13/15 0440  WBC 10.3 9.6  HGB 10.5* 9.5*  HCT 31.7* 28.9*  PLT 196 191   BMET  Recent Labs  10/12/15 1754 10/13/15 0440  NA  --  137  K  --  4.6  CL  --  105  CO2  --  26  GLUCOSE  --  144*  BUN  --  18  CREATININE 0.96 0.93  CALCIUM  --  8.8*   PT/INR No results for input(s): LABPROT, INR in the last 72 hours. ABG No results for input(s): PHART, HCO3 in the last 72 hours.  Invalid input(s): PCO2, PO2  Studies/Results: No results found.  Anti-infectives: Anti-infectives    Start     Dose/Rate Route Frequency Ordered Stop   10/12/15 1015  ceFAZolin (ANCEF) IVPB 2 g/50 mL premix     2 g 100 mL/hr over 30 Minutes Intravenous  Once 10/12/15 1014 10/12/15 1100      Assessment/Plan: s/p Procedure(s): LAPAROSCOPIC  LEFT SALPINGO OOPHORECTOMY AND LYSIS OF ADHESIONS  (Bilateral) LAPAROSCOPIC VENTRAL HERNIA REPAIR  (N/A)  Discussed importance of ambulating in halls, oob Will add PO pain meds prn Decrease IVF Cont chemical vte prophylaxis prob home Sunday  Gloria Fields. Gloria Pulling, MD, FACS General, Bariatric, & Minimally Invasive Surgery [facility], [place]   LOS: 2 days     Gloria Fields 10/14/2015

## 2015-10-15 MED ORDER — OXYCODONE-ACETAMINOPHEN 5-325 MG PO TABS: 1.0000 | ORAL_TABLET | ORAL | Status: DC | PRN

## 2015-10-15 NOTE — Discharge Instructions (Signed)
[facility], [place]   HERNIA REPAIR: POST OP INSTRUCTIONS  Always review your discharge instruction sheet given to you by the facility where your surgery was performed. IF YOU HAVE DISABILITY OR FAMILY LEAVE FORMS, YOU MUST BRING THEM TO THE OFFICE FOR PROCESSING.   DO NOT GIVE THEM TO YOUR DOCTOR.  1. A  prescription for pain medication may be given to you upon discharge.  Take your pain medication as prescribed, if needed.  If narcotic pain medicine is not needed, then you may take acetaminophen (Tylenol) or ibuprofen (Advil) as needed. 2. Take your usually prescribed medications unless otherwise directed. 3. If you need a refill on your pain medication, please contact your pharmacy.  They will contact our office to request authorization. Prescriptions will not be filled after 5 pm or on week-ends. 4. You should follow a light diet the first 24 hours after arrival home, such as soup and crackers, etc.  Be sure to include lots of fluids daily.  Resume your normal diet the day after surgery. 5. Most patients will experience some swelling and bruising around the incision.  Ice packs and reclining will help.  Swelling and bruising can take several days to resolve.  6. It is common to experience some constipation if taking pain medication after surgery.  Increasing fluid intake and taking a stool softener (such as Colace) will usually help or prevent this problem from occurring.  A mild laxative (Milk of Magnesia or Miralax) should be taken according to package directions if there are no bowel movements after 48 hours. 7. Unless discharge instructions indicate otherwise, you may remove your bandages 24-48 hours after surgery, and you may shower at that time.  You may have steri-strips (small skin tapes) in place directly over the incision.  These strips should be left on the skin for 7-10 days.  If your surgeon used skin glue on the incision, you may shower in 24 hours.  The glue will flake  off over the next 2-3 weeks.  Any sutures or staples will be removed at the office during your follow-up visit. 8. ACTIVITIES:  You may resume regular (light) daily activities beginning the next day--such as daily self-care, walking, climbing stairs--gradually increasing activities as tolerated.  You may have sexual intercourse when it is comfortable.  Refrain from any heavy lifting or straining until approved by your doctor. a. You may drive when you are no longer taking prescription pain medication, you can comfortably wear a seatbelt, and you can safely maneuver your car and apply brakes. b. RETURN TO WORK:  9. You should see your doctor in the office for a follow-up appointment approximately 2-3 weeks after your surgery.  Make sure that you call for this appointment within a day or two after you arrive home to insure a convenient appointment time. 10. OTHER INSTRUCTIONS: DO NOT LIFT, PUSH, OR PULL ANYTHING GREATER THAN 10 POUNDS FOR 6 WEEKS 11. WEAR ABDOMINAL BINDER DURING DAYTIME    WHEN TO CALL YOUR DOCTOR: 1. Fever over 101.0 2. Inability to urinate 3. Nausea and/or vomiting 4. Extreme swelling or bruising 5. Continued bleeding from incision. 6. Increased pain, redness, or drainage from the incision  The clinic staff is available to answer your questions during regular business hours.  Please dont hesitate to call and ask to speak to one of the nurses for clinical concerns.  If you have a medical emergency, go to the nearest emergency room or call 911.  A surgeon from [employer]  [facility] is always on call at the hospital   [address] [place], [place], [place]  [address] ?  [address], [place], [place]   [id number] [phone] ? [phone] ? FAX [phone] Web site: [email or web address]

## 2015-10-15 NOTE — Progress Notes (Signed)
Assessment unchanged. Pt and daughter verbalized understanding of dc instructions through teach back including follow up care and when to call MD. Script x 1 given to pt as provided by MD. JP drain dc'd prior to dc home and gauze applied to site. Gauze and tape provided for pt to carry home. Discharged via wc to front entrance to meet awaiting vehicle to carry home. Accompanied by friend, daughter and NT.

## 2015-10-15 NOTE — Discharge Summary (Signed)
Physician Discharge Summary  Gloria Fields [id number] DOB: 03-30-1946 DOA: 10/12/2015  PCP: REDMON,NOELLE, PA-C  Admit date: 10/12/2015 Discharge date: 10/15/2015  Recommendations for Outpatient Follow-up:   Follow-up Information    Follow up with Johnathan Hausen B, MD. Schedule an appointment as soon as possible for a visit in 3 weeks.   Specialty:  General Surgery   Why:  For wound re-check   Contact information:   [place] [address] [phone]       Follow up with Donaciano Eva, MD. Schedule an appointment as soon as possible for a visit in 2 weeks.   Specialty:  Obstetrics and Gynecology   Why:  For wound re-check   Contact information:   [place] [address] [phone]      Discharge Diagnoses:  Active Problems:   Ovarian cystic mass - left   Ventral hernia Anxiety HTN  Surgical Procedure: laparoscopic enterolysis; open primary of epigastric hernia, Laparoscopic converted to robotic left salpingo-oophorectomy   Discharge Condition: good Disposition: home  Diet recommendation: regular  Filed Weights   10/12/15 0854  Weight: 76.828 kg (169 lb 6 oz)    History of present illness:    Hospital Course:  Patient came in for planned procedure. Dr Denman George performed left salpingo-oophorectomy. Dr Hassell Done did lap lysis of adhesions and primary repair of epigastric hernia. She was kept for pain control and return of bowel function. Her diet was advanced which she tolerated. Her pain was eventually managed with oral pain meds. On day of discharge, her vitals were stable. She was tolerating a diet, ambulating, pain controlled. Her surgical drain was removed. I discussed dc instructions with the pt and family  BP 126/60 mmHg  Pulse 73  Temp(Src) 97.8 F (36.6 C) (Oral)  Resp 16  Ht 4\' 11"  (1.499 m)  Wt 76.828 kg (169 lb 6 oz)  BMI 34.19 kg/m2  SpO2 99%  Gen: alert, NAD, non-toxic appearing Pupils: equal,  no scleral icterus Pulm: Lungs clear to auscultation, symmetric chest rise CV: regular rate and rhythm Abd: soft, nontender, nondistended. healing trocar sites. No cellulitis. No incisional hernia; drain -serosang Ext: no edema, no calf tenderness Skin: no rash, no jaundice   Discharge Instructions  Discharge Instructions    Call MD for:  persistant dizziness or light-headedness    Complete by:  As directed      Call MD for:  persistant nausea and vomiting    Complete by:  As directed      Call MD for:  redness, tenderness, or signs of infection (pain, swelling, redness, odor or green/yellow discharge around incision site)    Complete by:  As directed      Call MD for:  severe uncontrolled pain    Complete by:  As directed      Call MD for:    Complete by:  As directed   Call MD for temp >101     Diet - low sodium heart healthy    Complete by:  As directed      Discharge instructions    Complete by:  As directed   See [facility] discharge instructions     Increase activity slowly    Complete by:  As directed             Medication List    TAKE these medications        amphetamine-dextroamphetamine 20 MG tablet  Commonly known as:  ADDERALL  Take  20 mg by mouth 2 (two) times daily. Takes at 7am and noon     antiseptic oral rinse Liqd  15 mLs by Mouth Rinse route as needed for dry mouth (uses when she brushes her teeth).     b complex vitamins tablet  Take 1 tablet by mouth daily.     CALCIUM 600 + D PO  Take 1 tablet by mouth daily.     CENTRUM SILVER PO  Take 1 capsule by mouth daily.     cholecalciferol 1000 UNITS tablet  Commonly known as:  VITAMIN D  Take 1,000 Units by mouth daily.     clonazePAM 1 MG tablet  Commonly known as:  KLONOPIN  Take 1 mg by mouth 3 (three) times daily as needed for anxiety. For anxiety     dexlansoprazole 60 MG capsule  Commonly known as:  DEXILANT  Take 1 capsule (60 mg total) by mouth daily.     DIOVAN HCT 160-25 MG tablet   Generic drug:  valsartan-hydrochlorothiazide  Take 1 tablet by mouth every morning.     diphenoxylate-atropine 2.5-0.025 MG tablet  Commonly known as:  LOMOTIL  Take 1 tablet by mouth 4 (four) times daily as needed for diarrhea or loose stools.     eszopiclone 3 MG Tabs  Generic drug:  Eszopiclone  Take 3 mg by mouth at bedtime. Take immediately before bedtime     ferrous sulfate 325 (65 FE) MG tablet  Take 325 mg by mouth daily with breakfast.     hydroxypropyl methylcellulose / hypromellose 2.5 % ophthalmic solution  Commonly known as:  ISOPTO TEARS / GONIOVISC  Place 1 drop into both eyes 3 (three) times daily as needed for dry eyes.     oxyCODONE-acetaminophen 5-325 MG tablet  Commonly known as:  PERCOCET/ROXICET  Take 1-2 tablets by mouth every 4 (four) hours as needed for moderate pain.     PERIDRIN-C 200-50-150 MG Tabs  Take 1 capsule by mouth daily.     sertraline 25 MG tablet  Commonly known as:  ZOLOFT  Take 50 mg by mouth daily.     vitamin E 100 UNIT capsule  Take 200 Units by mouth daily.           Follow-up Information    Follow up with Johnathan Hausen B, MD. Schedule an appointment as soon as possible for a visit in 3 weeks.   Specialty:  General Surgery   Why:  For wound re-check   Contact information:   [place] [address] [phone]       Follow up with Donaciano Eva, MD. Schedule an appointment as soon as possible for a visit in 2 weeks.   Specialty:  Obstetrics and Gynecology   Why:  For wound re-check   Contact information:   [place] [address] [phone]        The results of significant diagnostics from this hospitalization (including imaging, microbiology, ancillary and laboratory) are listed below for reference.    Significant Diagnostic Studies: No results found.  Microbiology: No results found for this or any previous visit (from the past 240 hour(s)).   Labs: Basic  Metabolic Panel:  Recent Labs Lab 10/09/15 0855 10/12/15 1754 10/13/15 0440  NA 140  --  137  K 4.4  --  4.6  CL 103  --  105  CO2 27  --  26  GLUCOSE 114*  --  144*  BUN 29*  --  18  CREATININE 1.13* 0.96 0.93  CALCIUM 10.0  --  8.8*   Liver Function Tests:  Recent Labs Lab 10/09/15 0855  AST 25  ALT 19  ALKPHOS 75  BILITOT 0.5  PROT 7.9  ALBUMIN 4.6   No results for input(s): LIPASE, AMYLASE in the last 168 hours. No results for input(s): AMMONIA in the last 168 hours. CBC:  Recent Labs Lab 10/09/15 0855 10/12/15 1754 10/13/15 0440  WBC 7.4 10.3 9.6  NEUTROABS 4.9  --   --   HGB 12.2 10.5* 9.5*  HCT 37.2 31.7* 28.9*  MCV 93.0 90.8 92.3  PLT 240 196 191   Cardiac Enzymes: No results for input(s): CKTOTAL, CKMB, CKMBINDEX, TROPONINI in the last 168 hours. BNP: BNP (last 3 results) No results for input(s): BNP in the last 8760 hours.  ProBNP (last 3 results) No results for input(s): PROBNP in the last 8760 hours.  CBG: No results for input(s): GLUCAP in the last 168 hours.  Active Problems:   Ovarian cystic mass - left   Ventral hernia   Time coordinating discharge: 15 minutes  Signed:  Gayland Curry, MD [facility], [place] [phone] 10/15/2015, 8:47 AM

## 2015-10-16 ENCOUNTER — Telehealth: Payer: Self-pay

## 2015-10-16 NOTE — Telephone Encounter (Signed)
Post-Op follow up call orders received from [place] to contact the patient to see how she is doing. Patient contacted , patient states she is doing well and her pain is "minimal" . Patient states she is only requiring tylenol for her intermittent discomfort. Post-Op follow visit with Dr Everitt Amber scheduled for December 05 , 2016 at 2:45 PM . Patient aware to call with any changes , questions or concerns.

## 2015-10-18 NOTE — Anesthesia Postprocedure Evaluation (Signed)
Anesthesia Post Note  Patient: Gloria Fields  Procedure(s) Performed: Procedure(s) (LRB): LAPAROSCOPIC  LEFT SALPINGO OOPHORECTOMY AND LYSIS OF ADHESIONS  (Bilateral) LAPAROSCOPIC VENTRAL HERNIA REPAIR  (N/A)  Patient location during evaluation: PACU Anesthesia Type: General Level of consciousness: awake Pain management: pain level controlled Vital Signs Assessment: post-procedure vital signs reviewed and stable Respiratory status: spontaneous breathing Cardiovascular status: stable Anesthetic complications: no    Last Vitals:  Filed Vitals:   10/14/15 2201 10/15/15 0639  BP: 110/44 126/60  Pulse:  73  Temp:  36.6 C  Resp:  16    Last Pain:  Filed Vitals:   10/15/15 1450  PainSc: 2                  EDWARDS,Jes Costales

## 2015-10-23 NOTE — Anesthesia Postprocedure Evaluation (Signed)
Anesthesia Post Note  Patient: Gloria Fields  Procedure(s) Performed: Procedure(s) (LRB): LAPAROSCOPIC  LEFT SALPINGO OOPHORECTOMY AND LYSIS OF ADHESIONS  (Bilateral) LAPAROSCOPIC VENTRAL HERNIA REPAIR  (N/A)  Patient location during evaluation: PACU Anesthesia Type: General Level of consciousness: awake Pain management: pain level controlled Vital Signs Assessment: post-procedure vital signs reviewed and stable Respiratory status: spontaneous breathing Cardiovascular status: blood pressure returned to baseline Anesthetic complications: no    Last Vitals:  Filed Vitals:   10/14/15 2201 10/15/15 0639  BP: 110/44 126/60  Pulse:  73  Temp:  36.6 C  Resp:  16    Last Pain:  Filed Vitals:   10/15/15 1450  PainSc: 2                  EDWARDS,Darlean Warmoth

## 2015-10-23 NOTE — Addendum Note (Signed)
Addendum  created 10/23/15 0124 by Finis Bud, MD   Modules edited: Clinical Notes   Clinical Notes:  File: [id number]

## 2015-10-30 ENCOUNTER — Encounter: Payer: Self-pay | Admitting: Gynecologic Oncology

## 2015-10-30 ENCOUNTER — Ambulatory Visit: Payer: Medicare PPO | Attending: Gynecologic Oncology | Admitting: Gynecologic Oncology

## 2015-10-30 VITALS — BP 129/90 | HR 83 | Temp 98.5°F | Resp 18 | Ht 59.0 in | Wt 168.3 lb

## 2015-10-30 DIAGNOSIS — N949 Unspecified condition associated with female genital organs and menstrual cycle: Secondary | ICD-10-CM | POA: Diagnosis not present

## 2015-10-30 DIAGNOSIS — D271 Benign neoplasm of left ovary: Secondary | ICD-10-CM

## 2015-10-30 DIAGNOSIS — N9489 Other specified conditions associated with female genital organs and menstrual cycle: Secondary | ICD-10-CM

## 2015-10-30 DIAGNOSIS — G8918 Other acute postprocedural pain: Secondary | ICD-10-CM | POA: Diagnosis not present

## 2015-10-30 MED ORDER — OXYCODONE-ACETAMINOPHEN 5-325 MG PO TABS: 1.0000 | ORAL_TABLET | ORAL | Status: DC | PRN

## 2015-10-30 NOTE — Patient Instructions (Signed)
Please call for any questions or concerns.  Follow up with your primary care physician.

## 2015-10-30 NOTE — Progress Notes (Signed)
Followup Note: Gyn-Onc  Consult was initially requested by Dr. Alen Blew for the evaluation of MARLETTA OLA 69 y.o. female with a left ovarian cyst  CC:  Chief Complaint  Patient presents with  . adnexal mass    Post-Op follow up    Assessment/Plan:  Ms. NEHARIKA BARBOZA  is a 69 y.o.  year old who is s/p robotic assisted LSO for a benign left ovarian cystadenoma on 10/12/15. She had dense adhesions and simultaneous adhesiolysis and hernia repair with Dr Hassell Done.  Doing well postop with no issues. Represcribed 30 of oxy/tylenol for persistent postop pain. No followup required unless new issue develops.   HPI: Jenyah Tobiason is a 69 year old G0 who was seen in consultation at the request of Dr Alen Blew for a 6cm left ovarian cyst in the setting of a normal CA 125 and ventral hernia.  The patient was experiencing a bout of nausea and diarrhea which prompted a CT of the abdomen and pelvis being performed on 07/03/15. This revealed an approximately 6cm ovarian cyst on the left. It was followed up with a transvaginal US on 07/05/15 which also confirmed a left adnexal cyst measuring 6.5cm x 5 x3.4cm.  it contained within septa mostly, with some appearing slightly more thickened and irregular. There were no nodules or solid components to the cystic mass. The left ovary appeared separate and was normal in appearance and measured 1.3 x 1.2 x 0.97 m. The right ovary was also normal and measured 1.7 x 1.1 x 0.9 cm. The uterus was grossly normal by retroverted and measured 5.8 x 3.7 x 3.2 cm. It had a thickened endometrial stripe (for postmenopausal woman) which measured 10 mm in thickness. A CA-125 was drawn on 07/19/2015 and was normal at 12U/mL. Of note, she denies postmenopausal bleeding or abnormal discharge. She has been menopausal since age 64.  The patient has a surgical history that is significant for a ruptured appendix operated on via a midline laparotomy remotely. She then developed a ventral  hernia which required repair with mesh by her a midline incision. She also had an intervening laparotomy with myomectomy for symptomatic uterine fibroids. She subsequently developed recurrence of her ventral abdominal hernia and a proximally 4 years ago underwent a laparotomy with lysis of adhesions and repair of the hernia. The patient reports that there was a "bowel leak" secondary to the adhesive disease that was repaired at the time of her surgery with Dr. Hassell Done. The procedure was performed at [facility].  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 She had an attempted colonoscopy 2 weeks ago which was limited due to "twisted colon".  She developed recurrence of her ventral hernia in the past year. It is symptomatic, per patient, with discomfort and she desires it be repaired.   She has anxiety and depression and ADD and admits to not always taking her medications.  Interval Hx:  She underwent a robotic BSO, lysis of adhesions and ventral hernia repair on 10/12/15 with Dr's Denman George and Hassell Done. She was found to have a surgically absent right ovary at the time of surgery. Surgery was uncomplicated as was her recovery. She has done well at home.  Current Meds:  Outpatient Encounter Prescriptions as of 10/30/2015  Medication Sig  . amphetamine-dextroamphetamine (ADDERALL) 20 MG tablet Take 20 mg by mouth 2 (two) times daily. Takes at [place] and noon  . antiseptic oral rinse (BIOTENE) LIQD 15 mLs by Mouth Rinse route as needed for dry  mouth (uses when she brushes her teeth).  Marland Kitchen b complex vitamins tablet Take 1 tablet by mouth daily.   Ileene Patrick Products (PERIDRIN-C) 200-50-150 MG TABS Take 1 capsule by mouth daily.   . Calcium Carb-Cholecalciferol (CALCIUM 600 + D PO) Take 1 tablet by mouth daily.  . cholecalciferol (VITAMIN D) 1000 UNITS tablet Take 1,000 Units by mouth daily.  . clonazePAM (KLONOPIN) 1 MG tablet Take 1 mg by mouth 3 (three) times daily as needed for  anxiety. For anxiety  . dexlansoprazole (DEXILANT) 60 MG capsule Take 1 capsule (60 mg total) by mouth daily.  . Eszopiclone (ESZOPICLONE) 3 MG TABS Take 3 mg by mouth at bedtime. Take immediately before bedtime  . ferrous sulfate 325 (65 FE) MG tablet Take 325 mg by mouth daily with breakfast.  . hydroxypropyl methylcellulose / hypromellose (ISOPTO TEARS / GONIOVISC) 2.5 % ophthalmic solution Place 1 drop into both eyes 3 (three) times daily as needed for dry eyes.  . Multiple Vitamins-Minerals (CENTRUM SILVER PO) Take 1 capsule by mouth daily.   Marland Kitchen oxyCODONE-acetaminophen (PERCOCET/ROXICET) 5-325 MG tablet Take 1-2 tablets by mouth every 4 (four) hours as needed for moderate pain.  Marland Kitchen PREVIDENT 5000 DRY MOUTH 1.1 % GEL dental gel   . sertraline (ZOLOFT) 25 MG tablet Take 50 mg by mouth daily.   . valsartan-hydrochlorothiazide (DIOVAN HCT) 160-25 MG per tablet Take 1 tablet by mouth every morning.   . vitamin E 100 UNIT capsule Take 200 Units by mouth daily.   . [DISCONTINUED] oxyCODONE-acetaminophen (PERCOCET/ROXICET) 5-325 MG tablet Take 1-2 tablets by mouth every 4 (four) hours as needed for moderate pain.  . diphenoxylate-atropine (LOMOTIL) 2.5-0.025 MG tablet Take 1 tablet by mouth 4 (four) times daily as needed for diarrhea or loose stools.   No facility-administered encounter medications on file as of 10/30/2015.    Allergy:  Allergies  Allergen Reactions  . Morphine Other (See Comments)    REACTION: paranoid    Social Hx:   Social History   Social History  . Marital Status: Divorced    Spouse Name: N/A  . Number of Children: 0  . Years of Education: N/A   Occupational History  . Retired Pharmacist, hospital    Social History Main Topics  . Smoking status: Never Smoker   . Smokeless tobacco: Never Used  . Alcohol Use: 0.0 oz/week    0 Glasses of wine per week     Comment: rarely  . Drug Use: No  . Sexual Activity: Not on file   Other Topics Concern  . Not on file   Social  History Narrative   She is divorced. No children. She is a retired Statistician.    Lives alone.   One caffeinated beverage daily.   06/08/2015       Past Surgical Hx:  Past Surgical History  Procedure Laterality Date  . Appendectomy    . Hernia repair    . Uterine fibroid surgery      with subsequent scar tissue removal  . Bowel perforation  2007  . Breast lumpectomy  2007    right  . Ventral hernia repair  10/29/2012    Procedure: LAPAROSCOPIC VENTRAL HERNIA;  Surgeon: Pedro Earls, MD;  Location: [facility];  Service: General;  Laterality: N/A;  LAPRASCOPIC  ASSISTED OPEN VENTRAL HERNIA REPAIR, LAPRASCOPIC LYSIS OF ADHESIONS   . Laparoscopic lysis of adhesions  10/29/2012    Procedure: LAPAROSCOPIC LYSIS OF ADHESIONS;  Surgeon: Pedro Earls, MD;  Location: [facility];  Service: General;;  . Wisdom tooth extraction    . Esophagogastroduodenoscopy    . Colonoscopy    . Upper gastrointestinal endoscopy  Multiple  . Colonoscopy with propofol N/A 07/07/2015    Procedure: COLONOSCOPY WITH PROPOFOL;  Surgeon: Gatha Mayer, MD;  Location: [place];  Service: Endoscopy;  Laterality: N/A;  NEED ULTRA SLIM COLONOSCOPE PLEASE, possible pyloric dilation  . Esophagogastroduodenoscopy N/A 07/07/2015    Procedure: ESOPHAGOGASTRODUODENOSCOPY (EGD);  Surgeon: Gatha Mayer, MD;  Location[facility];  Service: Endoscopy;  Laterality: N/A;  POSSIBLE PYLORIC DILATION  . Laparoscopic bilateral salpingo oopherectomy Bilateral 10/12/2015    Procedure: LAPAROSCOPIC  LEFT SALPINGO OOPHORECTOMY AND LYSIS OF ADHESIONS ;  Surgeon: Everitt Amber, MD;  Location: [facility];  Service: Gynecology;  Laterality: Bilateral;  . Ventral hernia repair N/A 10/12/2015    Procedure: LAPAROSCOPIC VENTRAL HERNIA REPAIR ;  Surgeon: Johnathan Hausen, MD;  Location: [facility];  Service: General;  Laterality: N/A;    Past Medical Hx:  Past Medical History  Diagnosis Date  . Esophageal reflux   . Diverticulosis of colon  (without mention of hemorrhage)   . Hypertension   . Osteopenia   . ADD (attention deficit disorder)   . Anxiety   . Depression   . Back pain   . Breast cancer ([place]) 2007  . Hypercholesterolemia   . Family history of malignant neoplasm of gastrointestinal tract   . Irritable bowel syndrome   . Anemia   . Anemia, normocytic normochromic 12/25/2012    Mild, chronic, Hb 11-12 grams at least since 11/2006; screening studies & endoscopies unremarkable  . 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 ([place]) 12/24/2013  . Ovarian cyst   . Headache     PMH  . H/O dehydration   . Pyloric stenosis   . Ovarian cystic mass 07/07/2015  . PONV (postoperative nausea and vomiting)     also had severe itching   . Heart murmur   . Chronic kidney disease     hx of stage 3 kidney disease   . History of radiation therapy   . History of blood transfusion   . Repeated falls   . Shortness of breath dyspnea     walking distances or climbing stairs    Past Gynecological History:  G0. Used fertility drugs in her 64's. LMP was age 50. No LMP recorded. Patient is postmenopausal.  Family Hx:  Family History  Problem Relation Age of Onset  . Other Father     colon rupture  . Pancreatic cancer Cousin   . Diabetes Maternal Aunt   . Prostate cancer Maternal Grandfather   . Breast cancer Maternal Aunt     X 3    Review of Systems:  Constitutional  Feels well,    ENT Normal appearing ears and nares bilaterally Skin/Breast  No rash, sores, jaundice, itching, dryness Cardiovascular  No chest pain, shortness of breath, or edema  Pulmonary  No cough or wheeze.  Gastro Intestinal  No nausea, vomitting, or diarrhoea. No bright red blood per rectum, no abdominal pain, change in bowel movement, or constipation.  Genito Urinary  No frequency, urgency, dysuria, see HPI Musculo Skeletal  No myalgia, arthralgia, joint swelling or pain  Neurologic  No weakness, numbness, change in gait,   Psychology  No depression, anxiety, insomnia.   Vitals:  Blood pressure 129/90, pulse 83, temperature 98.5 F (36.9 C), temperature source Oral, resp. rate 18, height 4\' 11"  (1.499 m),  weight 168 lb 4.8 oz (76.34 kg), SpO2 94 %.  Physical Exam: WD in NAD Neck  Supple NROM, without any enlargements.  Lymph Node Survey No cervical supraclavicular or inguinal adenopathy Cardiovascular  Pulse normal rate, regularity and rhythm. S1 and S2 normal.  Lungs  Clear to auscultation bilateraly, without wheezes/crackles/rhonchi. Good air movement.  Skin  No rash/lesions/breakdown  Psychiatry  Alert and oriented to person, place, and time  Abdomen  Normoactive bowel sounds, abdomen soft, non-tender and obese with large, soft ,no hernia in the midline upper abdomen. Her incisions are well healed. Back No CVA tenderness Genito Urinary  deferred Extremities  No bilateral cyanosis, clubbing or edema.    Donaciano Eva, MD  10/30/2015, 3:26 PM

## 2015-11-10 DIAGNOSIS — F331 Major depressive disorder, recurrent, moderate: Secondary | ICD-10-CM | POA: Diagnosis not present

## 2015-11-13 DIAGNOSIS — Z23 Encounter for immunization: Secondary | ICD-10-CM | POA: Diagnosis not present

## 2015-11-13 DIAGNOSIS — K219 Gastro-esophageal reflux disease without esophagitis: Secondary | ICD-10-CM | POA: Diagnosis not present

## 2015-11-13 DIAGNOSIS — M21619 Bunion of unspecified foot: Secondary | ICD-10-CM | POA: Diagnosis not present

## 2015-11-13 DIAGNOSIS — I10 Essential (primary) hypertension: Secondary | ICD-10-CM | POA: Diagnosis not present

## 2015-11-14 ENCOUNTER — Ambulatory Visit: Payer: Self-pay

## 2015-11-14 ENCOUNTER — Encounter (INDEPENDENT_AMBULATORY_CARE_PROVIDER_SITE_OTHER): Payer: Medicare PPO | Admitting: Podiatry

## 2015-11-14 DIAGNOSIS — M2012 Hallux valgus (acquired), left foot: Secondary | ICD-10-CM

## 2015-11-14 NOTE — Progress Notes (Signed)
This encounter was created in error - please disregard.

## 2015-12-11 ENCOUNTER — Other Ambulatory Visit: Payer: Self-pay

## 2015-12-11 DIAGNOSIS — Z1231 Encounter for screening mammogram for malignant neoplasm of breast: Secondary | ICD-10-CM

## 2015-12-19 ENCOUNTER — Encounter: Payer: Self-pay | Admitting: Podiatry

## 2015-12-19 ENCOUNTER — Ambulatory Visit (INDEPENDENT_AMBULATORY_CARE_PROVIDER_SITE_OTHER): Payer: Medicare Other

## 2015-12-19 ENCOUNTER — Ambulatory Visit (INDEPENDENT_AMBULATORY_CARE_PROVIDER_SITE_OTHER): Payer: Medicare Other | Admitting: Podiatry

## 2015-12-19 VITALS — BP 142/70 | HR 95 | Resp 16 | Ht 59.0 in | Wt 169.0 lb

## 2015-12-19 DIAGNOSIS — M2012 Hallux valgus (acquired), left foot: Secondary | ICD-10-CM

## 2015-12-19 DIAGNOSIS — M79672 Pain in left foot: Secondary | ICD-10-CM

## 2015-12-19 NOTE — Progress Notes (Signed)
   Subjective:    Patient ID: Gloria Fields, female    DOB: 08-16-1946, 70 y.o.   MRN: [id number]  HPI: She presents today with a chief complaint of a painful bunion times many years left foot. She states that has just scant to the point where I can hardly wear shoes and my daily activities are limited by painful ambulation. She is requesting surgical intervention rather than conservative treatment.    Review of Systems  All other systems reviewed and are negative.      Objective:   Physical Exam: 70 year old female vital signs stable alert and oriented 3 pulses are strongly palpable in no apparent distress. Neurologic sensorium is intact versus lasting monofilament. Deep tendon reflex are intact. An muscle strength +5 over 5 dorsiflexion plantar flexors and inverters everters onto the musculatures intact. Orthopedic evaluation demonstrates hallux abductovalgus deformity of the left foot. Tender on palpation along the medial aspect of the first metatarsophalangeal joint and with range of motion of the first metatarsophalangeal joint. Radiographs of the left foot today do demonstrate metatarsus adductus resulting in a increase in the first inner metatarsal angle greater than the apparent value. Cutaneous evaluationsupple well-hydrated cutis no erythema edema saline as drainage or odor. No ingrown toenails fibular border hallux left. She does have some irritation associated with this juxtaposition of the hallux and second toe which will be corrected by bunion surgery.        Assessment & Plan:  Hallux abductovalgus deformity of the left foot.  Plan: We discussed etiology pathology conservative versus surgical therapies. At this point she is requesting surgical intervention regarding this left foot stating that she has tolerated her long enough. We consented her today for an [facility] bunion repair of the left foot with screw fixation. I discussed this with her in great detail today. We did  discuss the possible postop complications which may include but are not limited to postop pain bleeding swelling infection recurrence need further surgery. I answered all the questions to the best of my ability in layman's terms. We dispensed a cam walker for her postop recovery. We are requesting medical clearance prior to surgery.

## 2015-12-19 NOTE — Patient Instructions (Addendum)
Betadine Soak Instructions  Purchase an 8 oz. bottle of BETADINE solution (Povidone)  THE DAY AFTER THE PROCEDURE  Place 1 tablespoon of betadine solution in a quart of warm tap water.  Submerge your foot or feet with outer bandage intact for the initial soak; this will allow the bandage to become moist and wet for easy lift off.  Once you remove your bandage, continue to soak in the solution for 20 minutes.  This soak should be done twice a day.  Next, remove your foot or feet from solution, blot dry the affected area and cover.  You may use a band aid large enough to cover the area or use gauze and tape.  Apply other medications to the area as directed by the doctor such as cortisporin otic solution (ear drops) or neosporin.  IF YOUR SKIN BECOMES IRRITATED WHILE USING THESE INSTRUCTIONS, IT IS OKAY TO SWITCH TO EPSOM SALTS AND WATER OR WHITE VINEGAR AND WATER.  Long Term Care Instructions-Post Nail Surgery  You have had your ingrown toenail and root treated with a chemical.  This chemical causes a burn that will drain and ooze like a blister.  This can drain for 6-8 weeks or longer.  It is important to keep this area clean, covered, and follow the soaking instructions dispensed at the time of your surgery.  This area will eventually dry and form a scab.  Once the scab forms you no longer need to soak or apply a dressing.  If at any time you experience an increase in pain, redness, swelling, or drainage, you should contact the office as soon as possible.Pre-Operative Instructions  Congratulations, you have decided to take an important step to improving your quality of life.  You can be assured that the doctors of [facility] will be with you every step of the way.  1. Plan to be at the surgery center/hospital at least 1 (one) hour prior to your scheduled time unless otherwise directed by the surgical center/hospital staff.  You must have a responsible adult accompany you, remain during the  surgery and drive you home.  Make sure you have directions to the surgical center/hospital and know how to get there on time. 2. For hospital based surgery you will need to obtain a history and physical form from your family physician within 1 month prior to the date of surgery- we will give you a form for you primary physician.  3. We make every effort to accommodate the date you request for surgery.  There are however, times where surgery dates or times have to be moved.  We will contact you as soon as possible if a change in schedule is required.   4. No Aspirin/Ibuprofen for one week before surgery.  If you are on aspirin, any non-steroidal anti-inflammatory medications (Mobic, Aleve, Ibuprofen) you should stop taking it 7 days prior to your surgery.  You make take Tylenol  For pain prior to surgery.  5. Medications- If you are taking daily heart and blood pressure medications, seizure, reflux, allergy, asthma, anxiety, pain or diabetes medications, make sure the surgery center/hospital is aware before the day of surgery so they may notify you which medications to take or avoid the day of surgery. 6. No food or drink after midnight the night before surgery unless directed otherwise by surgical center/hospital staff. 7. No alcoholic beverages 24 hours prior to surgery.  No smoking 24 hours prior to or 24 hours after surgery. 8. Wear loose pants or   shorts- loose enough to fit over bandages, boots, and casts. 9. No slip on shoes, sneakers are best. 10. Bring your boot with you to the surgery center/hospital.  Also bring crutches or a walker if your physician has prescribed it for you.  If you do not have this equipment, it will be provided for you after surgery. 11. If you have not been contracted by the surgery center/hospital by the day before your surgery, call to confirm the date and time of your surgery. 12. Leave-time from work may vary depending on the type of surgery you have.  Appropriate  arrangements should be made prior to surgery with your employer. 13. Prescriptions will be provided immediately following surgery by your doctor.  Have these filled as soon as possible after surgery and take the medication as directed. 14. Remove nail polish on the operative foot. 15. Wash the night before surgery.  The night before surgery wash the foot and leg well with the antibacterial soap provided and water paying special attention to beneath the toenails and in between the toes.  Rinse thoroughly with water and dry well with a towel.  Perform this wash unless told not to do so by your physician.  Enclosed: 1 Ice pack (please put in freezer the night before surgery)   1 Hibiclens skin cleaner   Pre-op Instructions  If you have any questions regarding the instructions, do not hesitate to call our office.  [place]: [address] [place], [place] [address] [phone]  [place]: [address] [place], [place] [address] [phone]  [place]: [address]  [place],  [address] [phone]  Dr. Richard Tuchman DPM, Dr. Norman Regal DPM Dr. Richard Sikora DPM, Dr. M. Todd Hyatt DPM, Dr. Kathryn Egerton DPM 

## 2015-12-21 ENCOUNTER — Ambulatory Visit (HOSPITAL_BASED_OUTPATIENT_CLINIC_OR_DEPARTMENT_OTHER): Payer: 59 | Admitting: Oncology

## 2015-12-21 ENCOUNTER — Telehealth: Payer: Self-pay | Admitting: Oncology

## 2015-12-21 ENCOUNTER — Other Ambulatory Visit (HOSPITAL_BASED_OUTPATIENT_CLINIC_OR_DEPARTMENT_OTHER): Payer: 59

## 2015-12-21 VITALS — BP 155/63 | HR 102 | Temp 98.2°F | Resp 18 | Wt 169.6 lb

## 2015-12-21 DIAGNOSIS — C50519 Malignant neoplasm of lower-outer quadrant of unspecified female breast: Secondary | ICD-10-CM

## 2015-12-21 DIAGNOSIS — Z853 Personal history of malignant neoplasm of breast: Secondary | ICD-10-CM

## 2015-12-21 DIAGNOSIS — D649 Anemia, unspecified: Secondary | ICD-10-CM

## 2015-12-21 DIAGNOSIS — D500 Iron deficiency anemia secondary to blood loss (chronic): Secondary | ICD-10-CM

## 2015-12-21 LAB — CBC WITH DIFFERENTIAL/PLATELET
BASO%: 0.4 % (ref 0.0–2.0)
Basophils Absolute: 0 10*3/uL (ref 0.0–0.1)
EOS%: 2.2 % (ref 0.0–7.0)
Eosinophils Absolute: 0.2 10*3/uL (ref 0.0–0.5)
HCT: 35.5 % (ref 34.8–46.6)
HGB: 11.6 g/dL (ref 11.6–15.9)
LYMPH%: 38 % (ref 14.0–49.7)
MCH: 29.1 pg (ref 25.1–34.0)
MCHC: 32.7 g/dL (ref 31.5–36.0)
MCV: 89.2 fL (ref 79.5–101.0)
MONO#: 0.4 10*3/uL (ref 0.1–0.9)
MONO%: 4.9 % (ref 0.0–14.0)
NEUT#: 4.1 10*3/uL (ref 1.5–6.5)
NEUT%: 54.5 % (ref 38.4–76.8)
Platelets: 222 10*3/uL (ref 145–400)
RBC: 3.98 10*6/uL (ref 3.70–5.45)
RDW: 12.9 % (ref 11.2–14.5)
WBC: 7.6 10*3/uL (ref 3.9–10.3)
lymph#: 2.9 10*3/uL (ref 0.9–3.3)

## 2015-12-21 LAB — COMPREHENSIVE METABOLIC PANEL
ALT: 16 U/L (ref 0–55)
AST: 18 U/L (ref 5–34)
Albumin: 4.1 g/dL (ref 3.5–5.0)
Alkaline Phosphatase: 77 U/L (ref 40–150)
Anion Gap: 11 mEq/L (ref 3–11)
BUN: 25.8 mg/dL (ref 7.0–26.0)
CO2: 24 mEq/L (ref 22–29)
Calcium: 9.6 mg/dL (ref 8.4–10.4)
Chloride: 104 mEq/L (ref 98–109)
Creatinine: 1.1 mg/dL (ref 0.6–1.1)
EGFR: 49 mL/min/{1.73_m2} — ABNORMAL LOW (ref >90)
Glucose: 147 mg/dl — ABNORMAL HIGH (ref 70–140)
Potassium: 4 mEq/L (ref 3.5–5.1)
Sodium: 139 mEq/L (ref 136–145)
Total Bilirubin: <0.3 mg/dL (ref 0.20–1.20)
Total Protein: 7.6 g/dL (ref 6.4–8.3)

## 2015-12-21 NOTE — Telephone Encounter (Signed)
Pt confirmed labs/ov per 01/26 POF, gave pt AVS and Calendar... KJ °

## 2015-12-21 NOTE — Progress Notes (Signed)
Hematology and Oncology Follow Up Visit  Gloria Fields [id number] November 28, 1945 70 y.o. 12/21/2015 1:30 PM   Principle Diagnosis: 70 year old woman diagnosed with breast cancer in 2007. 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 She also has chronic mild anemia.  She was found to have a ovarian mass on the left side measuring 6 cm in August 2016. The final pathology confirmed an ovarian cyst without malignancy.  Prior therapy:   She underwent lumpectomy 11/04/2006. Estrogen receptor 99% positive, progesterone receptor 74%, Ki67 proliferation marker 6%, HER-2/neu by fish no amplification. She received breast radiation followed by 5 years of Arimidex. She has been in remission since her surgery.  She is status post laparoscopic left salpingo-oophorectomy and lysis of adhesion as well as hernia repair on 10/12/2015. The final pathology from her ovarian mass was benign consistent with ovarian cyst.  Interim History:   [name] presents today for a follow-up visit. Since the last visit, she underwent left ovarian resection laparoscopically in November 2016. She tolerated well without any postoperative complications. Since her operation, she regained all activities of daily living without any decline. She reports no abdominal pain or distention. Has not reported any GYN bleeding. She does not report any breast masses or tenderness.  She was taken iron supplements in the past but have stopped it because of dyspepsia.   She denies any headache or change in vision. She does not report any fevers or chills or sweats. He does not report any chest pain, palpitation orthopnea. She does not report any nausea, vomiting, abdominal pain. She denies any vaginal bleeding or discharge. She does not report any skeletal complaints of arthralgias or myalgias. She does not report any cough or hemoptysis. Rest of  review of systems unremarkable.   Medications: reviewed  Allergies:  Allergies  Allergen Reactions  . Morphine Other (See Comments)    REACTION: paranoid     Physical Exam: Blood pressure 155/63, pulse 102, temperature 98.2 F (36.8 C), temperature source Oral, resp. rate 18, weight 169 lb 9.6 oz (76.93 kg), SpO2 96 %. Wt Readings from Last 3 Encounters:  12/21/15 169 lb 9.6 oz (76.93 kg)  12/19/15 169 lb (76.658 kg)  10/30/15 168 lb 4.8 oz (76.34 kg)     ECOG 0 General appearance: Alert, awake woman without distress. HENNT: No oral ulcers or lesions. Lymph nodes: No cervical, supraclavicular, or axillary lymphadenopathy Lungs: Clear to auscultation, resonant to percussion throughout Heart: Regular rhythm, no murmur, no gallop, no rub, no click, no edema Abdomen: Soft, nontender, normal bowel sounds, no mass, no organomegaly. No shifting dullness or ascites. Extremities: No edema, no clubbing or cyanosis. Musculoskeletal: no joint deformities Neurologic: Alert, oriented, no deficits noted. Skin: No rash or ecchymosis  Lab Results: CBC W/Diff    Component Value Date/Time   WBC 7.6 12/21/2015 1255   WBC 9.6 10/13/2015 0440   RBC 3.98 12/21/2015 1255   RBC 3.13* 10/13/2015 0440   HGB 11.6 12/21/2015 1255   HGB 9.5* 10/13/2015 0440   HCT 35.5 12/21/2015 1255   HCT 28.9* 10/13/2015 0440   PLT 222 12/21/2015 1255   PLT 191 10/13/2015 0440   MCV 89.2 12/21/2015 1255   MCV 92.3 10/13/2015 0440   MCH 29.1 12/21/2015 1255   MCH 30.4 10/13/2015 0440   MCHC 32.7 12/21/2015 1255   MCHC 32.9 10/13/2015 0440   RDW 12.9 12/21/2015 1255  RDW 13.1 10/13/2015 0440   LYMPHSABS 2.9 12/21/2015 1255   LYMPHSABS 1.9 10/09/2015 0855   MONOABS 0.4 12/21/2015 1255   MONOABS 0.4 10/09/2015 0855   EOSABS 0.2 12/21/2015 1255   EOSABS 0.2 10/09/2015 0855   BASOSABS 0.0 12/21/2015 1255   BASOSABS 0.0 10/09/2015 0855     Chemistry      Component Value Date/Time   NA 137 10/13/2015  0440   NA 139 07/19/2015 0907   K 4.6 10/13/2015 0440   K 3.6 07/19/2015 0907   CL 105 10/13/2015 0440   CO2 26 10/13/2015 0440   CO2 25 07/19/2015 0907   BUN 18 10/13/2015 0440   BUN 17.0 07/19/2015 0907   CREATININE 0.93 10/13/2015 0440   CREATININE 1.0 07/19/2015 0907      Component Value Date/Time   CALCIUM 8.8* 10/13/2015 0440   CALCIUM 10.0 07/19/2015 0907   ALKPHOS 75 10/09/2015 0855   ALKPHOS 84 07/19/2015 0907   AST 25 10/09/2015 0855   AST 15 07/19/2015 0907   ALT 19 10/09/2015 0855   ALT 16 07/19/2015 0907   BILITOT 0.5 10/09/2015 0855   BILITOT 0.29 07/19/2015 2998       Impression: 70 year old woman with the following issues  1. Stage II ,2.6 cm, ER/PR positive HER-2 negative cancer right breast. She has no evidence of disease at this time and she is up to date on her mammography. Her next mammogram scheduled for February 2017.  2. 6.5 cm complex left adnexal mass : She is status post laparoscopic oophorectomy with a pathology revealed ovarian cyst completed in November 2016.  3. Anemia: Her hemoglobin is excellent at this time without any evidence of worsening anemia. He is no longer taking any iron supplement at this time and I will check her iron stores in the next visit.  4. 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 She underwent lysis of adhesions in November 2016 and her symptoms appear to be improved.   5. Follow-up: Will be in 6 months.      Zola Button, MD 1/26/20171:30 PM

## 2016-01-04 ENCOUNTER — Ambulatory Visit
Admission: RE | Admit: 2016-01-04 | Discharge: 2016-01-04 | Disposition: A | Discharge status: Home / Self Care | Payer: 59 | Source: Ambulatory Visit

## 2016-01-04 DIAGNOSIS — Z1231 Encounter for screening mammogram for malignant neoplasm of breast: Secondary | ICD-10-CM

## 2016-02-02 ENCOUNTER — Encounter: Payer: Self-pay | Admitting: *Deleted

## 2016-02-06 ENCOUNTER — Telehealth: Payer: Self-pay | Admitting: *Deleted

## 2016-02-06 NOTE — Telephone Encounter (Signed)
Gloria Fields states pt is medically cleared to have her foot surgery.  I requested medical clearance note to be faxed to our office, and Sherian Rein stated the doctor who authorized will be back next week, but had sent her a note to okay.  I told Gloria Fields to fax a copy of that note and I would see if it was acceptable to Dr. Milinda Pointer.

## 2016-03-07 ENCOUNTER — Ambulatory Visit
Admission: RE | Admit: 2016-03-07 | Discharge: 2016-03-07 | Disposition: A | Discharge status: Home / Self Care | Payer: Medicare Other | Source: Ambulatory Visit | Attending: Family Medicine | Admitting: Family Medicine

## 2016-03-07 ENCOUNTER — Other Ambulatory Visit: Payer: Self-pay | Admitting: Family Medicine

## 2016-03-07 DIAGNOSIS — M5489 Other dorsalgia: Secondary | ICD-10-CM

## 2016-03-08 ENCOUNTER — Other Ambulatory Visit: Payer: Self-pay | Admitting: Internal Medicine

## 2016-03-08 NOTE — Telephone Encounter (Signed)
May we refill Sir? 

## 2016-03-11 NOTE — Telephone Encounter (Signed)
Spoke with the patient and she said she wanted to get some Lomotil to have if needed.  She's been having 3-4 spells a week of diarrhea.  She still has a few on hand from her colonoscopy but just didn't want to run out.

## 2016-03-11 NOTE — Telephone Encounter (Signed)
Need info as to what is going on - signs/sxs before refilling

## 2016-03-12 NOTE — Telephone Encounter (Signed)
Refill x1 

## 2016-03-14 ENCOUNTER — Other Ambulatory Visit: Payer: Self-pay | Admitting: Podiatry

## 2016-03-14 MED ORDER — OXYCODONE-ACETAMINOPHEN 10-325 MG PO TABS: 1.0000 | ORAL_TABLET | Freq: Four times a day (QID) | ORAL | Status: DC | PRN

## 2016-03-14 MED ORDER — CEPHALEXIN 500 MG PO CAPS: 500.0000 mg | ORAL_CAPSULE | Freq: Three times a day (TID) | ORAL | Status: DC

## 2016-03-14 MED ORDER — ONDANSETRON HCL 4 MG PO TABS: 4.0000 mg | ORAL_TABLET | Freq: Three times a day (TID) | ORAL | Status: DC | PRN

## 2016-03-15 ENCOUNTER — Encounter: Payer: Self-pay | Admitting: Podiatry

## 2016-03-15 DIAGNOSIS — M2012 Hallux valgus (acquired), left foot: Secondary | ICD-10-CM | POA: Diagnosis not present

## 2016-03-18 ENCOUNTER — Telehealth: Payer: Self-pay | Admitting: *Deleted

## 2016-03-18 NOTE — Telephone Encounter (Signed)
Post op courtesy call-Pt states her leg block wore off shortly after getting home and she started taking the pain medication as directed, but it became so painful she had to call the On-Call Doctor.  The On-Call doctor stated take the pain medication every 3-4 hours and RICE.  I asked pt the sensation she was having and she stated aching, throbbing and stinging.  I instructed pt to remove the surgical boot, open-ended sock, and ace wrap, and ice and elevate the foot for 15 minutes, the place the foot level and rewrap ace from the toes up the leg, replace open ended sock and surgical boot.  I told pt, she could only be out of the boot if she was resting and not going to sleep, not to walk with out of the boot or sleep with out the boot, and to only be up on the foot 5 minutes per hour for the 1st week post op and to call with concerns.

## 2016-03-19 NOTE — Progress Notes (Signed)
DOS 03/15/2016 [place] bunion repair with screw left foot.

## 2016-03-21 ENCOUNTER — Ambulatory Visit (INDEPENDENT_AMBULATORY_CARE_PROVIDER_SITE_OTHER): Payer: Medicare Other

## 2016-03-21 ENCOUNTER — Encounter: Payer: Self-pay | Admitting: Podiatry

## 2016-03-21 ENCOUNTER — Ambulatory Visit (INDEPENDENT_AMBULATORY_CARE_PROVIDER_SITE_OTHER): Payer: Medicare Other | Admitting: Podiatry

## 2016-03-21 VITALS — BP 120/59 | HR 96 | Resp 16

## 2016-03-21 DIAGNOSIS — Z9889 Other specified postprocedural states: Secondary | ICD-10-CM

## 2016-03-21 DIAGNOSIS — M2012 Hallux valgus (acquired), left foot: Secondary | ICD-10-CM

## 2016-03-21 NOTE — Progress Notes (Signed)
She presents today 1 week status post Gloria Fields bunionectomy of her left foot. Date of surgery was 03/15/2016. She states that it sore.  Objective: Vital signs are stable she is alert and oriented 3. Dry sterile dressing intact was removed demonstrates moderate edema no cellulitis drainage or odor mild erythema and mild ecchymosis. Good range of motion active and passive first metatarsophalangeal joint. Radiographs confirm well-placed I osteotomy with internal fixation.  Assessment: Well-healing surgical foot left.  Plan: [place] today dry sterile compressive dressing I will follow up with her in 1 week. She is to continue to keep this elevated dry and clean. I will remove sutures next visit and place her in a Darco shoe with a compression anklet.

## 2016-03-25 ENCOUNTER — Telehealth: Payer: Self-pay | Admitting: *Deleted

## 2016-03-25 MED ORDER — OXYCODONE-ACETAMINOPHEN 10-325 MG PO TABS: 1.0000 | ORAL_TABLET | Freq: Four times a day (QID) | ORAL | Status: DC | PRN

## 2016-03-25 NOTE — Telephone Encounter (Signed)
Pt states she getting better every day, but can really tell when she's done too much, and really needs her sleep.  Dr. Marta Antu refill as previously.

## 2016-04-02 ENCOUNTER — Ambulatory Visit (INDEPENDENT_AMBULATORY_CARE_PROVIDER_SITE_OTHER): Payer: Medicare Other | Admitting: Sports Medicine

## 2016-04-02 ENCOUNTER — Encounter: Payer: Self-pay | Admitting: Sports Medicine

## 2016-04-02 DIAGNOSIS — Z9889 Other specified postprocedural states: Secondary | ICD-10-CM | POA: Diagnosis not present

## 2016-04-02 DIAGNOSIS — M79672 Pain in left foot: Secondary | ICD-10-CM | POA: Diagnosis not present

## 2016-04-02 NOTE — Progress Notes (Signed)
Patient ID: Gloria Fields, female   DOB: Sep 23, 1946, 70 y.o.   MRN: [id number] Subjective: Gloria Fields is a 70 y.o. female patient seen today in office for POV #2 DOS 03-15-16 s/p [place] bunionectomy left. Patient denies pain at surgical site, denies calf pain, denies headache, chest pain, shortness of breath, nausea, vomiting, fever, or chills. Patient states that she is doing well and is only taking Ibuprofen as needed. No other issues noted.   Patient Active Problem List   Diagnosis Date Noted  . Ventral hernia 10/12/2015  . Endometrial stripe increased 07/28/2015  . Ovarian cancer screening 07/19/2015  . Ovarian cystic mass - left 07/07/2015  . Colon cancer screening   . Stenosis of colon ([place])   . Diverticulosis of colon without hemorrhage   . Pyloric stenosis, acquired   . 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 ([place]) 12/24/2013  . Anemia, normocytic normochromic 12/25/2012  . Nausea with vomiting 08/28/2011  . Esophageal reflux 08/28/2011  . Pyloric channel ulcer 08/28/2011  . GERD (gastroesophageal reflux disease) 08/15/2011  . Depression, major, recurrent ([place]) 08/15/2011  . Anxiety disorder 08/15/2011  . Diverticulosis of colon (without mention of hemorrhage) 08/15/2011    Current Outpatient Prescriptions on File Prior to Visit  Medication Sig Dispense Refill  . amphetamine-dextroamphetamine (ADDERALL) 20 MG tablet Take 20 mg by mouth 2 (two) times daily. Takes at 7am and noon  0  . antiseptic oral rinse (BIOTENE) LIQD 15 mLs by Mouth Rinse route as needed for dry mouth (uses when she brushes her teeth).    Marland Kitchen b complex vitamins tablet Take 1 tablet by mouth daily.     Ileene Patrick Products (PERIDRIN-C) 200-50-150 MG TABS Take 1 capsule by mouth daily.     . Calcium Carb-Cholecalciferol (CALCIUM 600 + D PO) Take 1 tablet by mouth daily.    . cephALEXin (KEFLEX) 500 MG capsule Take 1 capsule (500 mg total) by mouth 3 (three) times daily. 30 capsule  0  . cholecalciferol (VITAMIN D) 1000 UNITS tablet Take 1,000 Units by mouth daily.    . clonazePAM (KLONOPIN) 1 MG tablet Take 1 mg by mouth 3 (three) times daily as needed for anxiety. For anxiety    . dexlansoprazole (DEXILANT) 60 MG capsule Take 1 capsule (60 mg total) by mouth daily. 30 capsule 6  . diphenoxylate-atropine (LOMOTIL) 2.5-0.025 MG tablet TAKE 1 TABLET BY MOUTH 4 TIMES DAILY AS NEEDED FOR DIARRHEA OR LOOSE STOOLS. 60 tablet 0  . Eszopiclone (ESZOPICLONE) 3 MG TABS Take 3 mg by mouth at bedtime. Take immediately before bedtime    . ferrous sulfate 325 (65 FE) MG tablet Take 325 mg by mouth daily with breakfast.    . gabapentin (NEURONTIN) 100 MG capsule Take 100 mg by mouth 3 (three) times daily.  1  . hydroxypropyl methylcellulose / hypromellose (ISOPTO TEARS / GONIOVISC) 2.5 % ophthalmic solution Place 1 drop into both eyes 3 (three) times daily as needed for dry eyes.    . Multiple Vitamins-Minerals (CENTRUM SILVER PO) Take 1 capsule by mouth daily.     . ondansetron (ZOFRAN) 4 MG tablet Take 1 tablet (4 mg total) by mouth every 8 (eight) hours as needed for nausea or vomiting. 20 tablet 0  . oxyCODONE-acetaminophen (PERCOCET) 10-325 MG tablet Take 1 tablet by mouth every 6 (six) hours as needed for pain. 40 tablet 0  . PREVIDENT 5000 DRY MOUTH 1.1 % GEL dental gel     . sertraline (ZOLOFT) 25  MG tablet Take 50 mg by mouth daily.     . valsartan-hydrochlorothiazide (DIOVAN HCT) 160-25 MG per tablet Take 1 tablet by mouth every morning.     . vitamin E 100 UNIT capsule Take 200 Units by mouth daily.      No current facility-administered medications on file prior to visit.    Allergies  Allergen Reactions  . Morphine Other (See Comments)    REACTION: paranoid    Objective: There were no vitals filed for this visit.  General: No acute distress, AAOx3   Left foot: Sutures intact with no gapping or dehiscence at surgical site, mild swelling to left forefoot, no erythema,  no warmth, no drainage, no signs of infection noted, Capillary fill time <3 seconds in all digits, gross sensation present via light touch to left foot. No pain or crepitation with range of motion left foot.  No pain with calf compression.   Assessment and Plan:  Problem List Items Addressed This Visit    None    Visit Diagnoses    Status post left foot surgery    -  Primary    DOS 03-15-16 [place] bunionectomy left    Foot pain, left           -Patient seen and evaluated -Sutures removed and compression anklet dispensed and placed on left foot -Patient may shower or bathe as normal  -Patient to transition from CAM boot to post op shoe on left; Dispensed post op shoe and educated on proper use -Advised patient to limit activity to tolerance  -Advised patient to ice and elevate as necessary and continue with motrin as needed  -Patient to follow up with Dr.Hyatt in 2 weeks for follow up post op care. In the meantime, patient to call office if any issues or problems arise.   Landis Martins, DPM

## 2016-04-16 ENCOUNTER — Ambulatory Visit (INDEPENDENT_AMBULATORY_CARE_PROVIDER_SITE_OTHER): Payer: Medicare Other

## 2016-04-16 ENCOUNTER — Ambulatory Visit (INDEPENDENT_AMBULATORY_CARE_PROVIDER_SITE_OTHER): Payer: Medicare Other | Admitting: Podiatry

## 2016-04-16 ENCOUNTER — Encounter: Payer: Self-pay | Admitting: Podiatry

## 2016-04-16 VITALS — BP 85/51 | HR 104 | Resp 16

## 2016-04-16 DIAGNOSIS — M2012 Hallux valgus (acquired), left foot: Secondary | ICD-10-CM

## 2016-04-16 DIAGNOSIS — Z9889 Other specified postprocedural states: Secondary | ICD-10-CM

## 2016-04-16 MED ORDER — OXYCODONE-ACETAMINOPHEN 10-325 MG PO TABS: 1.0000 | ORAL_TABLET | Freq: Four times a day (QID) | ORAL | Status: DC | PRN

## 2016-04-16 NOTE — Progress Notes (Signed)
She presents today for follow-up across and bunion repair left foot date of surgery 03/15/2016. She states that she is doing very well.  Objective: Minimal edema no erythema cellulitis drainage or odor. She's great range of motion of the first metatarsophalangeal joint left foot. Radiographs confirm K wires are in place and the osteotomy appears to be healing uneventfully.  Assessment: Well-healing surgical foot left.  Plan: Continue range of motion exercises trying to get into a soft loosefitting shoe and I will follow-up with her in 1 month.

## 2016-04-17 IMAGING — CT CT ABD-PELV W/O CM
3 of 4 series · 12 of 36 positions shown, 18 images · IV contrast (READICAT/WATER)
Comparison: 05/23/2011.

CLINICAL DATA: Initial encounter for right upper quadrant and
left-sided abdominal pain for 2 weeks with diarrhea.

EXAM:
CT ABDOMEN AND PELVIS WITHOUT CONTRAST
TECHNIQUE: Multidetector CT imaging of the abdomen and pelvis was performed
following the standard protocol without IV contrast.

[Series 3: abd/pelvis w/o · axial · non-contrast · 0.70mm/px · z∈[-340,-36]mm · 8 of 79 slices shown, 13 images]
[im 9/79  soft-tissue]
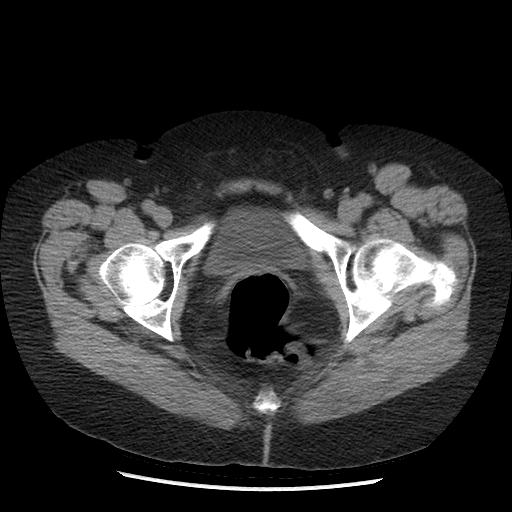
[im 9/79  bone]
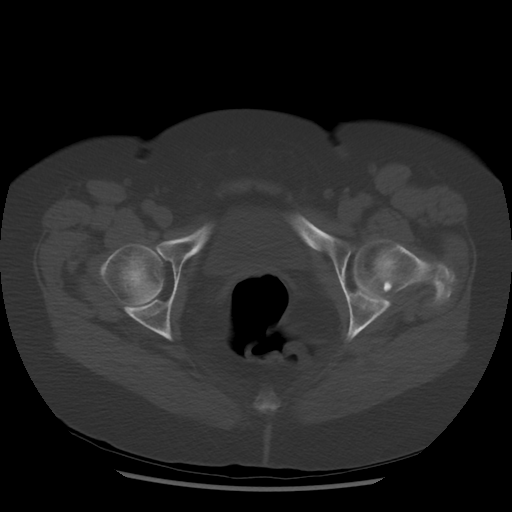
[im 18/79  soft-tissue]
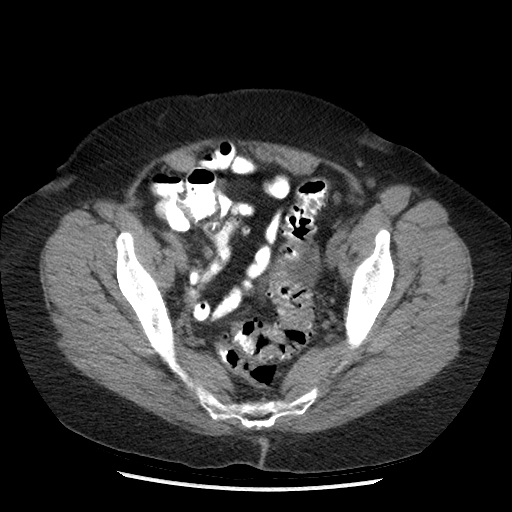
[im 27/79  soft-tissue]
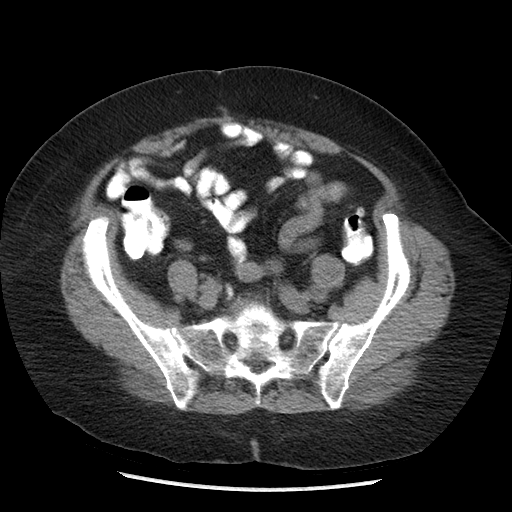
[im 35/79  soft-tissue]
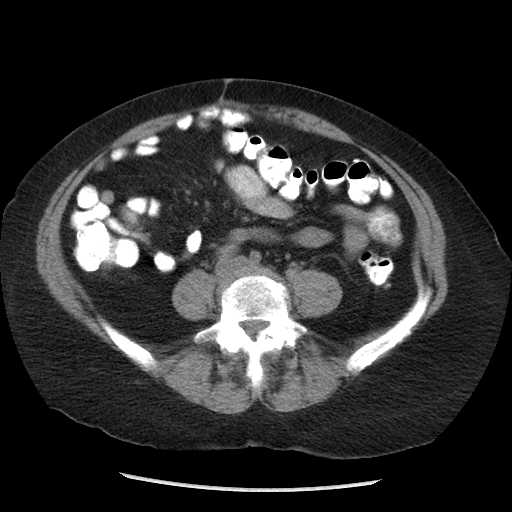
[im 44/79  soft-tissue]
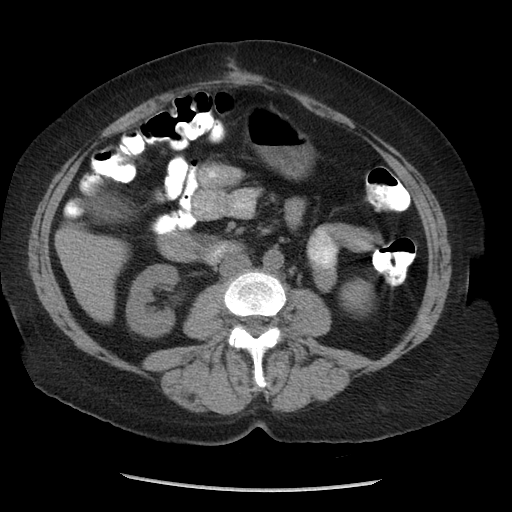
[im 44/79  lung]
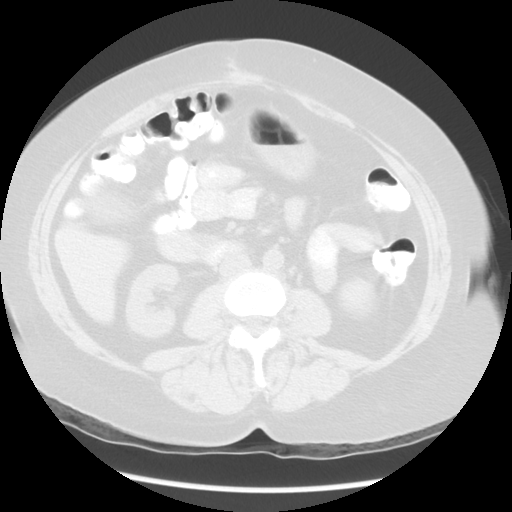
[im 53/79  soft-tissue]
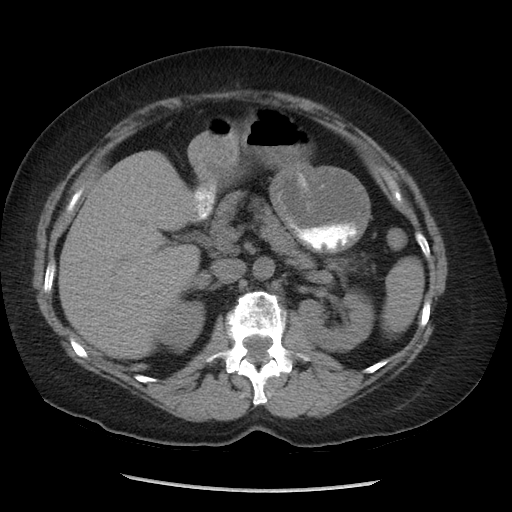
[im 53/79  lung]
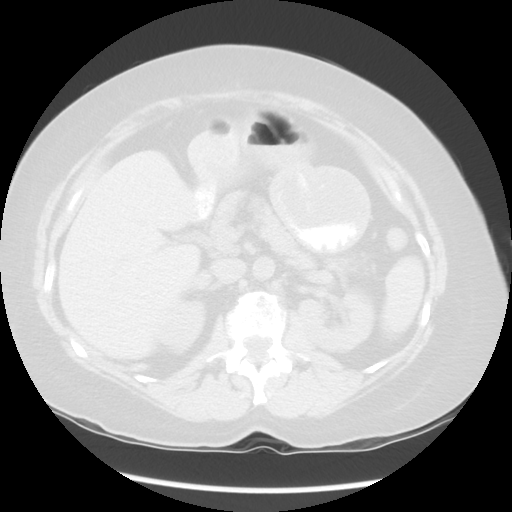
[im 61/79  soft-tissue]
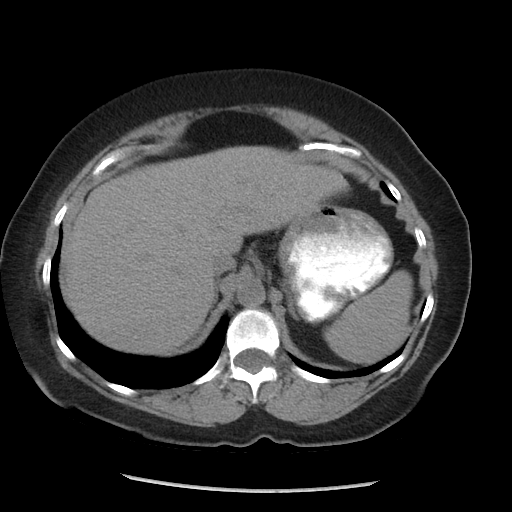
[im 61/79  lung]
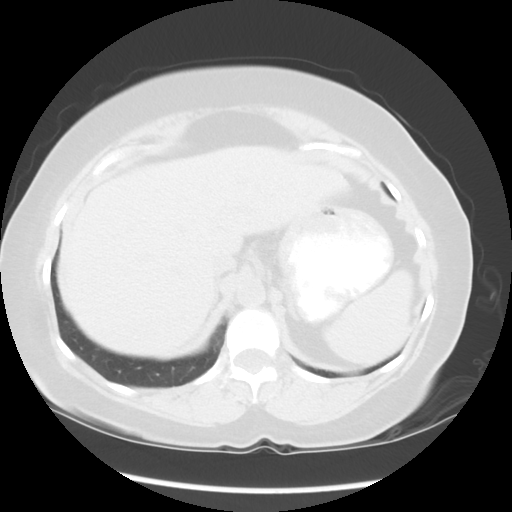
[im 70/79  soft-tissue]
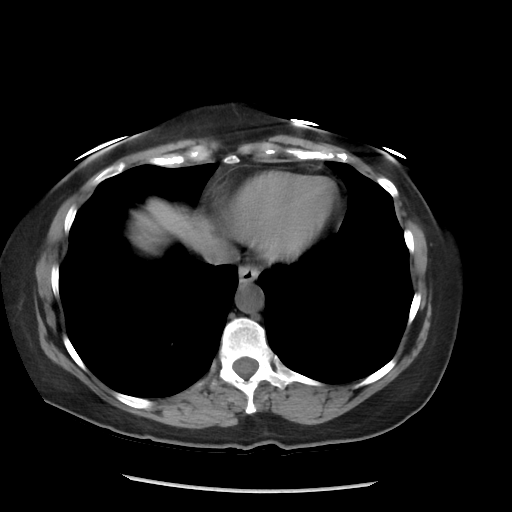
[im 70/79  lung]
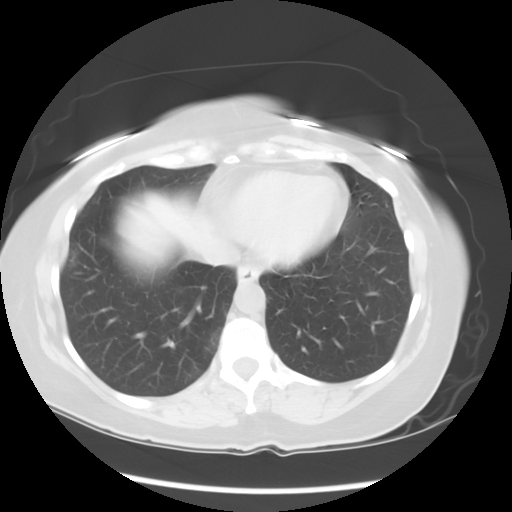

[Series 601: coronal body · coronal · 0.86mm/px · 1 of 126 slices shown, 2 images]
[im 42/126  soft-tissue]
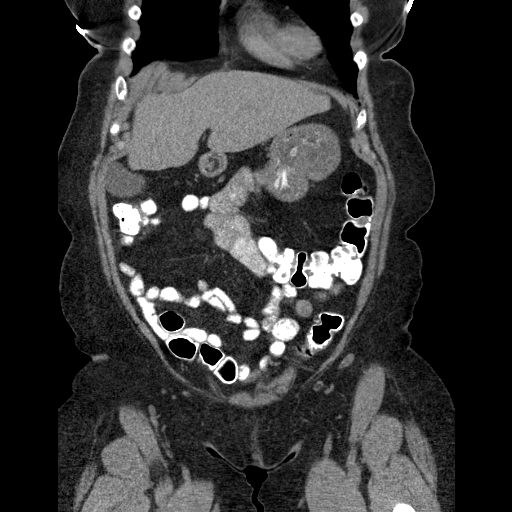
[im 42/126  bone]
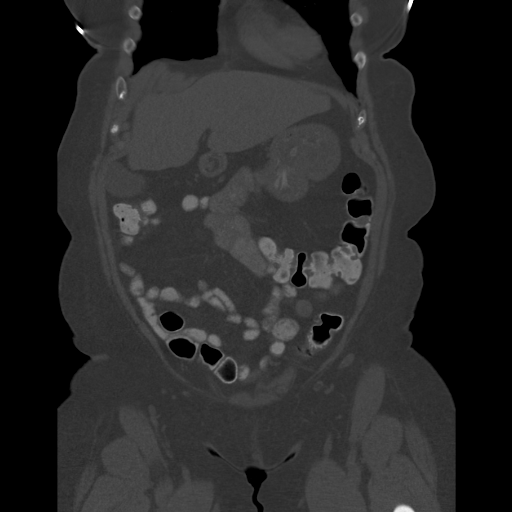

[Series 602: sagittal body · sagittal · 0.86mm/px · 3 of 145 slices shown]
[im 17/145  soft-tissue]
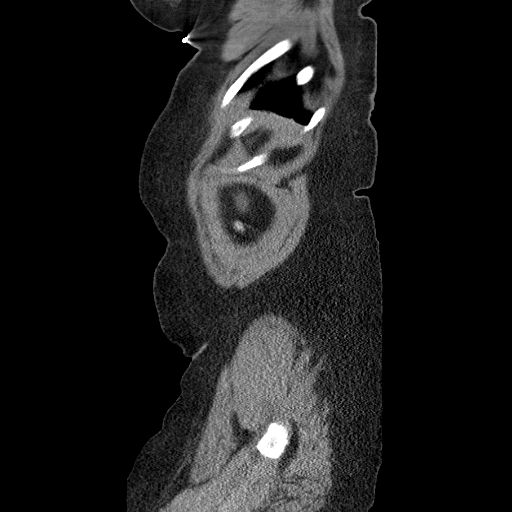
[im 34/145  soft-tissue]
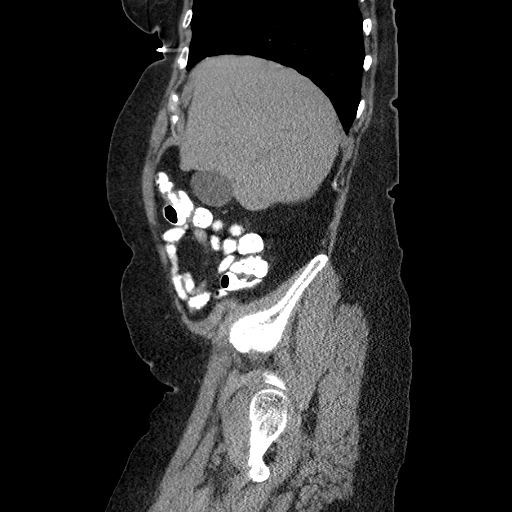
[im 51/145  soft-tissue]
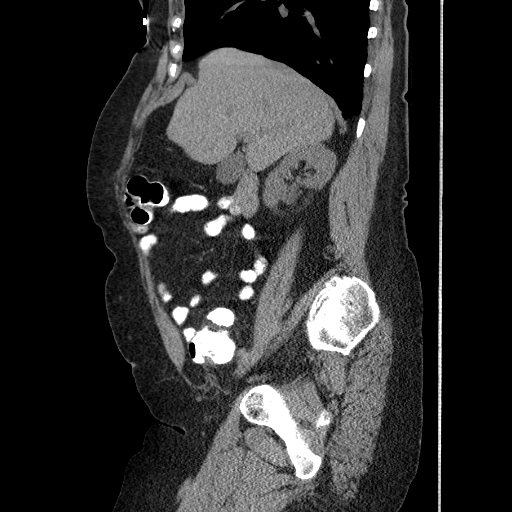

[12 of 36 positions shown; findings below may reference images not displayed]

FINDINGS: Lower chest:  Unremarkable.

Hepatobiliary: No focal abnormality in the liver on this study
without intravenous contrast. No evidence of hepatomegaly. There is
no evidence for gallstones, gallbladder wall thickening, or
pericholecystic fluid. No intrahepatic or extrahepatic biliary
dilation.

Pancreas: No focal mass lesion. No dilatation of the main duct. No
intraparenchymal cyst. No peripancreatic edema.

Spleen: No splenomegaly. No focal mass lesion.

Adrenals/Urinary Tract: No adrenal nodule or mass. No stones are
seen in either kidney. No mass lesion is evident in either kidney on
this scan performed without intravenous contrast material. No
evidence for hydroureter. The urinary bladder appears normal for the
degree of distention.

Stomach/Bowel: Stomach is nondistended. No gastric wall thickening.
No evidence of outlet obstruction. Duodenum is normally positioned
as is the ligament of Treitz. No small bowel wall thickening. No
small bowel dilatation. The terminal ileum is normal. The appendix
is not visualized, but there is no edema or inflammation in the
region of the cecum. Diverticular changes are noted in the left
colon without evidence of diverticulitis.

Vascular/Lymphatic: No abdominal aortic aneurysm. There is no
gastrohepatic or hepatoduodenal ligament lymphadenopathy. No
intraperitoneal or retroperitoneal lymphadenopy. No mesenteric or
pelvic sidewall lymphadenopathy.

Reproductive: Uterus is unremarkable. No right adnexal mass. 6.0 x
4.6 cm cystic mass in the left adnexal space is new in the interval.

Other: No intraperitoneal free fluid.

Musculoskeletal: Small sclerotic lesions in the left femoral neck is
unchanged in the interval. A 5 mm sclerotic focus in the posterior
left femoral head was 3 mm previously. No worrisome lytic or
sclerotic osseous abnormality is evident.
IMPRESSION: 1. There is a new 6 x 4.6 cm cystic mass in the left adnexal space.
Pelvic ultrasound recommended to further evaluate.
2. Probable bone island in the left femoral neck with slight
increase in a sclerotic focus in the left femoral head, also likely
benign.
These results will be called to the ordering clinician or
representative by the Radiologist Assistant, and communication
documented in the PACS or zVision Dashboard.

## 2016-04-19 IMAGING — US US TRANSVAGINAL NON-OB
1 series · 13 of 25 positions shown · non-contrast
Comparison: CT scan of July 03, 2015.

CLINICAL DATA: Left-sided pelvic pain.

EXAM:
TRANSABDOMINAL AND TRANSVAGINAL ULTRASOUND OF PELVIS
TECHNIQUE: Both transabdominal and transvaginal ultrasound examinations of the
pelvis were performed. Transabdominal technique was performed for
global imaging of the pelvis including uterus, ovaries, adnexal
regions, and pelvic cul-de-sac. It was necessary to proceed with
endovaginal exam following the transabdominal exam to visualize the
endometrium and ovaries.

[Series 1: us transvaginal non-ob · 0.13mm/px · 13 of 57 slices shown]
[im 1/57]
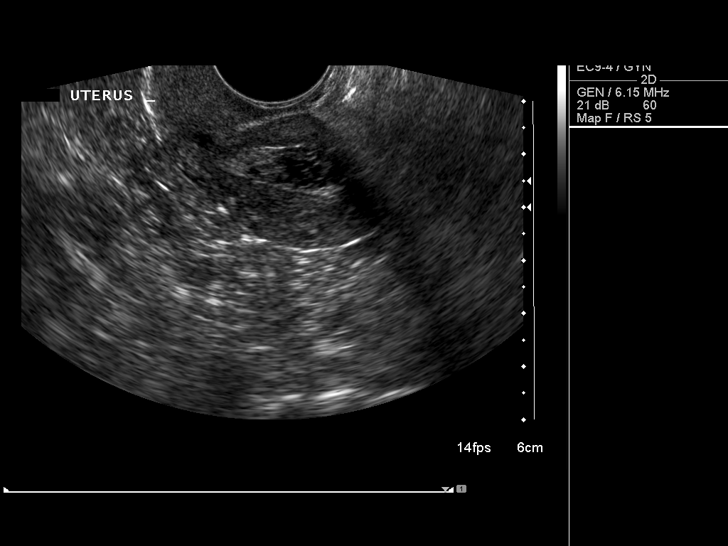
[im 5/57]
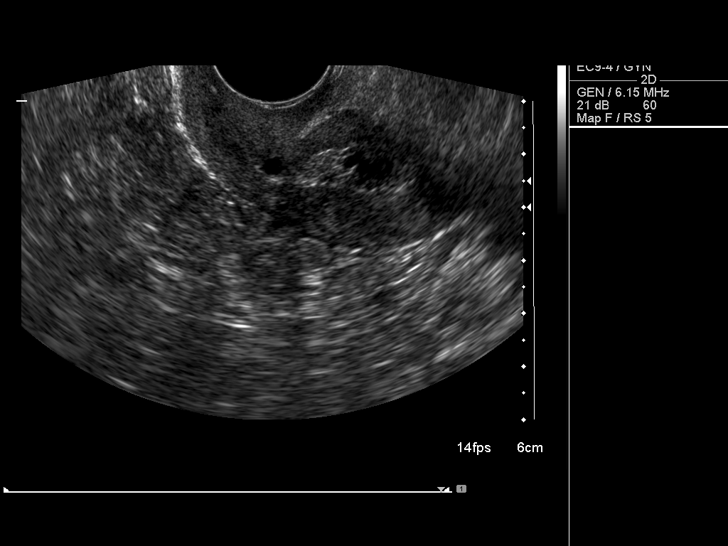
[im 10/57]
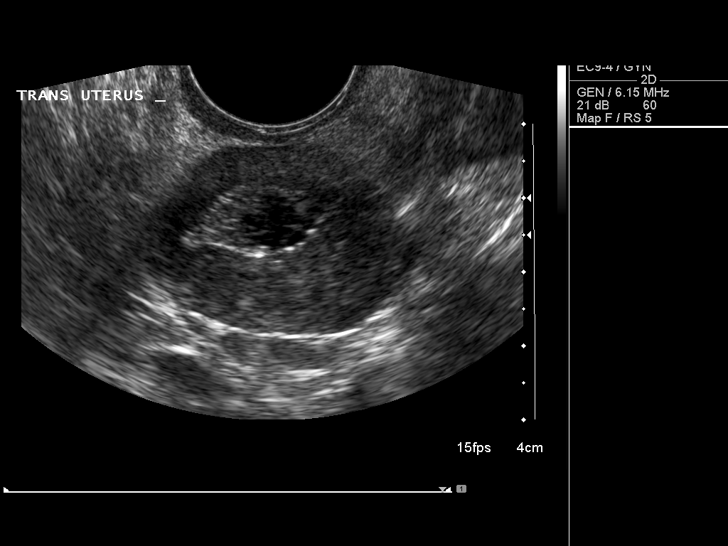
[im 15/57]
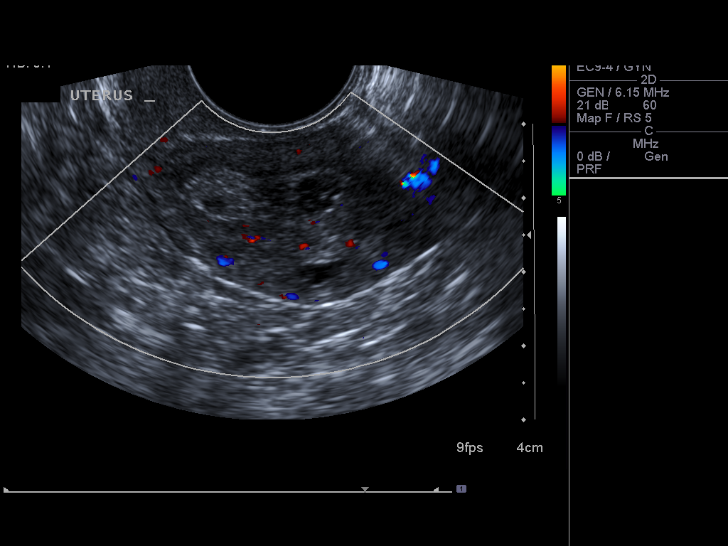
[im 19/57]
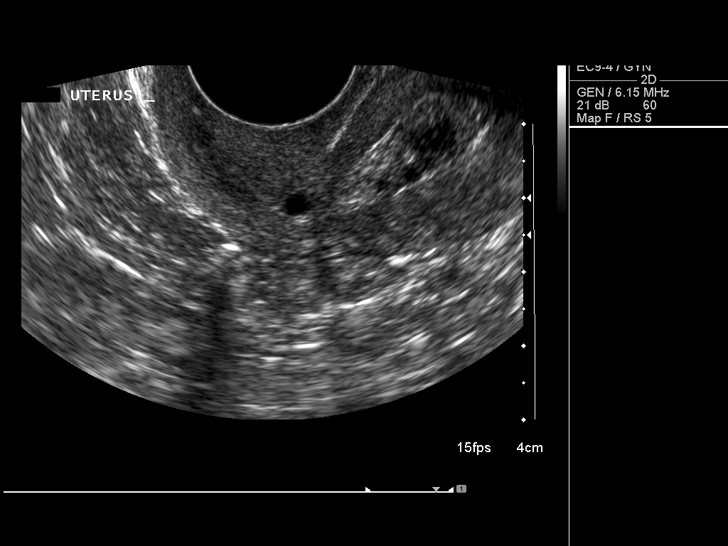
[im 24/57]
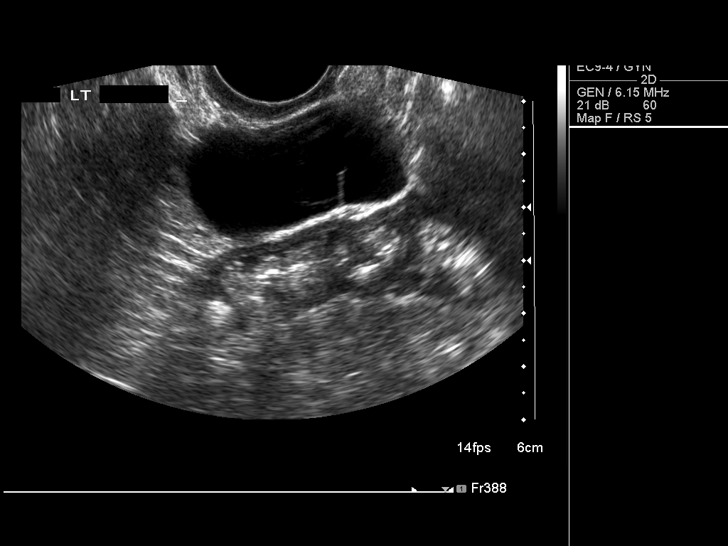
[im 29/57]
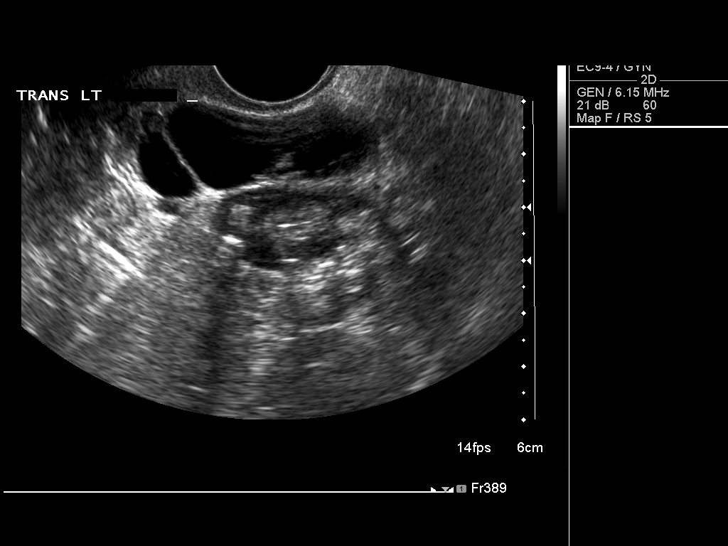
[im 33/57]
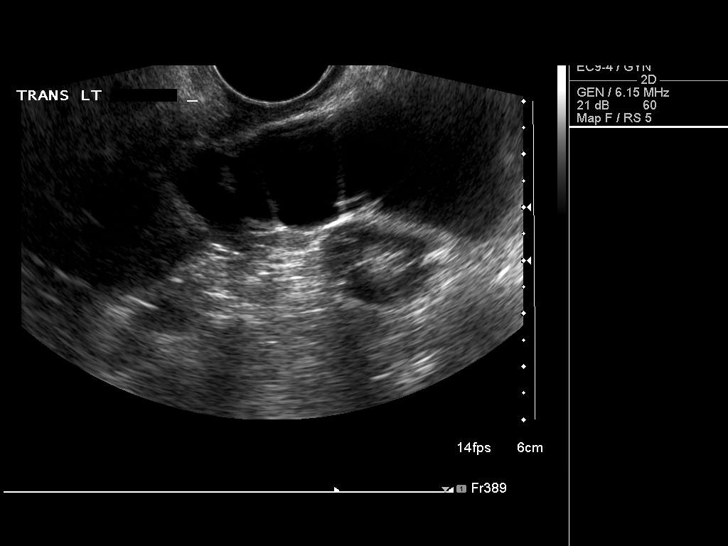
[im 38/57]
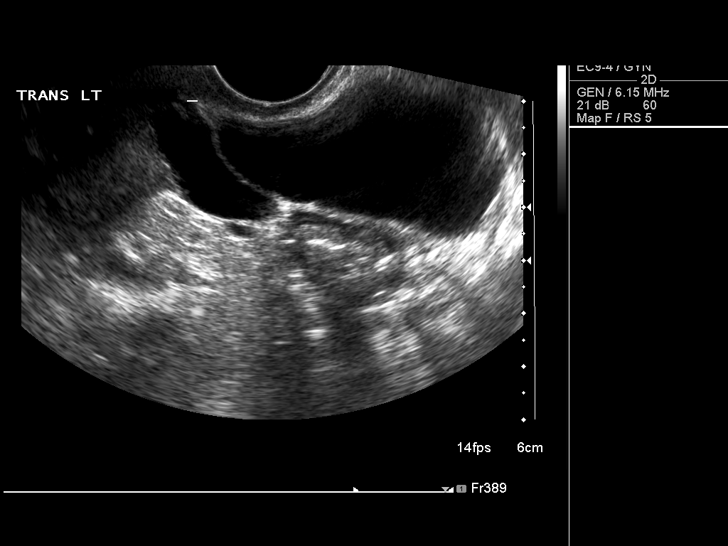
[im 43/57]
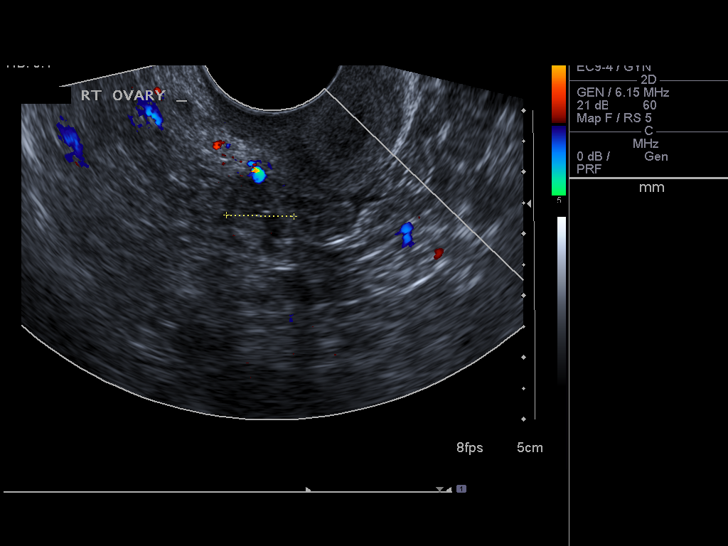
[im 47/57]
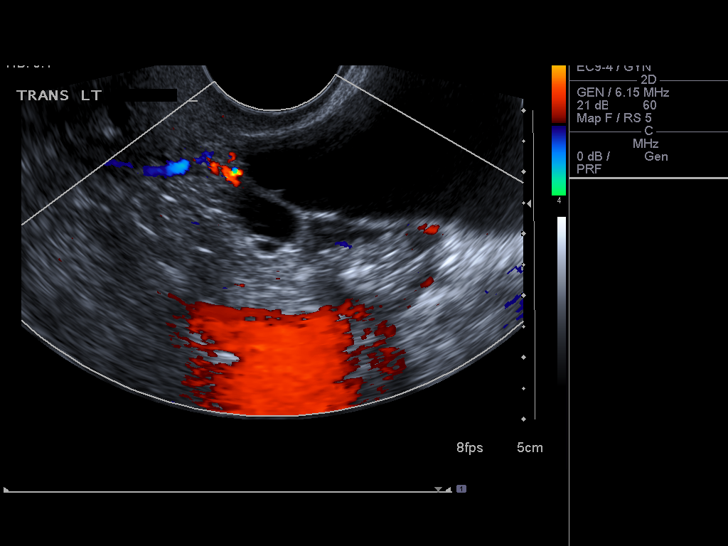
[im 52/57]
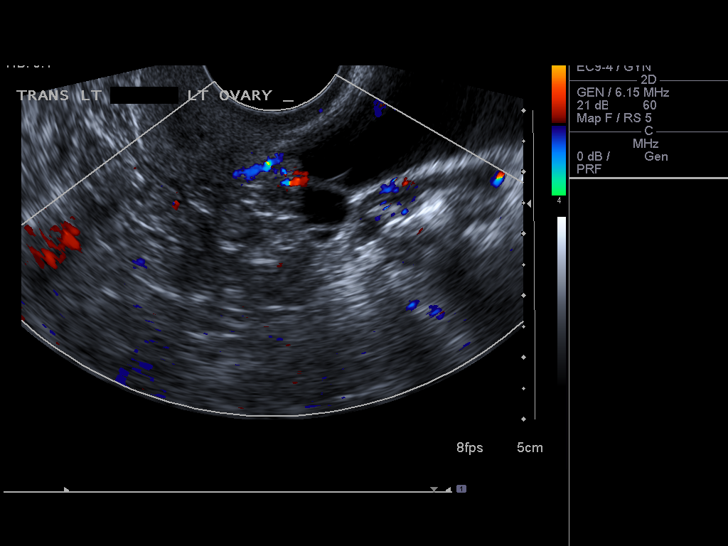
[im 57/57]
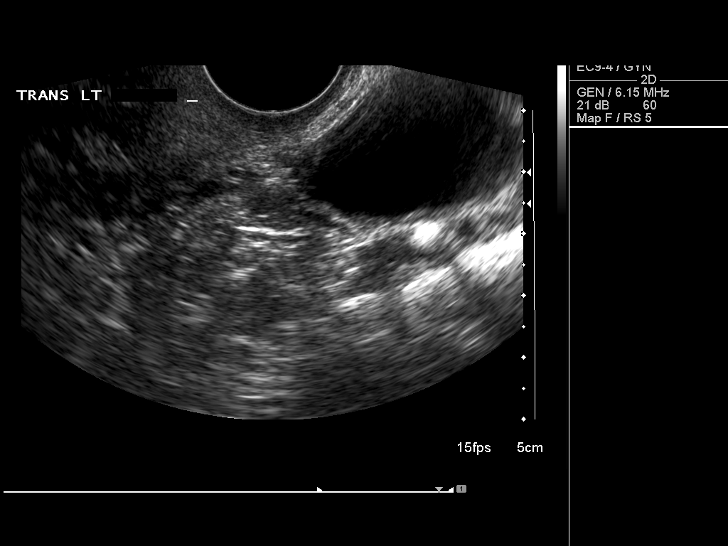

[13 of 25 positions shown; findings below may reference images not displayed]

FINDINGS: Uterus

Measurements: 5.8 x 3.7 x 3.2 cm. It is retroverted. No fibroids or
other mass visualized.

Endometrium

Thickness: 10 mm which is abnormally thickened for postmenopausal
patient. No focal abnormality visualized.

Right ovary

Measurements: 1.7 x 1.1 x 0.9 cm. Normal appearance/no adnexal mass.

Left ovary

Measurements: 1.3 x 1.2 x 0.9 cm. Normal appearance/no adnexal mass.

Other findings

No free fluid. Complex but predominantly cystic left adnexal mass is
noted which contains multiple septa ; most of the septa appear to be
thin, although several appear to be slightly more thickened and
irregular. It measures 6.5 x 5.0 x 3.4 cm.
IMPRESSION: Endometrial thickness is measured at 10 mm. Endometrial thickness is
considered abnormal for an asymptomatic post-menopausal female.
Endometrial sampling should be considered to exclude carcinoma.

6.5 cm complex multi-septated left adnexal mass is noted.
Gynecological surgical consultation is recommended.

## 2016-05-14 ENCOUNTER — Ambulatory Visit (INDEPENDENT_AMBULATORY_CARE_PROVIDER_SITE_OTHER): Payer: Medicare Other

## 2016-05-14 ENCOUNTER — Encounter: Payer: Medicare Other | Admitting: Podiatry

## 2016-05-14 DIAGNOSIS — M2012 Hallux valgus (acquired), left foot: Secondary | ICD-10-CM | POA: Diagnosis not present

## 2016-05-14 DIAGNOSIS — Z9889 Other specified postprocedural states: Secondary | ICD-10-CM

## 2016-05-16 ENCOUNTER — Ambulatory Visit (INDEPENDENT_AMBULATORY_CARE_PROVIDER_SITE_OTHER): Payer: Medicare Other

## 2016-05-16 ENCOUNTER — Encounter: Payer: Self-pay | Admitting: Podiatry

## 2016-05-16 ENCOUNTER — Ambulatory Visit (INDEPENDENT_AMBULATORY_CARE_PROVIDER_SITE_OTHER): Payer: Medicare Other | Admitting: Podiatry

## 2016-05-16 VITALS — BP 109/64 | HR 87 | Resp 12

## 2016-05-16 DIAGNOSIS — M79672 Pain in left foot: Secondary | ICD-10-CM

## 2016-05-16 DIAGNOSIS — M2012 Hallux valgus (acquired), left foot: Secondary | ICD-10-CM

## 2016-05-16 NOTE — Progress Notes (Signed)
She presents today for her 2 month postop visit she is status post [place] bunion repair left foot date of surgery 03/15/2016. She states that on doing fine most days at all even realized that I had surgery done.  Objective: Vital signs are stable alert and oriented 3 limited range of motion first metatarsophalangeal joint of the left foot with strong palpable pulses. Radiograph confirms double K wire fixation and well-healed osteotomy. No fractures identified.  Assessment: Well-healing surgical foot is slight limitation on range of motion.  Plan: I encouraged her to increase her range of motion and physical therapy activities I will follow-up with her in 1 month for her final postop. She will call sooner if needed.

## 2016-06-19 ENCOUNTER — Encounter: Payer: Self-pay | Admitting: *Deleted

## 2016-06-19 ENCOUNTER — Ambulatory Visit (HOSPITAL_BASED_OUTPATIENT_CLINIC_OR_DEPARTMENT_OTHER): Payer: Medicare Other | Admitting: Oncology

## 2016-06-19 ENCOUNTER — Other Ambulatory Visit (HOSPITAL_BASED_OUTPATIENT_CLINIC_OR_DEPARTMENT_OTHER): Payer: Medicare Other

## 2016-06-19 ENCOUNTER — Telehealth: Payer: Self-pay | Admitting: Oncology

## 2016-06-19 VITALS — BP 145/67 | HR 73 | Temp 98.3°F | Resp 20 | Ht 59.0 in | Wt 156.9 lb

## 2016-06-19 DIAGNOSIS — D500 Iron deficiency anemia secondary to blood loss (chronic): Secondary | ICD-10-CM

## 2016-06-19 DIAGNOSIS — Z853 Personal history of malignant neoplasm of breast: Secondary | ICD-10-CM

## 2016-06-19 DIAGNOSIS — D649 Anemia, unspecified: Secondary | ICD-10-CM | POA: Diagnosis not present

## 2016-06-19 DIAGNOSIS — C50519 Malignant neoplasm of lower-outer quadrant of unspecified female breast: Secondary | ICD-10-CM

## 2016-06-19 LAB — CBC WITH DIFFERENTIAL/PLATELET
BASO%: 0.3 % (ref 0.0–2.0)
Basophils Absolute: 0 10*3/uL (ref 0.0–0.1)
EOS%: 4.2 % (ref 0.0–7.0)
Eosinophils Absolute: 0.3 10*3/uL (ref 0.0–0.5)
HCT: 30.5 % — ABNORMAL LOW (ref 34.8–46.6)
HGB: 10.2 g/dL — ABNORMAL LOW (ref 11.6–15.9)
LYMPH%: 26.5 % (ref 14.0–49.7)
MCH: 28.6 pg (ref 25.1–34.0)
MCHC: 33.4 g/dL (ref 31.5–36.0)
MCV: 85.4 fL (ref 79.5–101.0)
MONO#: 0.6 10*3/uL (ref 0.1–0.9)
MONO%: 8.5 % (ref 0.0–14.0)
NEUT#: 4.4 10*3/uL (ref 1.5–6.5)
NEUT%: 60.5 % (ref 38.4–76.8)
Platelets: 194 10*3/uL (ref 145–400)
RBC: 3.57 10*6/uL — ABNORMAL LOW (ref 3.70–5.45)
RDW: 13.3 % (ref 11.2–14.5)
WBC: 7.2 10*3/uL (ref 3.9–10.3)
lymph#: 1.9 10*3/uL (ref 0.9–3.3)

## 2016-06-19 LAB — FERRITIN: Ferritin: 207 ng/ml (ref 9–269)

## 2016-06-19 LAB — IRON AND TIBC
%SAT: 30 % (ref 21–57)
Iron: 77 ug/dL (ref 41–142)
TIBC: 253 ug/dL (ref 236–444)
UIBC: 176 ug/dL (ref 120–384)

## 2016-06-19 NOTE — Addendum Note (Signed)
Addended by: Randolm Idol on: 06/19/2016 10:23 AM   Modules accepted: Orders

## 2016-06-19 NOTE — Progress Notes (Signed)
Hematology and Oncology Follow Up Visit  Gloria Fields [id number] [birth year] 70 y.o. 06/19/2016 9:16 AM   Principle Diagnosis: 70 year old woman diagnosed with breast cancer in 2007. 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 She also has chronic mild anemia.  She was found to have a ovarian mass on the left side measuring 6 cm in August 2016. The final pathology confirmed an ovarian cyst without malignancy.  Prior therapy:   She underwent lumpectomy 11/04/2006. Estrogen receptor 99% positive, progesterone receptor 74%, Ki67 proliferation marker 6%, HER-2/neu by fish no amplification. She received breast radiation followed by 5 years of Arimidex. She has been in remission since her surgery.  She is status post laparoscopic left salpingo-oophorectomy and lysis of adhesion as well as hernia repair on 10/12/2015. The final pathology from her ovarian mass was benign consistent with ovarian cyst.  Interim History:   Gloria Fields presents today for a follow-up visit. Since the last visit, she reports no changes in her health. She continues to live independently and attends to activities of daily living. 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  She reports no abdominal pain or distention. Has not reported any GYN bleeding. She does not report any breast masses or tenderness. She does not report any excessive fatigue or tiredness. She denied any hematochezia or melena.   She denies any headache or change in vision. She does not report any fevers or chills or sweats. He does not report any chest pain, palpitation orthopnea. She does not report any nausea, vomiting, abdominal pain. She denies any vaginal bleeding or discharge. She does not report any skeletal complaints of arthralgias or myalgias. She does not report any cough or hemoptysis. Rest of  review of systems unremarkable.   Medications: reviewed  Allergies:  Allergies  Allergen Reactions  . Morphine Other (See Comments)    REACTION: paranoid     Physical Exam: Blood pressure (!) 145/67, pulse 73, temperature 98.3 F (36.8 C), temperature source Oral, resp. rate 20, height '4\' 11"'  (1.499 m), weight 156 lb 14.4 oz (71.2 kg), SpO2 100 %. Wt Readings from Last 3 Encounters:  06/19/16 156 lb 14.4 oz (71.2 kg)  12/21/15 169 lb 9.6 oz (76.9 kg)  12/19/15 169 lb (76.7 kg)     ECOG 0 General appearance: Well-appearing woman without distress.Marland Kitchen HEENT: No oral ulcers or lesions. No thrush noted. Lymph nodes: No cervical, supraclavicular, or axillary lymphadenopathy Lungs: Clear to auscultation, resonant to percussion throughout no dullness to percussion. Heart: Regular rhythm, no murmur, no gallop, no rub, no click, no edema Abdomen: Soft, nontender, normal bowel sounds, no mass, no organomegaly. No rebound or guarding. Extremities: No edema, no clubbing or cyanosis. Musculoskeletal: no joint deformities Neurologic: Alert, oriented, no deficits noted. Skin: No rash or ecchymosis  Lab Results: CBC W/Diff    Component Value Date/Time   WBC 7.2 06/19/2016 0848   WBC 9.6 10/13/2015 0440   RBC 3.57 (L) 06/19/2016 0848   RBC 3.13 (L) 10/13/2015 0440   HGB 10.2 (L) 06/19/2016 0848   HCT 30.5 (L) 06/19/2016 0848   PLT 194 06/19/2016 0848   MCV 85.4 06/19/2016 0848   MCH 28.6 06/19/2016 0848   MCH 30.4 10/13/2015 0440   MCHC 33.4 06/19/2016 0848   MCHC 32.9 10/13/2015 0440   RDW 13.3 06/19/2016 0848   LYMPHSABS 1.9 06/19/2016 0848  MONOABS 0.6 06/19/2016 0848   EOSABS 0.3 06/19/2016 0848   BASOSABS 0.0 06/19/2016 0848     Chemistry      Component Value Date/Time   NA 139 12/21/2015 1255   K 4.0 12/21/2015 1255   CL 105 10/13/2015 0440   CO2 24 12/21/2015 1255   BUN 25.8 12/21/2015 1255   CREATININE 1.1 12/21/2015 1255      Component Value Date/Time    CALCIUM 9.6 12/21/2015 1255   ALKPHOS 77 12/21/2015 1255   AST 18 12/21/2015 1255   ALT 16 12/21/2015 1255   BILITOT <0.30 12/21/2015 1255       Impression: 70 year old woman with the following issues  1. Stage II 2.6 cm, ER/PR positive HER-2 negative cancer right breast diagnosed in 2007. She has no evidence of disease at this time and she is up to date on her mammography. Her next mammogram scheduled for January 2018.  2. 6.5 cm complex left adnexal mass : She is status post laparoscopic oophorectomy with a pathology revealed ovarian cyst completed in November 2016.  3. Anemia: Her hemoglobin is slightly lower and iron studies are currently pending. We will continue to monitor this and supplement intravenous iron if she is intolerant to oral iron.  4. Follow-up: Will be in 6 months.      [facility], MD 7/26/20179:16 AM

## 2016-06-19 NOTE — Telephone Encounter (Signed)
Gave pt cal & avs °

## 2016-06-19 NOTE — Addendum Note (Signed)
Addended by: Randolm Idol on: 06/19/2016 09:53 AM   Modules accepted: Orders

## 2016-06-20 ENCOUNTER — Encounter: Payer: Self-pay | Admitting: Podiatry

## 2016-06-20 ENCOUNTER — Ambulatory Visit (INDEPENDENT_AMBULATORY_CARE_PROVIDER_SITE_OTHER): Payer: Medicare Other

## 2016-06-20 ENCOUNTER — Ambulatory Visit (INDEPENDENT_AMBULATORY_CARE_PROVIDER_SITE_OTHER): Payer: Medicare Other | Admitting: Podiatry

## 2016-06-20 DIAGNOSIS — M2012 Hallux valgus (acquired), left foot: Secondary | ICD-10-CM | POA: Diagnosis not present

## 2016-06-20 DIAGNOSIS — Z9889 Other specified postprocedural states: Secondary | ICD-10-CM

## 2016-06-20 NOTE — Progress Notes (Signed)
She presents today for her final postop visit date of surgery was 03/15/2016 for an [facility] bunion repair of the left foot with pins. She states that she's doing just great and have absolutely no pain whatsoever. It looks so much better than it did prior to surgery.  Objective: Vital signs are stable she is alert and oriented 3. Presents today with a pair of sandals ambulating normally. No erythema edema cellulitis drainage or odor pulses remain palpable. Radiographs taken today do demonstrate well-healing surgical osteotomy first metatarsal without complications.  Assessment: Well-healing surgical foot left.  Plan: Follow up with me as needed. She may return her regular activities.

## 2016-10-05 NOTE — Progress Notes (Signed)
This encounter was created in error - please disregard.

## 2016-12-19 ENCOUNTER — Telehealth: Payer: Self-pay | Admitting: Oncology

## 2016-12-19 ENCOUNTER — Other Ambulatory Visit: Payer: Self-pay | Admitting: Physician Assistant

## 2016-12-19 DIAGNOSIS — Z1231 Encounter for screening mammogram for malignant neoplasm of breast: Secondary | ICD-10-CM

## 2016-12-19 DIAGNOSIS — Z853 Personal history of malignant neoplasm of breast: Secondary | ICD-10-CM

## 2016-12-19 NOTE — Telephone Encounter (Signed)
Pt called to r/s appt to 2/21 at 830 am due to wanting MD to have mammo results. Gave pt new appt date/time per request

## 2016-12-20 ENCOUNTER — Ambulatory Visit: Payer: Medicare Other | Admitting: Oncology

## 2016-12-20 ENCOUNTER — Other Ambulatory Visit: Payer: Medicare Other

## 2017-01-08 ENCOUNTER — Ambulatory Visit: Payer: Medicare Other

## 2017-01-15 ENCOUNTER — Ambulatory Visit (HOSPITAL_BASED_OUTPATIENT_CLINIC_OR_DEPARTMENT_OTHER): Payer: Medicare Other | Admitting: Oncology

## 2017-01-15 ENCOUNTER — Other Ambulatory Visit (HOSPITAL_BASED_OUTPATIENT_CLINIC_OR_DEPARTMENT_OTHER): Payer: Medicare Other

## 2017-01-15 ENCOUNTER — Telehealth: Payer: Self-pay | Admitting: Oncology

## 2017-01-15 VITALS — BP 156/71 | HR 91 | Temp 98.1°F | Resp 18 | Ht 59.0 in | Wt 153.5 lb

## 2017-01-15 DIAGNOSIS — D649 Anemia, unspecified: Secondary | ICD-10-CM | POA: Diagnosis not present

## 2017-01-15 DIAGNOSIS — Z853 Personal history of malignant neoplasm of breast: Secondary | ICD-10-CM

## 2017-01-15 DIAGNOSIS — C50519 Malignant neoplasm of lower-outer quadrant of unspecified female breast: Secondary | ICD-10-CM

## 2017-01-15 LAB — CBC WITH DIFFERENTIAL/PLATELET
BASO%: 0.3 % (ref 0.0–2.0)
Basophils Absolute: 0 10*3/uL (ref 0.0–0.1)
EOS%: 8 % — ABNORMAL HIGH (ref 0.0–7.0)
Eosinophils Absolute: 0.5 10*3/uL (ref 0.0–0.5)
HCT: 31.5 % — ABNORMAL LOW (ref 34.8–46.6)
HGB: 10.1 g/dL — ABNORMAL LOW (ref 11.6–15.9)
LYMPH%: 40.1 % (ref 14.0–49.7)
MCH: 28.5 pg (ref 25.1–34.0)
MCHC: 32.1 g/dL (ref 31.5–36.0)
MCV: 88.7 fL (ref 79.5–101.0)
MONO#: 0.6 10*3/uL (ref 0.1–0.9)
MONO%: 9.5 % (ref 0.0–14.0)
NEUT#: 2.5 10*3/uL (ref 1.5–6.5)
NEUT%: 42.1 % (ref 38.4–76.8)
Platelets: 177 10*3/uL (ref 145–400)
RBC: 3.55 10*6/uL — ABNORMAL LOW (ref 3.70–5.45)
RDW: 14.1 % (ref 11.2–14.5)
WBC: 6 10*3/uL (ref 3.9–10.3)
lymph#: 2.4 10*3/uL (ref 0.9–3.3)

## 2017-01-15 LAB — IRON AND TIBC
%SAT: 29 % (ref 21–57)
Iron: 67 ug/dL (ref 41–142)
TIBC: 233 ug/dL — ABNORMAL LOW (ref 236–444)
UIBC: 166 ug/dL (ref 120–384)

## 2017-01-15 LAB — COMPREHENSIVE METABOLIC PANEL
ALT: 10 U/L (ref 0–55)
AST: 14 U/L (ref 5–34)
Albumin: 3.6 g/dL (ref 3.5–5.0)
Alkaline Phosphatase: 65 U/L (ref 40–150)
Anion Gap: 9 mEq/L (ref 3–11)
BUN: 22.3 mg/dL (ref 7.0–26.0)
CO2: 25 mEq/L (ref 22–29)
Calcium: 9.3 mg/dL (ref 8.4–10.4)
Chloride: 108 mEq/L (ref 98–109)
Creatinine: 1.3 mg/dL — ABNORMAL HIGH (ref 0.6–1.1)
EGFR: 43 mL/min/{1.73_m2} — ABNORMAL LOW (ref >90)
Glucose: 100 mg/dl (ref 70–140)
Potassium: 4.2 mEq/L (ref 3.5–5.1)
Sodium: 142 mEq/L (ref 136–145)
Total Bilirubin: 0.27 mg/dL (ref 0.20–1.20)
Total Protein: 6.6 g/dL (ref 6.4–8.3)

## 2017-01-15 LAB — FERRITIN: Ferritin: 176 ng/ml (ref 9–269)

## 2017-01-15 NOTE — Telephone Encounter (Signed)
Appointments scheduled per 2/21 LOS. Patient given AVS report and calendars with future scheduled appointments. °

## 2017-01-15 NOTE — Progress Notes (Signed)
Hematology and Oncology Follow Up Visit  Gloria Fields [id number] [birth year] 71 y.o. 01/15/2017 9:20 AM   Principle Diagnosis: 71 year old woman diagnosed with breast cancer in 2007. 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 She also has chronic mild anemia.  She was found to have a ovarian mass on the left side measuring 6 cm in August 2016. The final pathology confirmed an ovarian cyst without malignancy.  Prior therapy:   She underwent lumpectomy 11/04/2006. Estrogen receptor 99% positive, progesterone receptor 74%, Ki67 proliferation marker 6%, HER-2/neu by fish no amplification. She received breast radiation followed by 5 years of Arimidex. She has been in remission since her surgery.  She is status post laparoscopic left salpingo-oophorectomy and lysis of adhesion as well as hernia repair on 10/12/2015. The final pathology from her ovarian mass was benign consistent with ovarian cyst.  Interim History:   Gloria Fields presents today for a follow-up visit. Since the last visit, she reports feeling reasonably well without any recent complaints. She continues to live independently and attends to activities of daily living. 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  She reports no abdominal pain or distention. Has not reported any GYN bleeding. She does not report any breast masses or tenderness. She does not report any excessive fatigue or tiredness. She denied any hematochezia or melena. Her appetite remain excellent and her weight is stable.   She denies any headache or change in vision. She does not report any fevers or chills or sweats. He does not report any chest pain, palpitation orthopnea. She does not report any nausea, vomiting, abdominal pain. She denies any vaginal bleeding or discharge. She does not report any skeletal  complaints of arthralgias or myalgias. She does not report any cough or hemoptysis. Rest of review of systems unremarkable.   Medications: reviewed  Allergies:  Allergies  Allergen Reactions  . Morphine Other (See Comments)    REACTION: paranoid     Physical Exam: Blood pressure (!) 156/71, pulse 91, temperature 98.1 F (36.7 C), temperature source Oral, resp. rate 18, height 4' 11" (1.499 m), weight 153 lb 8 oz (69.6 kg), SpO2 98 %. Wt Readings from Last 3 Encounters:  01/15/17 153 lb 8 oz (69.6 kg)  06/19/16 156 lb 14.4 oz (71.2 kg)  12/21/15 169 lb 9.6 oz (76.9 kg)     ECOG 0 General appearance: Alert, awake woman without distress. HEENT: No oral ulcers or lesions.  Lymph nodes: No cervical, supraclavicular, or axillary lymphadenopathy Lungs: Clear to auscultation, resonant to percussion throughout no dullness to percussion. Heart: Regular rhythm, no murmur, no gallop, no rub, no click, no edema Abdomen: Soft, nontender, normal bowel sounds, no mass, no splenomegaly or ascites. Extremities: No edema, no clubbing or cyanosis. Musculoskeletal: no joint deformities Neurologic: Alert, oriented, no deficits noted. Skin: No rash or ecchymosis  Lab Results: CBC W/Diff    Component Value Date/Time   WBC 6.0 01/15/2017 0901   WBC 9.6 10/13/2015 0440   RBC 3.55 (L) 01/15/2017 0901   RBC 3.13 (L) 10/13/2015 0440   HGB 10.1 (L) 01/15/2017 0901   HCT 31.5 (L) 01/15/2017 0901   PLT 177 01/15/2017 0901   MCV 88.7 01/15/2017 0901   MCH 28.5 01/15/2017 0901   MCH 30.4 10/13/2015 0440   MCHC 32.1 01/15/2017 0901   MCHC 32.9 10/13/2015 0440  RDW 14.1 01/15/2017 0901   LYMPHSABS 2.4 01/15/2017 0901   MONOABS 0.6 01/15/2017 0901   EOSABS 0.5 01/15/2017 0901   BASOSABS 0.0 01/15/2017 0901     Chemistry      Component Value Date/Time   NA 139 12/21/2015 1255   K 4.0 12/21/2015 1255   CL 105 10/13/2015 0440   CO2 24 12/21/2015 1255   BUN 25.8 12/21/2015 1255   CREATININE  1.1 12/21/2015 1255      Component Value Date/Time   CALCIUM 9.6 12/21/2015 1255   ALKPHOS 77 12/21/2015 1255   AST 18 12/21/2015 1255   ALT 16 12/21/2015 1255   BILITOT <0.30 12/21/2015 1255       Impression: 71 year old woman with the following issues  1. Stage II 2.6 cm, ER/PR positive HER-2 negative cancer right breast diagnosed in 2007. She has no evidence of disease at this time and she is up to date on her mammography. Her next mammogram scheduled for later this month.  2. 6.5 cm complex left adnexal mass : She is status post laparoscopic oophorectomy with a pathology revealed ovarian cyst completed in November 2016.  3. Anemia: Her hemoglobin remained stable. Her anemia appears to be of chronic disease and element of iron deficiency. Her iron studies are currently pending from today.  4. Follow-up: Will be in 6 months.      [facility], MD 2/21/20189:20 AM

## 2017-07-15 ENCOUNTER — Ambulatory Visit: Payer: Medicare Other | Admitting: Oncology

## 2017-07-15 ENCOUNTER — Other Ambulatory Visit: Payer: Medicare Other

## 2017-09-04 ENCOUNTER — Other Ambulatory Visit: Payer: Self-pay | Admitting: Physician Assistant

## 2017-09-04 DIAGNOSIS — Z1231 Encounter for screening mammogram for malignant neoplasm of breast: Secondary | ICD-10-CM

## 2017-09-24 ENCOUNTER — Ambulatory Visit
Admission: RE | Admit: 2017-09-24 | Discharge: 2017-09-24 | Disposition: A | Discharge status: Home / Self Care | Payer: Medicare Other | Source: Ambulatory Visit | Attending: Physician Assistant | Admitting: Physician Assistant

## 2017-09-24 DIAGNOSIS — Z1231 Encounter for screening mammogram for malignant neoplasm of breast: Secondary | ICD-10-CM

## 2017-09-24 HISTORY — DX: Personal history of irradiation: Z92.3

## 2018-09-01 ENCOUNTER — Other Ambulatory Visit: Payer: Self-pay

## 2018-09-21 ENCOUNTER — Encounter: Payer: Self-pay | Admitting: Emergency Medicine

## 2018-09-21 DIAGNOSIS — F32A Depression, unspecified: Secondary | ICD-10-CM

## 2018-09-21 DIAGNOSIS — F411 Generalized anxiety disorder: Secondary | ICD-10-CM

## 2018-09-21 DIAGNOSIS — F329 Major depressive disorder, single episode, unspecified: Secondary | ICD-10-CM | POA: Insufficient documentation

## 2018-09-21 DIAGNOSIS — F988 Other specified behavioral and emotional disorders with onset usually occurring in childhood and adolescence: Secondary | ICD-10-CM

## 2018-10-05 ENCOUNTER — Ambulatory Visit (INDEPENDENT_AMBULATORY_CARE_PROVIDER_SITE_OTHER): Payer: Medicare Other | Admitting: Psychiatry

## 2018-10-05 DIAGNOSIS — F419 Anxiety disorder, unspecified: Secondary | ICD-10-CM

## 2018-10-05 DIAGNOSIS — F988 Other specified behavioral and emotional disorders with onset usually occurring in childhood and adolescence: Secondary | ICD-10-CM | POA: Diagnosis not present

## 2018-10-05 DIAGNOSIS — F329 Major depressive disorder, single episode, unspecified: Secondary | ICD-10-CM

## 2018-10-05 DIAGNOSIS — F32A Depression, unspecified: Secondary | ICD-10-CM

## 2018-10-05 NOTE — Progress Notes (Signed)
[facility]  Patient ID: Gloria Fields,  MRN: [id number]  PCP: Lennie Odor, PA-C  Date of Evaluation: 10/05/2018 Time spent:20 minutes  Chief Complaint:   HISTORY/CURRENT STATUS: HPI 72 yr old white female seen 08/24/18.  Anxiety and depression were both better.  Increased her trazodone for depression and increased her Vistaril for sleep.  Patient is doing better.    Individual Medical History/ Review of Systems: Changes? :  Patient has hypertension, GE reflux, ADD, no diabetes or heart disease. Allergies: Morphine  Current Medications:  Current Outpatient Medications:  .  amphetamine-dextroamphetamine (ADDERALL) 10 MG tablet, Take 10 mg by mouth 2 (two) times daily., Disp: , Rfl:  .  antiseptic oral rinse (BIOTENE) LIQD, 15 mLs by Mouth Rinse route as needed for dry mouth (uses when she brushes her teeth)., Disp: , Rfl:  .  busPIRone (BUSPAR) 30 MG tablet, Take 30 mg by mouth 2 (two) times daily., Disp: , Rfl:  .  clonazePAM (KLONOPIN) 0.5 MG tablet, Take 0.5 mg by mouth. 1-2 prn, Disp: , Rfl:  .  dexlansoprazole (DEXILANT) 60 MG capsule, Take 1 capsule (60 mg total) by mouth daily., Disp: 30 capsule, Rfl: 6 .  hydrOXYzine (ATARAX/VISTARIL) 25 MG tablet, Take 25 mg by mouth. 1 - 3/d, Disp: , Rfl:  .  Hypromellose 0.3 % SOLN, Apply 1 drop to eye 2 (two) times daily., Disp: , Rfl:  .  Omega-3 Fatty Acids (FISH OIL) 1000 MG CAPS, Take 1,000 mg by mouth 2 (two) times daily., Disp: , Rfl:  .  ondansetron (ZOFRAN) 4 MG tablet, Take 1 tablet (4 mg total) by mouth every 8 (eight) hours as needed for nausea or vomiting., Disp: 20 tablet, Rfl: 0 .  ranitidine (ZANTAC) 150 MG tablet, , Disp: , Rfl:  .  sertraline (ZOLOFT) 100 MG tablet, Take 100 mg by mouth. 2/d, Disp: , Rfl:  .  traZODone (DESYREL) 100 MG tablet, , Disp: , Rfl:  .  amphetamine-dextroamphetamine (ADDERALL) 20 MG tablet, Take 20 mg by mouth 2 (two) times daily. Takes at 7am and noon, Disp: , Rfl: 0 .  b  complex vitamins tablet, Take 1 tablet by mouth daily. , Disp: , Rfl:  .  Bioflavonoid Products (PERIDRIN-C) [id number] MG TABS, Take 1 capsule by mouth daily. , Disp: , Rfl:  .  Calcium Carb-Cholecalciferol (CALCIUM 600 + D PO), Take 1 tablet by mouth daily., Disp: , Rfl:  .  cholecalciferol (VITAMIN D) 1000 UNITS tablet, Take 1,000 Units by mouth daily., Disp: , Rfl:  .  clonazePAM (KLONOPIN) 1 MG tablet, Take 1 mg by mouth 3 (three) times daily as needed for anxiety. For anxiety, Disp: , Rfl:  .  gabapentin (NEURONTIN) 100 MG capsule, Take 100 mg by mouth 2 (two) times daily., Disp: , Rfl:  .  hydrOXYzine (VISTARIL) 25 MG capsule, , Disp: , Rfl:  .  meloxicam (MOBIC) 15 MG tablet, TAKE 1 TABLET BY MOUTH DAILY WITH FOOD FOR PAIN, Disp: , Rfl: 0 .  Multiple Vitamins-Minerals (CENTRUM SILVER PO), Take 1 capsule by mouth daily. , Disp: , Rfl:  .  PREVIDENT 5000 DRY MOUTH 1.1 % GEL dental gel, , Disp: , Rfl:  .  sertraline (ZOLOFT) 50 MG tablet, Take 50 mg by mouth daily., Disp: , Rfl: 1 .  valsartan-hydrochlorothiazide (DIOVAN HCT) 160-25 MG per tablet, Take 1 tablet by mouth every morning. , Disp: , Rfl:  .  vitamin E 100 UNIT capsule, Take 200 Units by mouth daily. ,  Disp: , Rfl:  Medication Side Effects: none  Family Medical/ Social History: Changes? No  MENTAL HEALTH EXAM:  There were no vitals taken for this visit.There is no height or weight on file to calculate BMI.  General Appearance: Casual  Eye Contact:  Good  Speech:  Clear and Coherent  Volume:  Normal  Mood:  Euthymic  Affect:  Appropriate  Thought Process:  Goal Directed  Orientation:  Full (Time, Place, and Person)  Thought Content: Logical   Suicidal Thoughts:  No  Homicidal Thoughts:  No  Memory:  WNL  Judgement:  Good  Insight:  Good  Psychomotor Activity:  Normal  Concentration:  Concentration: Good  Recall:  Good  Fund of Knowledge: Good  Language: Good  Assets:  Social Support  ADL's:  Intact  Cognition:  WNL  Prognosis:  Good    DIAGNOSES:    ICD-10-CM   1. Attention deficit disorder, unspecified hyperactivity presence F98.8   2. Anxiety disorder, unspecified type F41.9   3. Depression, unspecified depression type F32.9     Receiving Psychotherapy: No    RECOMMENDATIONS: Problems with the computer so had just all prescriptions were done by hand.  Patient is doing well overall.  He wants to continue the same medications.  She she is on Klonopin 0.5 mg 1-2 a day, BuSpar 30 mg daily, Zoloft 100 mg 2 a day, Vistaril 25 mg 3 at bedtime, trazodone 150 mg 1 a day, Adderall 10 mg twice daily also getting a basic metabolic panel for patient with hx. Of  renal problems and will follow the sodium.  Patient return in 3 months  Comer Locket, [place]

## 2018-10-05 NOTE — Progress Notes (Signed)
[facility]  Patient ID: Gloria Fields,  MRN: [id number]  PCP: Lennie Odor, PA-C  Date of Evaluation: 10/05/2018 Time spent:20 minutes  Chief Complaint:   HISTORY/CURRENT STATUS: HPI patient is a 72 year old white female last seen 08/24/2018.  Carries a diagnosis of anxiety, depression rule out bipolar disorder, probable ADD her last visit her anxiety and depression were both better increased her trazodone from 100 to 150 mg for the depression.  I advised her to restart the Vistaril for her anxiety.  Individual Medical History/ Review of Systems: Changes? :No   Allergies: Morphine  Current Medications:  Current Outpatient Medications:  .  amphetamine-dextroamphetamine (ADDERALL) 10 MG tablet, Take 10 mg by mouth 2 (two) times daily., Disp: , Rfl:  .  antiseptic oral rinse (BIOTENE) LIQD, 15 mLs by Mouth Rinse route as needed for dry mouth (uses when she brushes her teeth)., Disp: , Rfl:  .  busPIRone (BUSPAR) 30 MG tablet, Take 30 mg by mouth 2 (two) times daily., Disp: , Rfl:  .  clonazePAM (KLONOPIN) 0.5 MG tablet, Take 0.5 mg by mouth. 1-2 prn, Disp: , Rfl:  .  dexlansoprazole (DEXILANT) 60 MG capsule, Take 1 capsule (60 mg total) by mouth daily., Disp: 30 capsule, Rfl: 6 .  hydrOXYzine (ATARAX/VISTARIL) 25 MG tablet, Take 25 mg by mouth. 1 - 3/d, Disp: , Rfl:  .  Hypromellose 0.3 % SOLN, Apply 1 drop to eye 2 (two) times daily., Disp: , Rfl:  .  Omega-3 Fatty Acids (FISH OIL) 1000 MG CAPS, Take 1,000 mg by mouth 2 (two) times daily., Disp: , Rfl:  .  ondansetron (ZOFRAN) 4 MG tablet, Take 1 tablet (4 mg total) by mouth every 8 (eight) hours as needed for nausea or vomiting., Disp: 20 tablet, Rfl: 0 .  ranitidine (ZANTAC) 150 MG tablet, , Disp: , Rfl:  .  sertraline (ZOLOFT) 100 MG tablet, Take 100 mg by mouth. 2/d, Disp: , Rfl:  .  traZODone (DESYREL) 100 MG tablet, , Disp: , Rfl:  .  amphetamine-dextroamphetamine (ADDERALL) 20 MG tablet, Take 20 mg by mouth 2  (two) times daily. Takes at 7am and noon, Disp: , Rfl: 0 .  b complex vitamins tablet, Take 1 tablet by mouth daily. , Disp: , Rfl:  .  Bioflavonoid Products (PERIDRIN-C) 295-18-841 MG TABS, Take 1 capsule by mouth daily. , Disp: , Rfl:  .  Calcium Carb-Cholecalciferol (CALCIUM 600 + D PO), Take 1 tablet by mouth daily., Disp: , Rfl:  .  cholecalciferol (VITAMIN D) 1000 UNITS tablet, Take 1,000 Units by mouth daily., Disp: , Rfl:  .  clonazePAM (KLONOPIN) 1 MG tablet, Take 1 mg by mouth 3 (three) times daily as needed for anxiety. For anxiety, Disp: , Rfl:  .  gabapentin (NEURONTIN) 100 MG capsule, Take 100 mg by mouth 2 (two) times daily., Disp: , Rfl:  .  hydrOXYzine (VISTARIL) 25 MG capsule, , Disp: , Rfl:  .  meloxicam (MOBIC) 15 MG tablet, TAKE 1 TABLET BY MOUTH DAILY WITH FOOD FOR PAIN, Disp: , Rfl: 0 .  Multiple Vitamins-Minerals (CENTRUM SILVER PO), Take 1 capsule by mouth daily. , Disp: , Rfl:  .  PREVIDENT 5000 DRY MOUTH 1.1 % GEL dental gel, , Disp: , Rfl:  .  sertraline (ZOLOFT) 50 MG tablet, Take 50 mg by mouth daily., Disp: , Rfl: 1 .  valsartan-hydrochlorothiazide (DIOVAN HCT) 160-25 MG per tablet, Take 1 tablet by mouth every morning. , Disp: , Rfl:  .  vitamin  E 100 UNIT capsule, Take 200 Units by mouth daily. , Disp: , Rfl:  Medication Side Effects: none  Family Medical/ Social History: Changes? No  MENTAL HEALTH EXAM:  There were no vitals taken for this visit.There is no height or weight on file to calculate BMI.  General Appearance: Casual  Eye Contact:  Good  Speech:  Normal Rate  Volume:  Normal  Mood:  Euthymic  Affect:  Appropriate  Thought Process:  Goal Directed  Orientation:  Full (Time, Place, and Person)  Thought Content: Logical   Suicidal Thoughts:  No  Homicidal Thoughts:  No  Memory:  WNL  Judgement:  Good  Insight:  Good  Psychomotor Activity:  Normal  Concentration:  Concentration: Good  Recall:  Good  Fund of Knowledge: Good  Language: Good   Assets:  Desire for Improvement  ADL's:  Intact  Cognition: WNL  Prognosis:  Good    DIAGNOSES:    ICD-10-CM   1. Attention deficit disorder, unspecified hyperactivity presence F98.8   2. Anxiety disorder, unspecified type F41.9   3. Depression, unspecified depression type F32.9     Receiving Psychotherapy: No    RECOMMENDATIONS: We will continue the same medication for the patient.  I had to handwrite her prescriptions.  Klonopin 0.5 mg 1-2 a day #60.  BuSpar 30 mg twice daily #60 Zoloft 100 mg 2 a day #60 Vistaril 25 mg 3 a day #90 trazodone 150 mg 1 a day 30 Adderall 10 mg twice daily #60 and 3 prescriptions were given for that for 3 months.  Check in 2 months.  Comer Locket, PA-C

## 2018-12-16 ENCOUNTER — Other Ambulatory Visit: Payer: Self-pay | Admitting: Psychiatry

## 2018-12-16 NOTE — Telephone Encounter (Signed)
Need to review paper chart  

## 2018-12-31 ENCOUNTER — Telehealth: Payer: Self-pay

## 2018-12-31 NOTE — Telephone Encounter (Signed)
Prior authorization submitted for hydroxyzine pamoate 25 mg through [facility] approved effective through 11/25/2019  [id number]  [facility] [place], [place] [phone] phone

## 2019-01-06 ENCOUNTER — Ambulatory Visit: Payer: Medicare Other | Admitting: Psychiatry

## 2019-01-06 DIAGNOSIS — F988 Other specified behavioral and emotional disorders with onset usually occurring in childhood and adolescence: Secondary | ICD-10-CM

## 2019-01-06 DIAGNOSIS — F411 Generalized anxiety disorder: Secondary | ICD-10-CM

## 2019-01-06 DIAGNOSIS — F329 Major depressive disorder, single episode, unspecified: Secondary | ICD-10-CM | POA: Diagnosis not present

## 2019-01-06 DIAGNOSIS — F32A Depression, unspecified: Secondary | ICD-10-CM

## 2019-01-06 MED ORDER — CLONAZEPAM 0.5 MG PO TABS: 0.5000 mg | ORAL_TABLET | Freq: Two times a day (BID) | ORAL | 2 refills | Status: DC

## 2019-01-06 MED ORDER — AMPHETAMINE-DEXTROAMPHETAMINE 10 MG PO TABS: ORAL_TABLET | ORAL | 0 refills | Status: DC

## 2019-01-06 MED ORDER — AMPHETAMINE-DEXTROAMPHETAMINE 10 MG PO TABS: 10.0000 mg | ORAL_TABLET | Freq: Every day | ORAL | 0 refills | Status: DC

## 2019-01-06 MED ORDER — HYDROXYZINE PAMOATE 25 MG PO CAPS: ORAL_CAPSULE | ORAL | 1 refills | Status: DC

## 2019-01-06 MED ORDER — AMPHETAMINE-DEXTROAMPHETAMINE 10 MG PO TABS: 10.0000 mg | ORAL_TABLET | Freq: Two times a day (BID) | ORAL | 0 refills | Status: DC

## 2019-01-06 MED ORDER — BUSPIRONE HCL 30 MG PO TABS: 30.0000 mg | ORAL_TABLET | Freq: Two times a day (BID) | ORAL | 2 refills | Status: DC

## 2019-01-06 MED ORDER — CLONAZEPAM 0.5 MG PO TABS: 0.5000 mg | ORAL_TABLET | Freq: Two times a day (BID) | ORAL | 0 refills | Status: DC | PRN

## 2019-01-06 MED ORDER — TRAZODONE HCL 150 MG PO TABS: 150.0000 mg | ORAL_TABLET | Freq: Every day | ORAL | 1 refills | Status: DC

## 2019-01-06 MED ORDER — SERTRALINE HCL 100 MG PO TABS: 200.0000 mg | ORAL_TABLET | Freq: Every day | ORAL | 1 refills | Status: DC

## 2019-01-06 MED ORDER — GABAPENTIN 100 MG PO CAPS: 100.0000 mg | ORAL_CAPSULE | Freq: Two times a day (BID) | ORAL | 1 refills | Status: DC

## 2019-01-06 NOTE — Progress Notes (Signed)
[facility]  Patient ID: Gloria Fields,  MRN: [id number]  PCP: Lennie Odor, PA-C  Date of Evaluation: 01/06/2019 Time spent:20 minutes  Chief Complaint:   HISTORY/CURRENT STATUS: HPI seen in September of last year.  Anxiety, depression, rule out bipolar disorder, probable ADD.  She is doing well.  She does have some depression but it is better.  ADD sometimes problems with her focus.  Individual Medical History/ Review of Systems: Changes? :No   Allergies: Morphine  Current Medications:  Current Outpatient Medications:  .  amphetamine-dextroamphetamine (ADDERALL) 10 MG tablet, Take 10 mg by mouth 2 (two) times daily., Disp: , Rfl:  .  amphetamine-dextroamphetamine (ADDERALL) 20 MG tablet, Take 20 mg by mouth 2 (two) times daily. Takes at 7am and noon, Disp: , Rfl: 0 .  antiseptic oral rinse (BIOTENE) LIQD, 15 mLs by Mouth Rinse route as needed for dry mouth (uses when she brushes her teeth)., Disp: , Rfl:  .  b complex vitamins tablet, Take 1 tablet by mouth daily. , Disp: , Rfl:  .  Bioflavonoid Products (PERIDRIN-C) 193-79-024 MG TABS, Take 1 capsule by mouth daily. , Disp: , Rfl:  .  busPIRone (BUSPAR) 30 MG tablet, Take 30 mg by mouth 2 (two) times daily., Disp: , Rfl:  .  Calcium Carb-Cholecalciferol (CALCIUM 600 + D PO), Take 1 tablet by mouth daily., Disp: , Rfl:  .  cholecalciferol (VITAMIN D) 1000 UNITS tablet, Take 1,000 Units by mouth daily., Disp: , Rfl:  .  clonazePAM (KLONOPIN) 0.5 MG tablet, Take 0.5 mg by mouth. 1-2 prn, Disp: , Rfl:  .  clonazePAM (KLONOPIN) 1 MG tablet, Take 1 mg by mouth 3 (three) times daily as needed for anxiety. For anxiety, Disp: , Rfl:  .  dexlansoprazole (DEXILANT) 60 MG capsule, Take 1 capsule (60 mg total) by mouth daily., Disp: 30 capsule, Rfl: 6 .  gabapentin (NEURONTIN) 100 MG capsule, Take 100 mg by mouth 2 (two) times daily., Disp: , Rfl:  .  hydrOXYzine (ATARAX/VISTARIL) 25 MG tablet, Take 25 mg by mouth. 1 - 3/d,  Disp: , Rfl:  .  hydrOXYzine (VISTARIL) 25 MG capsule, , Disp: , Rfl:  .  Hypromellose 0.3 % SOLN, Apply 1 drop to eye 2 (two) times daily., Disp: , Rfl:  .  meloxicam (MOBIC) 15 MG tablet, TAKE 1 TABLET BY MOUTH DAILY WITH FOOD FOR PAIN, Disp: , Rfl: 0 .  Multiple Vitamins-Minerals (CENTRUM SILVER PO), Take 1 capsule by mouth daily. , Disp: , Rfl:  .  Omega-3 Fatty Acids (FISH OIL) 1000 MG CAPS, Take 1,000 mg by mouth 2 (two) times daily., Disp: , Rfl:  .  ondansetron (ZOFRAN) 4 MG tablet, Take 1 tablet (4 mg total) by mouth every 8 (eight) hours as needed for nausea or vomiting., Disp: 20 tablet, Rfl: 0 .  PREVIDENT 5000 DRY MOUTH 1.1 % GEL dental gel, , Disp: , Rfl:  .  ranitidine (ZANTAC) 150 MG tablet, , Disp: , Rfl:  .  sertraline (ZOLOFT) 100 MG tablet, Take 100 mg by mouth. 2/d, Disp: , Rfl:  .  sertraline (ZOLOFT) 50 MG tablet, Take 50 mg by mouth daily., Disp: , Rfl: 1 .  traZODone (DESYREL) 150 MG tablet, Take 1 tablet (150 mg total) by mouth daily., Disp: 90 tablet, Rfl: 0 .  valsartan-hydrochlorothiazide (DIOVAN HCT) 160-25 MG per tablet, Take 1 tablet by mouth every morning. , Disp: , Rfl:  .  vitamin E 100 UNIT capsule, Take 200 Units by  mouth daily. , Disp: , Rfl:  Medication Side Effects: none  Family Medical/ Social History: Changes? No  MENTAL HEALTH EXAM:  There were no vitals taken for this visit.There is no height or weight on file to calculate BMI.  General Appearance: Casual  Eye Contact:  Good  Speech:  Clear and Coherent  Volume:  Normal  Mood:  Euthymic  Affect:  Appropriate  Thought Process:  Linear  Orientation:  Full (Time, Place, and Person)  Thought Content: Logical   Suicidal Thoughts:  No  Homicidal Thoughts:  No  Memory:  WNL  Judgement:  Good  Insight:  Good  Psychomotor Activity:  Normal  Concentration:  Concentration: Good  Recall:  Good  Fund of Knowledge: Good  Language: Good  Assets:  Desire for Improvement  ADL's:  Intact  Cognition:  WNL  Prognosis:  Good    DIAGNOSES: No diagnosis found.  Receiving Psychotherapy: No    RECOMMENDATIONS: We will continue her medications.  Klonopin 1 to 2 tablets a day, BuSpar 30 mg twice daily, Zoloft 100 mg 2 a day, Vistaril 75 mg at bedtime trazodone 50 mg 2 at night.  So we will start her on gabapentin 100 mg twice daily for anxiety.  We will get a comprehensive metabolic panel at her next visit to watch her sodium and renal problems. We will also watch for anticholinergic side effects from her medications. We will see her in 3 months.   Comer Locket, PA-C

## 2019-03-25 ENCOUNTER — Other Ambulatory Visit: Payer: Self-pay

## 2019-03-25 MED ORDER — HYDROXYZINE PAMOATE 25 MG PO CAPS: ORAL_CAPSULE | ORAL | 1 refills | Status: DC

## 2019-04-07 ENCOUNTER — Ambulatory Visit: Payer: Medicare Other | Admitting: Psychiatry

## 2019-04-12 ENCOUNTER — Other Ambulatory Visit: Payer: Self-pay | Admitting: Physician Assistant

## 2019-04-12 DIAGNOSIS — Z1231 Encounter for screening mammogram for malignant neoplasm of breast: Secondary | ICD-10-CM

## 2019-04-22 ENCOUNTER — Encounter: Payer: Self-pay | Admitting: General Surgery

## 2019-04-23 ENCOUNTER — Ambulatory Visit (INDEPENDENT_AMBULATORY_CARE_PROVIDER_SITE_OTHER): Payer: Medicare Other | Admitting: Internal Medicine

## 2019-04-23 ENCOUNTER — Other Ambulatory Visit: Payer: Self-pay

## 2019-04-23 ENCOUNTER — Telehealth: Payer: Self-pay

## 2019-04-23 ENCOUNTER — Encounter: Payer: Self-pay | Admitting: Internal Medicine

## 2019-04-23 VITALS — Ht 60.0 in | Wt 163.0 lb

## 2019-04-23 DIAGNOSIS — R112 Nausea with vomiting, unspecified: Secondary | ICD-10-CM

## 2019-04-23 DIAGNOSIS — K580 Irritable bowel syndrome with diarrhea: Secondary | ICD-10-CM | POA: Diagnosis not present

## 2019-04-23 MED ORDER — RIFAXIMIN 550 MG PO TABS: 550.0000 mg | ORAL_TABLET | Freq: Three times a day (TID) | ORAL | 0 refills | Status: DC

## 2019-04-23 NOTE — Patient Instructions (Signed)
I am sorry you are continuing to have these problems with diarrhea and the nausea and vomiting.  The one good thing I would say is it does not seem to be a serious health problem.  However, the fact that it persists and impacts her life as it does is an understandable problem.  I sent a prescription for Xifaxan to your pharmacy.  That treats irritable bowel syndrome and also something called small intestinal bacterial overgrowth or SIBO.  You may have this.  If so this treatment can help it but could require recurrent treatment.  If after you take the antibiotics you are feeling well just go with that and let me know when the symptoms returned and we could retreat.  If you do not get benefit, then also let me know at that time and I will see if there is something else we could do.  If it is adhesions and partial blockages or obstructions, that is unlikely something we could fix.  I hope that is not it.   I appreciate the opportunity to care for you. Gatha Mayer, MD, [facility]

## 2019-04-23 NOTE — Progress Notes (Signed)
TELEHEALTH ENCOUNTER IN SETTING OF COVID-19 PANDEMIC - REQUESTED BY PATIENT SERVICE PROVIDED BY TELEMEDECINE - TYPE: Telephone PATIENT LOCATION: home PATIENT HAS CONSENTED TO TELEHEALTH VISIT PROVIDER LOCATION: OFFICE REFERRING PROVIDER:N/A PARTICIPANTS OTHER THAN PATIENT:nineTIME SPENT ON CALL:22 mins    Gloria Fields 73 y.o. [birth year] [id number]  Assessment & Plan:   Encounter Diagnoses  Name Primary?   Irritable bowel syndrome with diarrhea Yes   Nausea and vomiting, intractability of vomiting not specified, unspecified vomiting type     This is a difficult problem though not life-threatening obviously is a major quality of life issue.  Maybe she has small intestinal bacterial overgrowth causing these problems.  It could be recurrent partial bowel obstructions.  The latter is probably not fixable.  I am going to treat her with Xifaxan 550 mg 3 times daily x2 weeks.  She is to contact me if that does not work, and if it does work she is to contact me when she has recurrent symptoms and we would retreat.    Subjective:   Chief Complaint: "I am getting tired of these spells of vomiting and diarrhea"  HPI The patient is a 73 year old white woman with a long history of episodic nausea vomiting and diarrhea.  She is tired of dealing with this.  I first met her in 2016 after her GI physician, Dr. Sharlett Iles, retired.  She does have a history of a mild pyloric stenosis and she has stenosis of the rectosigmoid area that prevents colonoscopy even with an ultra slim scope.  Over the years she has these episodes where she will have "rotten egg burps" and then started to have nausea vomiting and diarrhea sometimes simultaneously.  She has chronic postprandial urgent defecation that is anywhere from semi-formed to loose and small to large she says.  This greatly impacts her life, she is afraid to travel and go places because she might have an episode.  They are occurring about once every  3 weeks now.  They might have begun after a perforated appendix, but it is not entirely clear to me.  She has had lysis of adhesions at least once if not twice, she had a cystic ovarian lesion that turned out to be a benign lesion removed with oophorectomy in 2016 after I had met her and then she also had lysis of adhesions and mesh repair of a small recurrent hernia.  Back in 2012 Dr. Sharlett Iles did treat her with Xifaxan 550 mg 3 times daily for 2 weeks.  She cannot recall if that provided benefit.  She does think at one point there might of been 1 year where she went without significant issues but again cannot remember 1. She does admit that she suffers with anxiety and some anxiety will make her nauseous and have vomiting but the spells are not always related to that.  Last attempted colonoscopy was in 2016 and was incomplete.  EGD showed mild pyloric stenosis at that time.  Wt Readings from Last 3 Encounters:  04/23/19 163 lb (73.9 kg)  04/22/19 163 lb (73.9 kg)  01/15/17 153 lb 8 oz (69.6 kg)    Allergies  Allergen Reactions   Morphine Other (See Comments)    REACTION: paranoid   Current Meds  Medication Sig   amphetamine-dextroamphetamine (ADDERALL) 10 MG tablet 1 bid   antiseptic oral rinse (BIOTENE) LIQD 15 mLs by Mouth Rinse route as needed for dry mouth (uses when she brushes her teeth).   busPIRone (BUSPAR) 30 MG tablet  Take 1 tablet (30 mg total) by mouth 2 (two) times daily.   clonazePAM (KLONOPIN) 0.5 MG tablet Take 1 tablet (0.5 mg total) by mouth 2 (two) times daily. 1-2 prn   dexlansoprazole (DEXILANT) 60 MG capsule Take 1 capsule (60 mg total) by mouth daily.   gabapentin (NEURONTIN) 100 MG capsule Take 1 capsule (100 mg total) by mouth 2 (two) times daily.   hydrOXYzine (ATARAX/VISTARIL) 25 MG tablet Take 25 mg by mouth. 1 - 3/d   Hypromellose 0.3 % SOLN Apply 1 drop to eye 2 (two) times daily.   meloxicam (MOBIC) 15 MG tablet TAKE 1 TABLET BY MOUTH DAILY WITH  FOOD FOR PAIN   Omega-3 Fatty Acids (FISH OIL) 1000 MG CAPS Take 1,000 mg by mouth 2 (two) times daily.   ondansetron (ZOFRAN) 4 MG tablet Take 1 tablet (4 mg total) by mouth every 8 (eight) hours as needed for nausea or vomiting.   PREVIDENT 5000 DRY MOUTH 1.1 % GEL dental gel    ranitidine (ZANTAC) 150 MG tablet    sertraline (ZOLOFT) 100 MG tablet Take 100 mg by mouth. 2/d   sertraline (ZOLOFT) 100 MG tablet Take 2 tablets (200 mg total) by mouth daily.   traZODone (DESYREL) 150 MG tablet Take 1 tablet (150 mg total) by mouth daily.   valsartan-hydrochlorothiazide (DIOVAN HCT) 160-25 MG per tablet Take 1 tablet by mouth every morning.    Past Medical History:  Diagnosis Date   ADD (attention deficit disorder)    Anemia, normocytic normochromic 12/25/2012   Mild, chronic, Hb 11-12 grams at least since 11/2006; screening studies & endoscopies unremarkable   Anxiety    Back pain    Breast cancer ([place]) 2007   Chronic kidney disease    hx of stage 3 kidney disease    Depression    Diverticulosis of colon (without mention of hemorrhage)    Esophageal reflux    H/O dehydration    Headache    PMH   Heart murmur    History of blood transfusion    History of radiation therapy    Hypercholesterolemia    Hypertension    Irritable bowel syndrome    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 ([place]) 12/24/2013   Osteopenia    Ovarian cystic mass 07/07/2015   Personal history of radiation therapy 2008   PONV (postoperative nausea and vomiting)    also had severe itching    Pyloric stenosis    Repeated falls    Shortness of breath dyspnea    walking distances or climbing stairs   Past Surgical History:  Procedure Laterality Date   APPENDECTOMY  2007   perforated appendicitis   BREAST LUMPECTOMY  2007   right   COLONOSCOPY     COLONOSCOPY WITH PROPOFOL N/A 07/07/2015   Procedure: COLONOSCOPY WITH PROPOFOL;  Surgeon: Gatha Mayer,  MD;  Location: [facility];  Service: Endoscopy;  Laterality: N/A;  NEED ULTRA SLIM COLONOSCOPE PLEASE, possible pyloric dilation   ESOPHAGOGASTRODUODENOSCOPY     ESOPHAGOGASTRODUODENOSCOPY N/A 07/07/2015   Procedure: ESOPHAGOGASTRODUODENOSCOPY (EGD);  Surgeon: Gatha Mayer, MD;  Location: [facility] ENDOSCOPY;  Service: Endoscopy;  Laterality: N/A;  POSSIBLE PYLORIC DILATION   HERNIA REPAIR     LAPAROSCOPIC BILATERAL SALPINGO OOPHERECTOMY Bilateral 10/12/2015   Procedure: LAPAROSCOPIC  LEFT SALPINGO OOPHORECTOMY AND LYSIS OF ADHESIONS ;  Surgeon: Everitt Amber, MD;  Location: [facility];  Service: Gynecology;  Laterality: Bilateral;   LAPAROSCOPIC LYSIS OF ADHESIONS  10/29/2012  Procedure: LAPAROSCOPIC LYSIS OF ADHESIONS;  Surgeon: Pedro Earls, MD;  Location: [facility];  Service: General;;   UPPER GASTROINTESTINAL ENDOSCOPY  Multiple   UTERINE FIBROID SURGERY     with subsequent scar tissue removal   VENTRAL HERNIA REPAIR  10/29/2012   Procedure: LAPAROSCOPIC VENTRAL HERNIA;  Surgeon: Pedro Earls, MD;  Location: [facility];  Service: General;  Laterality: N/A;  [place] LYSIS OF ADHESIONS    VENTRAL HERNIA REPAIR N/A 10/12/2015   Procedure: LAPAROSCOPIC VENTRAL HERNIA REPAIR ;  Surgeon: Johnathan Hausen, MD;  Location: [facility];  Service: General;  Laterality: N/A;   WISDOM TOOTH EXTRACTION     Social History   Social History Narrative   She is divorced. No children. She is a retired Statistician.    Lives alone.   One caffeinated beverage daily.      family history includes Breast cancer in her maternal aunt, maternal aunt, and maternal aunt; Diabetes in her maternal aunt; Other in her father; Pancreatic cancer in her cousin; Prostate cancer in her maternal grandfather.   Review of Systems As per HPI - otherwise negative

## 2019-04-23 NOTE — Telephone Encounter (Signed)
I got patient's xifaxan approved thru [employer] , good thru 05/07/2019.

## 2019-05-12 ENCOUNTER — Encounter: Payer: Self-pay | Admitting: Psychiatry

## 2019-05-12 ENCOUNTER — Other Ambulatory Visit: Payer: Self-pay

## 2019-05-12 ENCOUNTER — Ambulatory Visit (INDEPENDENT_AMBULATORY_CARE_PROVIDER_SITE_OTHER): Payer: Medicare Other | Admitting: Psychiatry

## 2019-05-12 DIAGNOSIS — F3342 Major depressive disorder, recurrent, in full remission: Secondary | ICD-10-CM | POA: Diagnosis not present

## 2019-05-12 DIAGNOSIS — F411 Generalized anxiety disorder: Secondary | ICD-10-CM | POA: Diagnosis not present

## 2019-05-12 DIAGNOSIS — F5105 Insomnia due to other mental disorder: Secondary | ICD-10-CM | POA: Diagnosis not present

## 2019-05-12 DIAGNOSIS — F988 Other specified behavioral and emotional disorders with onset usually occurring in childhood and adolescence: Secondary | ICD-10-CM

## 2019-05-12 MED ORDER — AMPHETAMINE-DEXTROAMPHETAMINE 10 MG PO TABS: ORAL_TABLET | ORAL | 0 refills | Status: DC

## 2019-05-12 NOTE — Progress Notes (Signed)
Gloria Fields [id number] 11-01-1946 73 y.o.  Subjective:   Patient ID:  Gloria Fields is a 73 y.o. (DOB 25-Nov-1946) female.  Chief Complaint:  Chief Complaint  Patient presents with  . Follow-up    Medication Management  . Anxiety    HPI [name] presents to the office today for follow-up of depression anxiety, ADD, sleep.  Last seen in September 2019.  Trazodone was increased to 150 mg nightly for sleep and depression.  She was also given hydroxyzine 25 mg 3 times daily for anxiety.  I'm doing ok overall.  Enjoys staying home.  Retired Pharmacist, hospital.  Jumps from one thing to next without the Adderall. Thinks she benefits from the meds.   Patient reports stable mood and denies depressed or irritable moods.  Patient denies any recent difficulty with anxiety.  Better lately with less GI problems.  Chronic intermittent GI sx.  Anxiety flares can cause sweating.  Patient denies difficulty with sleep initiation or maintenance. Denies appetite disturbance.  Patient reports that energy and motivation have been good.  Patient denies any difficulty with concentration.  Patient denies any suicidal ideation. I don't like change.  Past Psychiatric Medication Trials: Patient reportedly been on Adderall for about 25 years.  Clonazepam 0.5, buspirone 30 twice daily, sertraline 200, trazodone, gabapentin, hydroxyzine, Lunesta, olanzapine, Abilify, Saphris, Wellbutrin, citalopram, lorazepam  Review of Systems:  Review of Systems  Gastrointestinal: Positive for abdominal pain, diarrhea and vomiting.  Musculoskeletal: Positive for arthralgias.  Neurological: Negative for tremors and weakness.    Medications: I have reviewed the patient's current medications.  Current Outpatient Medications  Medication Sig Dispense Refill  . amphetamine-dextroamphetamine (ADDERALL) 10 MG tablet 1 bid 180 tablet 0  . antiseptic oral rinse (BIOTENE) LIQD 15 mLs by Mouth Rinse route as needed for dry mouth  (uses when she brushes her teeth).    . busPIRone (BUSPAR) 30 MG tablet Take 1 tablet (30 mg total) by mouth 2 (two) times daily. 180 tablet 2  . clonazePAM (KLONOPIN) 0.5 MG tablet Take 1 tablet (0.5 mg total) by mouth 2 (two) times daily. 1-2 prn 180 tablet 2  . dexlansoprazole (DEXILANT) 60 MG capsule Take 1 capsule (60 mg total) by mouth daily. 30 capsule 6  . gabapentin (NEURONTIN) 100 MG capsule Take 1 capsule (100 mg total) by mouth 2 (two) times daily. 180 capsule 1  . hydrOXYzine (ATARAX/VISTARIL) 25 MG tablet Take 25 mg by mouth. 1 - 3/d    . Hypromellose 0.3 % SOLN Apply 1 drop to eye 2 (two) times daily.    . Omega-3 Fatty Acids (FISH OIL) 1000 MG CAPS Take 1,000 mg by mouth 2 (two) times daily.    . ondansetron (ZOFRAN) 4 MG tablet Take 1 tablet (4 mg total) by mouth every 8 (eight) hours as needed for nausea or vomiting. 20 tablet 0  . PREVIDENT 5000 DRY MOUTH 1.1 % GEL dental gel     . rifaximin (XIFAXAN) 550 MG TABS tablet Take 1 tablet (550 mg total) by mouth 3 (three) times daily. 42 tablet 0  . sertraline (ZOLOFT) 100 MG tablet Take 2 tablets (200 mg total) by mouth daily. 180 tablet 1  . traZODone (DESYREL) 150 MG tablet Take 1 tablet (150 mg total) by mouth daily. (Patient taking differently: Take 300 mg by mouth daily. ) 90 tablet 1  . valsartan-hydrochlorothiazide (DIOVAN HCT) 160-25 MG per tablet Take 1 tablet by mouth every morning.     . clotrimazole-betamethasone (LOTRISONE) cream  APPLY TO AFFECTED AREA TOPICALLY 3 TIMES A DAY FOR 3 MONTHS    . famotidine (PEPCID) 40 MG tablet Take 40 mg by mouth 2 (two) times daily.     No current facility-administered medications for this visit.     Medication Side Effects: None  Allergies:  Allergies  Allergen Reactions  . Morphine Other (See Comments)    REACTION: paranoid    Past Medical History:  Diagnosis Date  . ADD (attention deficit disorder)   . Anemia, normocytic normochromic 12/25/2012   Mild, chronic, Hb 11-12  grams at least since 11/2006; screening studies & endoscopies unremarkable  . Anxiety   . Back pain   . Breast cancer ([place]) 2007  . Chronic kidney disease    hx of stage 3 kidney disease   . Depression   . Diverticulosis of colon (without mention of hemorrhage)   . Esophageal reflux   . H/O dehydration   . Headache    PMH  . Heart murmur   . History of blood transfusion   . History of radiation therapy   . Hypercholesterolemia   . Hypertension   . Irritable bowel syndrome   . 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 Osteopenia   . Ovarian cystic mass 07/07/2015  . Personal history of radiation therapy 2008  . PONV (postoperative nausea and vomiting)    also had severe itching   . Pyloric stenosis   . Repeated falls   . Shortness of breath dyspnea    walking distances or climbing stairs    Family History  Problem Relation Age of Onset  . Other Father        colon rupture  . Pancreatic cancer Cousin   . Diabetes Maternal Aunt   . Breast cancer Maternal Aunt   . Prostate cancer Maternal Grandfather   . Breast cancer Maternal Aunt        X 3  . Breast cancer Maternal Aunt     Social History   Socioeconomic History  . Marital status: Divorced    Spouse name: Not on file  . Number of children: 0  . Years of education: Not on file  . Highest education level: Not on file  Occupational History  . Occupation: Retired Product manager: RETIRED  Social Needs  . Financial resource strain: Not on file  . Food insecurity    Worry: Not on file    Inability: Not on file  . Transportation needs    Medical: Not on file    Non-medical: Not on file  Tobacco Use  . Smoking status: Never Smoker  . Smokeless tobacco: Never Used  Substance and Sexual Activity  . Alcohol use: Yes    Alcohol/week: 0.0 standard drinks    Comment: rarely  . Drug use: No  . Sexual activity: Not on file  Lifestyle  . Physical activity    Days per week:  Not on file    Minutes per session: Not on file  . Stress: Not on file  Relationships  . Social Herbalist on phone: Not on file    Gets together: Not on file    Attends religious service: Not on file    Active member of club or organization: Not on file    Attends meetings of clubs or organizations: Not on file    Relationship status: Not on file  . Intimate partner violence    Fear of current  or ex partner: Not on file    Emotionally abused: Not on file    Physically abused: Not on file    Forced sexual activity: Not on file  Other Topics Concern  . Not on file  Social History Narrative   She is divorced. No children. She is a retired Statistician.    Lives alone.   One caffeinated beverage daily.       Past Medical History, Surgical history, Social history, and Family history were reviewed and updated as appropriate.   Lives now with man shes dated for 20 years who moved in due to PD  Please see review of systems for further details on the patient's review from today.   Objective:   Physical Exam:  There were no vitals taken for this visit.  Physical Exam Constitutional:      General: She is not in acute distress.    Appearance: She is well-developed.  Musculoskeletal:        General: No deformity.  Neurological:     Mental Status: She is alert and oriented to person, place, and time.     Cranial Nerves: No dysarthria.     Coordination: Coordination normal.  Psychiatric:        Attention and Perception: Attention normal. She is attentive.        Mood and Affect: Mood is anxious. Mood is not depressed. Affect is not labile, blunt, angry or inappropriate.        Speech: Speech normal. Speech is not rapid and pressured or slurred.        Behavior: Behavior normal.        Thought Content: Thought content normal. Thought content does not include homicidal or suicidal ideation. Thought content does not include homicidal or suicidal plan.         Cognition and Memory: Cognition normal.        Judgment: Judgment normal.     Comments: Insight is fair.  Mildly scattered thought.       Lab Review:     Component Value Date/Time   NA 142 01/15/2017 0901   K 4.2 01/15/2017 0901   CL 105 10/13/2015 0440   CO2 25 01/15/2017 0901   GLUCOSE 100 01/15/2017 0901   BUN 22.3 01/15/2017 0901   CREATININE 1.3 (H) 01/15/2017 0901   CALCIUM 9.3 01/15/2017 0901   PROT 6.6 01/15/2017 0901   ALBUMIN 3.6 01/15/2017 0901   AST 14 01/15/2017 0901   ALT 10 01/15/2017 0901   ALKPHOS 65 01/15/2017 0901   BILITOT 0.27 01/15/2017 0901   GFRNONAA >60 10/13/2015 0440   GFRAA >60 10/13/2015 0440       Component Value Date/Time   WBC 6.0 01/15/2017 0901   WBC 9.6 10/13/2015 0440   RBC 3.55 (L) 01/15/2017 0901   RBC 3.13 (L) 10/13/2015 0440   HGB 10.1 (L) 01/15/2017 0901   HCT 31.5 (L) 01/15/2017 0901   PLT 177 01/15/2017 0901   MCV 88.7 01/15/2017 0901   MCH 28.5 01/15/2017 0901   MCH 30.4 10/13/2015 0440   MCHC 32.1 01/15/2017 0901   MCHC 32.9 10/13/2015 0440   RDW 14.1 01/15/2017 0901   LYMPHSABS 2.4 01/15/2017 0901   MONOABS 0.6 01/15/2017 0901   EOSABS 0.5 01/15/2017 0901   BASOSABS 0.0 01/15/2017 0901    No results found for: POCLITH, LITHIUM   No results found for: PHENYTOIN, PHENOBARB, VALPROATE, CBMZ   .res Assessment: Plan:    Larinda was seen today for  follow-up and anxiety.  Diagnoses and all orders for this visit:  Generalized anxiety disorder  Attention deficit disorder, unspecified hyperactivity presence -     amphetamine-dextroamphetamine (ADDERALL) 10 MG tablet; 1 bid  Recurrent major depression in full remission ([place])  Insomnia due to mental condition  On the same meds for several years except for the hydroxyzine added at last visit.  We discussed the short-term risks associated with benzodiazepines including sedation and increased fall risk among others.  Discussed long-term side effect risk including  dependence, potential withdrawal symptoms, and the potential eventual dose-related risk of dementia. Also cognitive risks of hydroxyzine.    Had NM with trazodone alone and hydroxyzine added seemed to help that.  Benefit and tolerating meds.  No clear med changes indicated but encouraged her to limit sedatives as much as possible and esp with the Adderall.  Supportive therapy dealing with BF with PD  FU 6 mos  Lynder Parents, MD, DFAPA   Please see After Visit Summary for patient specific instructions.  No future appointments.  No orders of the defined types were placed in this encounter.   -------------------------------

## 2019-05-14 ENCOUNTER — Other Ambulatory Visit: Payer: Self-pay

## 2019-05-14 MED ORDER — TRAZODONE HCL 150 MG PO TABS: 150.0000 mg | ORAL_TABLET | Freq: Every day | ORAL | 1 refills | Status: DC

## 2019-05-17 ENCOUNTER — Other Ambulatory Visit: Payer: Self-pay

## 2019-05-17 MED ORDER — BUSPIRONE HCL 30 MG PO TABS: 30.0000 mg | ORAL_TABLET | Freq: Two times a day (BID) | ORAL | 2 refills | Status: DC

## 2019-06-22 ENCOUNTER — Telehealth: Payer: Self-pay | Admitting: Internal Medicine

## 2019-06-22 NOTE — Telephone Encounter (Signed)
Pt states that she has been experiencing diarrhea for the past 2 weeks and his acid reflux has also gotten worse. She would like some advise.

## 2019-06-22 NOTE — Telephone Encounter (Signed)
Patient reports that her diarrhea has returned.  She did have good success with xifaxan.  She is asking to try another round.  Okay to send in? Patient notified that Dr. Carlean Purl is out of the office, but I will let her know when he answers back, but may be a few days.

## 2019-06-23 ENCOUNTER — Telehealth: Payer: Self-pay

## 2019-06-23 MED ORDER — RIFAXIMIN 550 MG PO TABS: 550.0000 mg | ORAL_TABLET | Freq: Three times a day (TID) | ORAL | 0 refills | Status: DC

## 2019-06-23 NOTE — Telephone Encounter (Signed)
The xifaxan has been approved thru 07/07/2019. [facility] informed and I left a voice mail for [place].

## 2019-06-23 NOTE — Telephone Encounter (Signed)
Patient notified that refill has been sent.  She will call back for any additional questions or concerns.

## 2019-06-23 NOTE — Telephone Encounter (Signed)
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 I called and left Becky a message on her cell # that this is being worked on.

## 2019-06-23 NOTE — Telephone Encounter (Signed)
OK to retreat with Xifaxan 550 mg tid x 14 days

## 2019-07-05 ENCOUNTER — Other Ambulatory Visit: Payer: Self-pay | Admitting: Psychiatry

## 2019-07-06 ENCOUNTER — Other Ambulatory Visit: Payer: Self-pay

## 2019-07-06 MED ORDER — SERTRALINE HCL 100 MG PO TABS: 200.0000 mg | ORAL_TABLET | Freq: Every day | ORAL | 1 refills | Status: DC

## 2019-07-06 MED ORDER — GABAPENTIN 100 MG PO CAPS: 100.0000 mg | ORAL_CAPSULE | Freq: Two times a day (BID) | ORAL | 1 refills | Status: DC

## 2019-09-08 ENCOUNTER — Other Ambulatory Visit: Payer: Self-pay

## 2019-09-08 ENCOUNTER — Ambulatory Visit
Admission: RE | Admit: 2019-09-08 | Discharge: 2019-09-08 | Disposition: A | Discharge status: Home / Self Care | Payer: Medicare Other | Source: Ambulatory Visit | Attending: Physician Assistant | Admitting: Physician Assistant

## 2019-09-08 DIAGNOSIS — Z1231 Encounter for screening mammogram for malignant neoplasm of breast: Secondary | ICD-10-CM

## 2019-09-10 ENCOUNTER — Other Ambulatory Visit: Payer: Self-pay | Admitting: Physician Assistant

## 2019-09-10 DIAGNOSIS — R928 Other abnormal and inconclusive findings on diagnostic imaging of breast: Secondary | ICD-10-CM

## 2019-09-14 ENCOUNTER — Ambulatory Visit
Admission: RE | Admit: 2019-09-14 | Discharge: 2019-09-14 | Disposition: A | Discharge status: Home / Self Care | Payer: Medicare Other | Source: Ambulatory Visit | Attending: Physician Assistant | Admitting: Physician Assistant

## 2019-09-14 ENCOUNTER — Other Ambulatory Visit: Payer: Self-pay

## 2019-09-14 DIAGNOSIS — R928 Other abnormal and inconclusive findings on diagnostic imaging of breast: Secondary | ICD-10-CM

## 2019-10-26 ENCOUNTER — Telehealth: Payer: Self-pay | Admitting: Psychiatry

## 2019-10-26 NOTE — Telephone Encounter (Signed)
Becky called to request refill of her Adderall and Klonopin.  Appt 11/10/19.  [facility] [address],

## 2019-10-27 ENCOUNTER — Other Ambulatory Visit: Payer: Self-pay

## 2019-10-27 DIAGNOSIS — F988 Other specified behavioral and emotional disorders with onset usually occurring in childhood and adolescence: Secondary | ICD-10-CM

## 2019-10-27 MED ORDER — CLONAZEPAM 0.5 MG PO TABS: 0.5000 mg | ORAL_TABLET | Freq: Two times a day (BID) | ORAL | 0 refills | Status: DC

## 2019-10-27 MED ORDER — AMPHETAMINE-DEXTROAMPHETAMINE 10 MG PO TABS: ORAL_TABLET | ORAL | 0 refills | Status: DC

## 2019-10-27 NOTE — Telephone Encounter (Signed)
Pended both Rx's for 90 day supply Last refill Adderall 05/12/2019 #180 Last refill Klonopin 01/2019 #180

## 2019-11-04 ENCOUNTER — Telehealth: Payer: Self-pay | Admitting: Internal Medicine

## 2019-11-04 NOTE — Telephone Encounter (Signed)
I spoke with [facility] and she said that last night she started with liquid stools and vomited once this AM and she is burping up "rotten eggs". No fever. She is requesting another round of xifaxan. She is aware Dr Carlean Purl is out of the country and he will return to the office Monday.

## 2019-11-08 MED ORDER — RIFAXIMIN 550 MG PO TABS: 550.0000 mg | ORAL_TABLET | Freq: Three times a day (TID) | ORAL | 0 refills | Status: DC

## 2019-11-08 NOTE — Telephone Encounter (Signed)
The xafaxin has been approved thru 11/22/2019. I have notified the patient and also [facility].

## 2019-11-08 NOTE — Telephone Encounter (Signed)
I have spoken to Gloria Fields and I will work on the prior authorization for the [place]. She has made an appointment for 12/22/2019 at 10:50AM.

## 2019-11-08 NOTE — Telephone Encounter (Signed)
I have submitted the prior authorization thru [employer] for patients xifaxan, DX: K58.0. Will await response.

## 2019-11-08 NOTE — Telephone Encounter (Signed)
[facility] [address] like she may need a prior Jonah Blue   Ask her to set up a visit for Jan or Feb - next available - I am ok with telehealth if she prefers

## 2019-11-10 ENCOUNTER — Encounter: Payer: Self-pay | Admitting: Psychiatry

## 2019-11-10 ENCOUNTER — Other Ambulatory Visit: Payer: Self-pay

## 2019-11-10 ENCOUNTER — Ambulatory Visit (INDEPENDENT_AMBULATORY_CARE_PROVIDER_SITE_OTHER): Payer: Medicare Other | Admitting: Psychiatry

## 2019-11-10 VITALS — BP 136/75 | HR 91

## 2019-11-10 DIAGNOSIS — F3342 Major depressive disorder, recurrent, in full remission: Secondary | ICD-10-CM

## 2019-11-10 DIAGNOSIS — F5105 Insomnia due to other mental disorder: Secondary | ICD-10-CM | POA: Diagnosis not present

## 2019-11-10 DIAGNOSIS — F988 Other specified behavioral and emotional disorders with onset usually occurring in childhood and adolescence: Secondary | ICD-10-CM

## 2019-11-10 DIAGNOSIS — F411 Generalized anxiety disorder: Secondary | ICD-10-CM

## 2019-11-10 NOTE — Progress Notes (Signed)
Gloria Fields [id number] June 01, 1946 73 y.o.  Subjective:   Patient ID:  Gloria Fields is a 73 y.o. (DOB September 05, 1946) female.  Chief Complaint:  Chief Complaint  Patient presents with  . Follow-up    Medication Management  . Anxiety    Medication Management  . ADHD    Medication Management  . Medication Refill    Hydroxyzine    Anxiety    Medication Refill Associated symptoms include abdominal pain, arthralgias and vomiting. Pertinent negatives include no weakness.   Gloria Fields presents to the office today for follow-up of depression anxiety, ADD, sleep.  Last seen in September 2019.  Trazodone was increased to 150 mg nightly for sleep and depression.  She was also given hydroxyzine 25 mg 3 times daily for anxiety.  Last seen June 2020 without med changes.  On Adderall 10 mg twice daily, buspirone 30 mg twice daily, clonazepam 0.5 mg twice daily, gabapentin 100 mg twice daily, hydroxyzine 25 3 times daily as needed, sertraline 200, trazodone 150-300 nightly for sleep  Usually takes hydroxyzine HS for sleep.  But out of it awhile and doing OK usually.  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   Very upset with politics.   Chronic GI problems run in her family too.  I'm doing ok overall.  Enjoys staying home.  Retired Pharmacist, hospital.  Jumps from one thing to next without the Adderall. Thinks she benefits from the meds.   Patient reports stable mood and denies depressed or irritable moods.  Patient denies any recent difficulty with anxiety.  Better lately with less GI problems.  Chronic intermittent GI sx.  Anxiety flares can cause sweating.  Patient denies difficulty with sleep initiation or maintenance. Denies appetite disturbance.  Patient reports that energy and motivation have been good.  Patient denies any difficulty with concentration.  Patient denies any suicidal ideation. I  don't like change.  Past Psychiatric Medication Trials: Patient reportedly been on Adderall for about 25 years.  Clonazepam 0.5, buspirone 30 twice daily, sertraline 200, trazodone, gabapentin, hydroxyzine, Lunesta, olanzapine, Abilify, Saphris, Wellbutrin, citalopram, lorazepam  Review of Systems:  Review of Systems  Gastrointestinal: Positive for abdominal pain, diarrhea and vomiting.  Musculoskeletal: Positive for arthralgias.  Neurological: Negative for tremors and weakness.    Medications: I have reviewed the patient's current medications.  Current Outpatient Medications  Medication Sig Dispense Refill  . amphetamine-dextroamphetamine (ADDERALL) 10 MG tablet 1 bid 180 tablet 0  . antiseptic oral rinse (BIOTENE) LIQD 15 mLs by Mouth Rinse route as needed for dry mouth (uses when she brushes her teeth).    . busPIRone (BUSPAR) 30 MG tablet Take 1 tablet (30 mg total) by mouth 2 (two) times daily. 180 tablet 2  . clonazePAM (KLONOPIN) 0.5 MG tablet Take 1 tablet (0.5 mg total) by mouth 2 (two) times daily. 1-2 prn 180 tablet 0  . clotrimazole-betamethasone (LOTRISONE) cream APPLY TO AFFECTED AREA TOPICALLY 3 TIMES A DAY FOR 3 MONTHS    . cycloSPORINE (RESTASIS) 0.05 % ophthalmic emulsion 1 drop 2 (two) times daily.    Marland Kitchen dexlansoprazole (DEXILANT) 60 MG capsule Take 1 capsule (60 mg total) by mouth daily. 30 capsule 6  . famotidine (PEPCID) 40 MG tablet Take 40 mg by mouth 2 (two) times daily.    Marland Kitchen gabapentin (NEURONTIN) 100 MG capsule Take 1 capsule (100 mg total) by mouth 2 (two) times daily.  180 capsule 1  . hydrOXYzine (ATARAX/VISTARIL) 25 MG tablet Take 25 mg by mouth. 1 - 3/d    . Hypromellose 0.3 % SOLN Apply 1 drop to eye 2 (two) times daily.    . Omega-3 Fatty Acids (FISH OIL) 1000 MG CAPS Take 1,000 mg by mouth 2 (two) times daily.    . ondansetron (ZOFRAN) 4 MG tablet Take 1 tablet (4 mg total) by mouth every 8 (eight) hours as needed for nausea or vomiting. 20 tablet 0  .  PREVIDENT 5000 DRY MOUTH 1.1 % GEL dental gel     . rifaximin (XIFAXAN) 550 MG TABS tablet Take 1 tablet (550 mg total) by mouth 3 (three) times daily. 42 tablet 0  . sertraline (ZOLOFT) 100 MG tablet Take 2 tablets (200 mg total) by mouth daily. 180 tablet 1  . traZODone (DESYREL) 150 MG tablet Take 1-2 tablets (150-300 mg total) by mouth daily. 180 tablet 1  . valsartan-hydrochlorothiazide (DIOVAN HCT) 160-25 MG per tablet Take 1 tablet by mouth every morning.      No current facility-administered medications for this visit.    Medication Side Effects: None  Allergies:  Allergies  Allergen Reactions  . Morphine Other (See Comments)    REACTION: paranoid    Past Medical History:  Diagnosis Date  . ADD (attention deficit disorder)   . Anemia, normocytic normochromic 12/25/2012   Mild, chronic, Hb 11-12 grams at least since 11/2006; screening studies & endoscopies unremarkable  . Anxiety   . Back pain   . Breast cancer ([place]) 2007  . Chronic kidney disease    hx of stage 3 kidney disease   . Depression   . Diverticulosis of colon (without mention of hemorrhage)   . Esophageal reflux   . H/O dehydration   . Headache    PMH  . Heart murmur   . History of blood transfusion   . History of radiation therapy   . Hypercholesterolemia   . Hypertension   . Irritable bowel syndrome   . 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 Osteopenia   . Ovarian cystic mass 07/07/2015  . Personal history of radiation therapy 2008  . PONV (postoperative nausea and vomiting)    also had severe itching   . Pyloric stenosis   . Repeated falls   . Shortness of breath dyspnea    walking distances or climbing stairs    Family History  Problem Relation Age of Onset  . Other Father        colon rupture  . Pancreatic cancer Cousin   . Diabetes Maternal Aunt   . Breast cancer Maternal Aunt   . Prostate cancer Maternal Grandfather   . Breast cancer Maternal  Aunt        X 3  . Breast cancer Maternal Aunt     Social History   Socioeconomic History  . Marital status: Divorced    Spouse name: Not on file  . Number of children: 0  . Years of education: Not on file  . Highest education level: Not on file  Occupational History  . Occupation: Retired Product manager: RETIRED  Tobacco Use  . Smoking status: Never Smoker  . Smokeless tobacco: Never Used  Substance and Sexual Activity  . Alcohol use: Yes    Alcohol/week: 0.0 standard drinks    Comment: rarely  . Drug use: No  . Sexual activity: Not on file  Other Topics Concern  .  Not on file  Social History Narrative   She is divorced. No children. She is a retired Statistician.    Lives alone.   One caffeinated beverage daily.      Social Determinants of Health   Financial Resource Strain:   . Difficulty of Paying Living Expenses: Not on file  Food Insecurity:   . Worried About Charity fundraiser in the Last Year: Not on file  . Ran Out of Food in the Last Year: Not on file  Transportation Needs:   . Lack of Transportation (Medical): Not on file  . Lack of Transportation (Non-Medical): Not on file  Physical Activity:   . Days of Exercise per Week: Not on file  . Minutes of Exercise per Session: Not on file  Stress:   . Feeling of Stress : Not on file  Social Connections:   . Frequency of Communication with Friends and Family: Not on file  . Frequency of Social Gatherings with Friends and Family: Not on file  . Attends Religious Services: Not on file  . Active Member of Clubs or Organizations: Not on file  . Attends Archivist Meetings: Not on file  . Marital Status: Not on file  Intimate Partner Violence:   . Fear of Current or Ex-Partner: Not on file  . Emotionally Abused: Not on file  . Physically Abused: Not on file  . Sexually Abused: Not on file    Past Medical History, Surgical history, Social history, and Family history were reviewed and  updated as appropriate.   Lives now with man shes dated for 20 years who moved in due to PD  Please see review of systems for further details on the patient's review from today.   Objective:   Physical Exam:  BP 136/75   Pulse 91   Physical Exam Constitutional:      General: She is not in acute distress.    Appearance: She is well-developed. She is obese.  Musculoskeletal:        General: No deformity.  Neurological:     Mental Status: She is alert and oriented to person, place, and time.     Cranial Nerves: No dysarthria.     Coordination: Coordination normal.  Psychiatric:        Attention and Perception: Attention normal. She is attentive.        Mood and Affect: Mood is anxious. Mood is not depressed. Affect is not labile, blunt, angry or inappropriate.        Speech: Speech normal. Speech is not rapid and pressured or slurred.        Behavior: Behavior normal.        Thought Content: Thought content normal. Thought content does not include homicidal or suicidal ideation. Thought content does not include homicidal or suicidal plan.        Cognition and Memory: Cognition normal.        Judgment: Judgment normal.     Comments: Insight is fair.  Mildly scattered thought.       Lab Review:     Component Value Date/Time   NA 142 01/15/2017 0901   K 4.2 01/15/2017 0901   CL 105 10/13/2015 0440   CO2 25 01/15/2017 0901   GLUCOSE 100 01/15/2017 0901   BUN 22.3 01/15/2017 0901   CREATININE 1.3 (H) 01/15/2017 0901   CALCIUM 9.3 01/15/2017 0901   PROT 6.6 01/15/2017 0901   ALBUMIN 3.6 01/15/2017 0901   AST 14 01/15/2017  0901   ALT 10 01/15/2017 0901   ALKPHOS 65 01/15/2017 0901   BILITOT 0.27 01/15/2017 0901   GFRNONAA >60 10/13/2015 0440   GFRAA >60 10/13/2015 0440       Component Value Date/Time   WBC 6.0 01/15/2017 0901   WBC 9.6 10/13/2015 0440   RBC 3.55 (L) 01/15/2017 0901   RBC 3.13 (L) 10/13/2015 0440   HGB 10.1 (L) 01/15/2017 0901   HCT 31.5 (L)  01/15/2017 0901   PLT 177 01/15/2017 0901   MCV 88.7 01/15/2017 0901   MCH 28.5 01/15/2017 0901   MCH 30.4 10/13/2015 0440   MCHC 32.1 01/15/2017 0901   MCHC 32.9 10/13/2015 0440   RDW 14.1 01/15/2017 0901   LYMPHSABS 2.4 01/15/2017 0901   MONOABS 0.6 01/15/2017 0901   EOSABS 0.5 01/15/2017 0901   BASOSABS 0.0 01/15/2017 0901    No results found for: POCLITH, LITHIUM   No results found for: PHENYTOIN, PHENOBARB, VALPROATE, CBMZ   .res Assessment: Plan:    Gloria Fields was seen today for follow-up, anxiety, adhd and medication refill.  Diagnoses and all orders for this visit:  Generalized anxiety disorder  Recurrent major depression in full remission ([place])  Attention deficit disorder, unspecified hyperactivity presence  Insomnia due to mental condition    On the same meds for several years except for the hydroxyzine added at last visit.  We discussed the short-term risks associated with benzodiazepines including sedation and increased fall risk among others.  Discussed long-term side effect risk including dependence, potential withdrawal symptoms, and the potential eventual dose-related risk of dementia. Also cognitive risks of hydroxyzine.    Supportive therapy dealing with still occ traumatic NM of prior hospitalization.  Had NM with trazodone alone and hydroxyzine added seemed to help that. Don't take hydroxyzine if not necessary.     Benefit and tolerating meds.  No clear med changes indicated but encouraged her to limit sedatives as much as possible and esp with the Adderall.  Supportive therapy dealing with BF with PD  FU 6 mos  Lynder Parents, MD, DFAPA   Please see After Visit Summary for patient specific instructions.  Future Appointments  Date Time Provider [place]  12/22/2019 10:50 AM Gatha Mayer, MD [facility]    No orders of the defined types were placed in this encounter.   -------------------------------

## 2019-12-08 ENCOUNTER — Other Ambulatory Visit: Payer: Self-pay | Admitting: Psychiatry

## 2019-12-22 ENCOUNTER — Ambulatory Visit: Payer: Medicare Other | Admitting: Internal Medicine

## 2020-01-14 ENCOUNTER — Other Ambulatory Visit: Payer: Self-pay | Admitting: Psychiatry

## 2020-02-07 ENCOUNTER — Telehealth: Payer: Self-pay | Admitting: Psychiatry

## 2020-02-07 ENCOUNTER — Other Ambulatory Visit: Payer: Self-pay

## 2020-02-07 DIAGNOSIS — F988 Other specified behavioral and emotional disorders with onset usually occurring in childhood and adolescence: Secondary | ICD-10-CM

## 2020-02-07 MED ORDER — AMPHETAMINE-DEXTROAMPHETAMINE 10 MG PO TABS: ORAL_TABLET | ORAL | 0 refills | Status: DC

## 2020-02-07 MED ORDER — CLONAZEPAM 0.5 MG PO TABS: 0.5000 mg | ORAL_TABLET | Freq: Two times a day (BID) | ORAL | 0 refills | Status: DC

## 2020-02-07 NOTE — Telephone Encounter (Signed)
Pt would like a refill on Adderall, Clonazepam. [facility] [address].

## 2020-02-07 NOTE — Telephone Encounter (Signed)
Last refill 10/27/2019 for both #180 Both pended for Dr. Clovis Pu to submit

## 2020-04-19 ENCOUNTER — Other Ambulatory Visit: Payer: Self-pay

## 2020-04-19 ENCOUNTER — Telehealth: Payer: Self-pay | Admitting: Psychiatry

## 2020-04-19 DIAGNOSIS — F988 Other specified behavioral and emotional disorders with onset usually occurring in childhood and adolescence: Secondary | ICD-10-CM

## 2020-04-19 NOTE — Telephone Encounter (Signed)
Pt would like a refill on her Adderall and Klonopin. [facility] [address].

## 2020-04-19 NOTE — Telephone Encounter (Signed)
Last refill Adderall 02/09/2020 for 90 day Last refill Klonopin 02/07/2020 #60 for 30 day  Pended for Dr. Clovis Pu to review and submit

## 2020-04-20 MED ORDER — CLONAZEPAM 0.5 MG PO TABS: 0.5000 mg | ORAL_TABLET | Freq: Two times a day (BID) | ORAL | 0 refills | Status: DC

## 2020-04-20 MED ORDER — AMPHETAMINE-DEXTROAMPHETAMINE 10 MG PO TABS: ORAL_TABLET | ORAL | 0 refills | Status: DC

## 2020-05-10 ENCOUNTER — Encounter: Payer: Self-pay | Admitting: Psychiatry

## 2020-05-10 ENCOUNTER — Other Ambulatory Visit: Payer: Self-pay

## 2020-05-10 ENCOUNTER — Ambulatory Visit (INDEPENDENT_AMBULATORY_CARE_PROVIDER_SITE_OTHER): Payer: Medicare PPO | Admitting: Psychiatry

## 2020-05-10 DIAGNOSIS — F4001 Agoraphobia with panic disorder: Secondary | ICD-10-CM

## 2020-05-10 DIAGNOSIS — F411 Generalized anxiety disorder: Secondary | ICD-10-CM | POA: Diagnosis not present

## 2020-05-10 DIAGNOSIS — F5105 Insomnia due to other mental disorder: Secondary | ICD-10-CM | POA: Diagnosis not present

## 2020-05-10 DIAGNOSIS — F3342 Major depressive disorder, recurrent, in full remission: Secondary | ICD-10-CM

## 2020-05-10 DIAGNOSIS — F988 Other specified behavioral and emotional disorders with onset usually occurring in childhood and adolescence: Secondary | ICD-10-CM

## 2020-05-10 MED ORDER — GABAPENTIN 100 MG PO CAPS: 100.0000 mg | ORAL_CAPSULE | Freq: Two times a day (BID) | ORAL | 1 refills | Status: DC

## 2020-05-10 MED ORDER — CLONAZEPAM 0.5 MG PO TABS: 0.5000 mg | ORAL_TABLET | Freq: Two times a day (BID) | ORAL | 0 refills | Status: DC

## 2020-05-10 MED ORDER — BUSPIRONE HCL 30 MG PO TABS: 30.0000 mg | ORAL_TABLET | Freq: Two times a day (BID) | ORAL | 2 refills | Status: DC

## 2020-05-10 MED ORDER — TRAZODONE HCL 150 MG PO TABS: 150.0000 mg | ORAL_TABLET | Freq: Every day | ORAL | 1 refills | Status: DC

## 2020-05-10 MED ORDER — SERTRALINE HCL 100 MG PO TABS: 200.0000 mg | ORAL_TABLET | Freq: Every day | ORAL | 1 refills | Status: DC

## 2020-05-10 MED ORDER — AMPHETAMINE-DEXTROAMPHETAMINE 10 MG PO TABS: 10.0000 mg | ORAL_TABLET | Freq: Three times a day (TID) | ORAL | 0 refills | Status: DC

## 2020-05-10 NOTE — Progress Notes (Signed)
Gloria Fields [id number] 1946-07-28 74 y.o.  Subjective:   Patient ID:  Gloria Fields is a 74 y.o. (DOB October 12, 1946) female.  Chief Complaint:  Chief Complaint  Patient presents with  . Follow-up    Mood and meds  . Anxiety  . ADHD    Anxiety    Medication Refill Associated symptoms include abdominal pain, arthralgias and vomiting. Pertinent negatives include no weakness.   Gloria Fields presents to the office today for follow-up of depression anxiety, ADD, sleep.  Last seen in September 2019.  Trazodone was increased to 150 mg nightly for sleep and depression.  She was also given hydroxyzine 25 mg 3 times daily for anxiety.  Last seen June 2020 without med changes.  On Adderall 10 mg twice daily, buspirone 30 mg twice daily, clonazepam 0.5 mg twice daily, gabapentin 100 mg twice daily, hydroxyzine 25 3 times daily as needed, sertraline 200, trazodone 150-300 nightly for sleep  Usually takes hydroxyzine HS for sleep.  But out of it awhile and doing OK usually.  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   Very upset with politics.   Chronic GI problems run in her family too.  I'm doing ok overall.  Enjoys staying home.  Retired Pharmacist, hospital.  Jumps from one thing to next without the Adderall. Thinks she benefits from the meds.   Patient reports stable mood and denies depressed or irritable moods.  Patient denies any recent difficulty with anxiety.  Better lately with less GI problems.  Chronic intermittent GI sx.  Anxiety flares can cause sweating.  Patient denies difficulty with sleep initiation or maintenance. Denies appetite disturbance.  Patient reports that energy and motivation have been good.  Patient denies any difficulty with concentration.  Patient denies any suicidal ideation. I don't like change.  05/09/20 appt with following noted: Tremendous anxiety about leaving the  house and needs clonazepam.  Uses prn. Average one daily or for sleep. Asks to increase Adderall to TID.  Wears off at 3 pm and can't do much.   Wants to clean out her house and be more productive.  Getting things done with Adderall in morning. House looks like hoarder house LT.   No kids, no husband.  Just ended 38 year rel with cruel man with PD.  He lost job house and car sending money to overseas woman.   Residual depression, anxiety, sleep problems with erratic schedule.  Procrastinates. Tolerates meds.  Past Psychiatric Medication Trials: Patient reportedly been on Adderall for about 25 years.  Clonazepam 0.5, buspirone 30 twice daily, sertraline 200, trazodone, gabapentin, hydroxyzine, Lunesta, olanzapine, Abilify, Saphris, Wellbutrin, citalopram, lorazepam  Review of Systems:  Review of Systems  Gastrointestinal: Positive for abdominal pain, diarrhea and vomiting.  Musculoskeletal: Positive for arthralgias.  Neurological: Negative for tremors and weakness.  GI problems diet related in part  Medications: I have reviewed the patient's current medications.  Current Outpatient Medications  Medication Sig Dispense Refill  . amphetamine-dextroamphetamine (ADDERALL) 10 MG tablet Take 1 tablet (10 mg total) by mouth in the morning, at noon, and at bedtime. 1 bid 270 tablet 0  . antiseptic oral rinse (BIOTENE) LIQD 15 mLs by Mouth Rinse route as needed for dry mouth (uses when she brushes her teeth).    . busPIRone (BUSPAR) 30 MG tablet Take 1 tablet (30 mg total) by mouth 2 (two) times daily. 180 tablet 2  .  clonazePAM (KLONOPIN) 0.5 MG tablet Take 1 tablet (0.5 mg total) by mouth 2 (two) times daily. 1-2 prn 180 tablet 0  . clotrimazole-betamethasone (LOTRISONE) cream APPLY TO AFFECTED AREA TOPICALLY 3 TIMES A DAY FOR 3 MONTHS    . cycloSPORINE (RESTASIS) 0.05 % ophthalmic emulsion 1 drop 2 (two) times daily.    Marland Kitchen dexlansoprazole (DEXILANT) 60 MG capsule Take 1 capsule (60 mg total) by  mouth daily. 30 capsule 6  . famotidine (PEPCID) 40 MG tablet Take 40 mg by mouth 2 (two) times daily.    Marland Kitchen gabapentin (NEURONTIN) 100 MG capsule Take 1 capsule (100 mg total) by mouth 2 (two) times daily. 180 capsule 1  . Hypromellose 0.3 % SOLN Apply 1 drop to eye 2 (two) times daily.    . Omega-3 Fatty Acids (FISH OIL) 1000 MG CAPS Take 1,000 mg by mouth 2 (two) times daily.    . ondansetron (ZOFRAN) 4 MG tablet Take 1 tablet (4 mg total) by mouth every 8 (eight) hours as needed for nausea or vomiting. 20 tablet 0  . PREVIDENT 5000 DRY MOUTH 1.1 % GEL dental gel     . rifaximin (XIFAXAN) 550 MG TABS tablet Take 1 tablet (550 mg total) by mouth 3 (three) times daily. 42 tablet 0  . sertraline (ZOLOFT) 100 MG tablet Take 2 tablets (200 mg total) by mouth daily. 180 tablet 1  . traZODone (DESYREL) 150 MG tablet Take 1-2 tablets (150-300 mg total) by mouth daily. 180 tablet 1  . valsartan-hydrochlorothiazide (DIOVAN HCT) 160-25 MG per tablet Take 1 tablet by mouth every morning.      No current facility-administered medications for this visit.    Medication Side Effects: None  Allergies:  Allergies  Allergen Reactions  . Morphine Other (See Comments)    REACTION: paranoid    Past Medical History:  Diagnosis Date  . ADD (attention deficit disorder)   . Anemia, normocytic normochromic 12/25/2012   Mild, chronic, Hb 11-12 grams at least since 11/2006; screening studies & endoscopies unremarkable  . Anxiety   . Back pain   . Breast cancer ([place]) 2007  . Chronic kidney disease    hx of stage 3 kidney disease   . Depression   . Diverticulosis of colon (without mention of hemorrhage)   . Esophageal reflux   . H/O dehydration   . Headache    PMH  . Heart murmur   . History of blood transfusion   . History of radiation therapy   . Hypercholesterolemia   . Hypertension   . Irritable bowel syndrome   . 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 ([place] [address]) 12/24/2013  .  Osteopenia   . Ovarian cystic mass 07/07/2015  . Personal history of radiation therapy 2008  . PONV (postoperative nausea and vomiting)    also had severe itching   . Pyloric stenosis   . Repeated falls   . Shortness of breath dyspnea    walking distances or climbing stairs    Family History  Problem Relation Age of Onset  . Other Father        colon rupture  . Pancreatic cancer Cousin   . Diabetes Maternal Aunt   . Breast cancer Maternal Aunt   . Prostate cancer Maternal Grandfather   . Breast cancer Maternal Aunt        X 3  . Breast cancer Maternal Aunt     Social History   Socioeconomic History  . Marital status: Divorced  Spouse name: Not on file  . Number of children: 0  . Years of education: Not on file  . Highest education level: Not on file  Occupational History  . Occupation: Retired Product manager: RETIRED  Tobacco Use  . Smoking status: Never Smoker  . Smokeless tobacco: Never Used  Vaping Use  . Vaping Use: Never used  Substance and Sexual Activity  . Alcohol use: Yes    Alcohol/week: 0.0 standard drinks    Comment: rarely  . Drug use: No  . Sexual activity: Not on file  Other Topics Concern  . Not on file  Social History Narrative   She is divorced. No children. She is a retired Statistician.    Lives alone.   One caffeinated beverage daily.      Social Determinants of Health   Financial Resource Strain:   . Difficulty of Paying Living Expenses:   Food Insecurity:   . Worried About Charity fundraiser in the Last Year:   . Arboriculturist in the Last Year:   Transportation Needs:   . Film/video editor (Medical):   Marland Kitchen Lack of Transportation (Non-Medical):   Physical Activity:   . Days of Exercise per Week:   . Minutes of Exercise per Session:   Stress:   . Feeling of Stress :   Social Connections:   . Frequency of Communication with Friends and Family:   . Frequency of Social Gatherings with Friends and Family:   .  Attends Religious Services:   . Active Member of Clubs or Organizations:   . Attends Archivist Meetings:   Marland Kitchen Marital Status:   Intimate Partner Violence:   . Fear of Current or Ex-Partner:   . Emotionally Abused:   Marland Kitchen Physically Abused:   . Sexually Abused:     Past Medical History, Surgical history, Social history, and Family history were reviewed and updated as appropriate.   Lives now with man shes dated for 20 years who moved in due to PD  Please see review of systems for further details on the patient's review from today.   Objective:   Physical Exam:  There were no vitals taken for this visit.  Physical Exam Constitutional:      General: She is not in acute distress.    Appearance: She is well-developed. She is obese.  Musculoskeletal:        General: No deformity.  Neurological:     Mental Status: She is alert and oriented to person, place, and time.     Cranial Nerves: No dysarthria.     Coordination: Coordination normal.  Psychiatric:        Attention and Perception: She is inattentive.        Mood and Affect: Mood is anxious. Mood is not depressed. Affect is not labile, blunt, angry or inappropriate.        Speech: Speech normal. Speech is not rapid and pressured or slurred.        Behavior: Behavior normal.        Thought Content: Thought content normal. Thought content does not include homicidal or suicidal ideation. Thought content does not include homicidal or suicidal plan.        Cognition and Memory: Cognition normal.        Judgment: Judgment normal.     Comments: Insight is fair.  Mildly scattered thought.   sitational depression     Lab Review:  Component Value Date/Time   NA 142 01/15/2017 0901   K 4.2 01/15/2017 0901   CL 105 10/13/2015 0440   CO2 25 01/15/2017 0901   GLUCOSE 100 01/15/2017 0901   BUN 22.3 01/15/2017 0901   CREATININE 1.3 (H) 01/15/2017 0901   CALCIUM 9.3 01/15/2017 0901   PROT 6.6 01/15/2017 0901   ALBUMIN  3.6 01/15/2017 0901   AST 14 01/15/2017 0901   ALT 10 01/15/2017 0901   ALKPHOS 65 01/15/2017 0901   BILITOT 0.27 01/15/2017 0901   GFRNONAA >60 10/13/2015 0440   GFRAA >60 10/13/2015 0440       Component Value Date/Time   WBC 6.0 01/15/2017 0901   WBC 9.6 10/13/2015 0440   RBC 3.55 (L) 01/15/2017 0901   RBC 3.13 (L) 10/13/2015 0440   HGB 10.1 (L) 01/15/2017 0901   HCT 31.5 (L) 01/15/2017 0901   PLT 177 01/15/2017 0901   MCV 88.7 01/15/2017 0901   MCH 28.5 01/15/2017 0901   MCH 30.4 10/13/2015 0440   MCHC 32.1 01/15/2017 0901   MCHC 32.9 10/13/2015 0440   RDW 14.1 01/15/2017 0901   LYMPHSABS 2.4 01/15/2017 0901   MONOABS 0.6 01/15/2017 0901   EOSABS 0.5 01/15/2017 0901   BASOSABS 0.0 01/15/2017 0901    No results found for: POCLITH, LITHIUM   No results found for: PHENYTOIN, PHENOBARB, VALPROATE, CBMZ   .res Assessment: Plan:    Gloria Fields was seen today for follow-up, anxiety and adhd.  Diagnoses and all orders for this visit:  Recurrent major depression in full remission ([place]) -     sertraline (ZOLOFT) 100 MG tablet; Take 2 tablets (200 mg total) by mouth daily.  Generalized anxiety disorder -     busPIRone (BUSPAR) 30 MG tablet; Take 1 tablet (30 mg total) by mouth 2 (two) times daily. -     gabapentin (NEURONTIN) 100 MG capsule; Take 1 capsule (100 mg total) by mouth 2 (two) times daily. -     sertraline (ZOLOFT) 100 MG tablet; Take 2 tablets (200 mg total) by mouth daily.  Panic disorder with agoraphobia -     clonazePAM (KLONOPIN) 0.5 MG tablet; Take 1 tablet (0.5 mg total) by mouth 2 (two) times daily. 1-2 prn -     gabapentin (NEURONTIN) 100 MG capsule; Take 1 capsule (100 mg total) by mouth 2 (two) times daily. -     sertraline (ZOLOFT) 100 MG tablet; Take 2 tablets (200 mg total) by mouth daily.  Attention deficit disorder, unspecified hyperactivity presence -     amphetamine-dextroamphetamine (ADDERALL) 10 MG tablet; Take 1 tablet (10 mg total) by mouth  in the morning, at noon, and at bedtime. 1 bid  Insomnia due to mental condition -     traZODone (DESYREL) 150 MG tablet; Take 1-2 tablets (150-300 mg total) by mouth daily.    On the same meds for several years except for the hydroxyzine added and then stopped.  We discussed the short-term risks associated with benzodiazepines including sedation and increased fall risk among others.  Discussed long-term side effect risk including dependence, potential withdrawal symptoms, and the potential eventual dose-related risk of dementia. Also cognitive risks of hydroxyzine and stopped it.  Not ideal to take stimulants and clonazepam but needs BZ to leave the house.  Fearful.  Adderall helps function. OK increase Adderall to 10 TID to prolong productivity period which could also help mood. 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   Discussed potential benefits, risks, and side effects of  stimulants with patient to include increased heart rate, palpitations, insomnia, increased anxiety, increased irritability, or decreased appetite.  Instructed patient to contact office if experiencing any significant tolerability issues.  Supportive therapy dealing with broken relationship and loneliness.  Disc hoarding and pushing herself through procrastination and hoarding.   Trazodone helps sleep.  Benefit and tolerating meds.  Supportive therapy dealing with loss of BF.    FU 6 mos  Lynder Parents, MD, DFAPA   Please see After Visit Summary for patient specific instructions.  No future appointments.  No orders of the defined types were placed in this encounter.   -------------------------------

## 2020-06-07 ENCOUNTER — Other Ambulatory Visit: Payer: Self-pay | Admitting: Internal Medicine

## 2020-06-07 ENCOUNTER — Telehealth: Payer: Self-pay

## 2020-06-07 MED ORDER — RIFAXIMIN 550 MG PO TABS: 550.0000 mg | ORAL_TABLET | Freq: Three times a day (TID) | ORAL | 0 refills | Status: DC

## 2020-06-07 NOTE — Telephone Encounter (Signed)
I confirmed pharmacy if we send it in. [facility]

## 2020-06-07 NOTE — Telephone Encounter (Signed)
Refill xifaxan 550 mg tid x 14 days  If that fails to help as in past she is to call back

## 2020-06-07 NOTE — Telephone Encounter (Signed)
Pt is requesting a medication refill on her Xifaxan

## 2020-06-07 NOTE — Telephone Encounter (Signed)
I spoke with Gloria Fields and she started Monday night with diarrhea every 15 minutes and it continued into Tues. , it is better now. No fever, she had nausea and vomiting with a sulfa smell and taste. She is requesting the xifaxan as it really helps her diarrhea for a long time she said. I told her she missed her January appointment and she said she didn't know she had one then.  Please advise Sir, thank you for your time.

## 2020-06-07 NOTE — Telephone Encounter (Signed)
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 Patient aware of this.   Just got a fax with approval thru 09/05/2020. I called and told Jacqlyn Larsen and I notified the pharmacy via fax.

## 2020-06-07 NOTE — Telephone Encounter (Signed)
Patient informed and rx sent. 

## 2020-11-08 ENCOUNTER — Other Ambulatory Visit: Payer: Self-pay

## 2020-11-08 ENCOUNTER — Encounter: Payer: Self-pay | Admitting: Psychiatry

## 2020-11-08 ENCOUNTER — Ambulatory Visit (INDEPENDENT_AMBULATORY_CARE_PROVIDER_SITE_OTHER): Payer: Medicare PPO | Admitting: Psychiatry

## 2020-11-08 DIAGNOSIS — F5105 Insomnia due to other mental disorder: Secondary | ICD-10-CM | POA: Diagnosis not present

## 2020-11-08 DIAGNOSIS — F4001 Agoraphobia with panic disorder: Secondary | ICD-10-CM | POA: Diagnosis not present

## 2020-11-08 DIAGNOSIS — F988 Other specified behavioral and emotional disorders with onset usually occurring in childhood and adolescence: Secondary | ICD-10-CM

## 2020-11-08 DIAGNOSIS — F3342 Major depressive disorder, recurrent, in full remission: Secondary | ICD-10-CM

## 2020-11-08 DIAGNOSIS — F411 Generalized anxiety disorder: Secondary | ICD-10-CM | POA: Diagnosis not present

## 2020-11-08 MED ORDER — TRAZODONE HCL 150 MG PO TABS: 150.0000 mg | ORAL_TABLET | Freq: Every day | ORAL | 1 refills | Status: DC

## 2020-11-08 MED ORDER — BUSPIRONE HCL 30 MG PO TABS: 30.0000 mg | ORAL_TABLET | Freq: Two times a day (BID) | ORAL | 2 refills | Status: DC

## 2020-11-08 MED ORDER — SERTRALINE HCL 100 MG PO TABS: 200.0000 mg | ORAL_TABLET | Freq: Every day | ORAL | 1 refills | Status: DC

## 2020-11-08 MED ORDER — GABAPENTIN 100 MG PO CAPS: 100.0000 mg | ORAL_CAPSULE | Freq: Two times a day (BID) | ORAL | 1 refills | Status: DC

## 2020-11-08 MED ORDER — AMPHETAMINE-DEXTROAMPHETAMINE 10 MG PO TABS: 10.0000 mg | ORAL_TABLET | Freq: Three times a day (TID) | ORAL | 0 refills | Status: DC

## 2020-11-08 MED ORDER — CLONAZEPAM 0.5 MG PO TABS: 0.5000 mg | ORAL_TABLET | Freq: Two times a day (BID) | ORAL | 0 refills | Status: DC

## 2020-11-08 NOTE — Progress Notes (Addendum)
Gloria Fields [id number] Sep 09, 1946 74 y.o.  Subjective:   Patient ID:  Gloria Fields is a 74 y.o. (DOB [birth year]) female.  Chief Complaint:  Chief Complaint  Patient presents with  . Follow-up  . Anxiety  . ADHD    Anxiety Patient reports no palpitations.    Medication Refill Associated symptoms include abdominal pain, arthralgias and vomiting. Pertinent negatives include no weakness.   Gloria Fields presents to the office today for follow-up of depression anxiety, ADD, sleep.  Last seen in September 2019.  Trazodone was increased to 150 mg nightly for sleep and depression.  She was also given hydroxyzine 25 mg 3 times daily for anxiety.  Last seen June 2020 without med changes.  On Adderall 10 mg twice daily, buspirone 30 mg twice daily, clonazepam 0.5 mg twice daily, gabapentin 100 mg twice daily, hydroxyzine 25 3 times daily as needed, sertraline 200, trazodone 150-300 nightly for sleep  Usually takes hydroxyzine HS for sleep.  But out of it awhile and doing OK usually.  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   Very upset with politics.   Chronic GI problems run in her family too.  I'm doing ok overall.  Enjoys staying home.  Retired Pharmacist, hospital.  Jumps from one thing to next without the Adderall. Thinks she benefits from the meds.   Patient reports stable mood and denies depressed or irritable moods.  Patient denies any recent difficulty with anxiety.  Better lately with less GI problems.  Chronic intermittent GI sx.  Anxiety flares can cause sweating.  Patient denies difficulty with sleep initiation or maintenance. Denies appetite disturbance.  Patient reports that energy and motivation have been good.  Patient denies any difficulty with concentration.  Patient denies any suicidal ideation. I don't like change.  05/09/20 appt with following noted: Tremendous anxiety about  leaving the house and needs clonazepam.  Uses prn. Average one daily or for sleep. Asks to increase Adderall to TID.  Wears off at 3 pm and can't do much.   Wants to clean out her house and be more productive.  Getting things done with Adderall in morning. House looks like hoarder house LT.   No kids, no husband.  Just ended 41 year rel with cruel man with PD.  He lost job house and car sending money to overseas woman.   Residual depression, anxiety, sleep problems with erratic schedule.  Procrastinates. Tolerates meds. Plan: OK increase Adderall to 10 TID to prolong productivity period which could also help mood.  11/08/2020 appointment with the following noted: Increased Adderall TID helped productivity.  Organizes closests but there's stuff all over the house and working on it.   Clonazepam variable.  Needs it to go out places. But some days don't take it. Still takes sertraline 200 and trazodone 150-300.   Sleep is OK.  Occ NM less often. 5 cats and a dog. Some depression but manageable.  Anxiety episodic. Tolerating meds.  Ongoing GI problems.  Takes antibiotic for it with benefit.  Past Psychiatric Medication Trials: Patient reportedly been on Adderall for about 25 years.   Clonazepam 0.5, lorazepam buspirone 30 twice daily, sertraline 200,  Wellbutrin, citalopram, trazodone, gabapentin, hydroxyzine, Lunesta,  olanzapine, Abilify, Saphris,   Review of Systems:  Review of Systems  Cardiovascular: Negative for palpitations.  Gastrointestinal: Positive for abdominal pain, diarrhea and vomiting.  Musculoskeletal: Positive for arthralgias.  Neurological:  Negative for tremors and weakness.  GI problems diet related in part  Medications: I have reviewed the patient's current medications.  Current Outpatient Medications  Medication Sig Dispense Refill  . antiseptic oral rinse (BIOTENE) LIQD 15 mLs by Mouth Rinse route as needed for dry mouth (uses when she brushes her teeth).    .  clotrimazole-betamethasone (LOTRISONE) cream APPLY TO AFFECTED AREA TOPICALLY 3 TIMES A DAY FOR 3 MONTHS    . cycloSPORINE (RESTASIS) 0.05 % ophthalmic emulsion 1 drop 2 (two) times daily.    Marland Kitchen dexlansoprazole (DEXILANT) 60 MG capsule Take 1 capsule (60 mg total) by mouth daily. 30 capsule 6  . famotidine (PEPCID) 40 MG tablet Take 40 mg by mouth 2 (two) times daily.    . Hypromellose 0.3 % SOLN Apply 1 drop to eye 2 (two) times daily.    . Omega-3 Fatty Acids (FISH OIL) 1000 MG CAPS Take 1,000 mg by mouth 2 (two) times daily.    . ondansetron (ZOFRAN) 4 MG tablet Take 1 tablet (4 mg total) by mouth every 8 (eight) hours as needed for nausea or vomiting. 20 tablet 0  . PREVIDENT 5000 DRY MOUTH 1.1 % GEL dental gel     . rifaximin (XIFAXAN) 550 MG TABS tablet Take 1 tablet (550 mg total) by mouth 3 (three) times daily. 42 tablet 0  . valsartan-hydrochlorothiazide (DIOVAN-HCT) 160-25 MG tablet Take 1 tablet by mouth every morning.    Marland Kitchen amphetamine-dextroamphetamine (ADDERALL) 10 MG tablet Take 1 tablet (10 mg total) by mouth in the morning, at noon, and at bedtime. 1 bid 270 tablet 0  . busPIRone (BUSPAR) 30 MG tablet Take 1 tablet (30 mg total) by mouth 2 (two) times daily. 180 tablet 2  . clonazePAM (KLONOPIN) 0.5 MG tablet Take 1 tablet (0.5 mg total) by mouth 2 (two) times daily. 1-2 prn 180 tablet 0  . gabapentin (NEURONTIN) 100 MG capsule Take 1 capsule (100 mg total) by mouth 2 (two) times daily. 180 capsule 1  . sertraline (ZOLOFT) 100 MG tablet Take 2 tablets (200 mg total) by mouth daily. 180 tablet 1  . traZODone (DESYREL) 150 MG tablet Take 1-2 tablets (150-300 mg total) by mouth daily. 180 tablet 1   No current facility-administered medications for this visit.    Medication Side Effects: None  Allergies:  Allergies  Allergen Reactions  . Morphine Other (See Comments)    REACTION: paranoid    Past Medical History:  Diagnosis Date  . ADD (attention deficit disorder)   .  Anemia, normocytic normochromic 12/25/2012   Mild, chronic, Hb 11-12 grams at least since 11/2006; screening studies & endoscopies unremarkable  . Anxiety   . Back pain   . Breast cancer ([place]) 2007  . Chronic kidney disease    hx of stage 3 kidney disease   . Depression   . Diverticulosis of colon (without mention of hemorrhage)   . Esophageal reflux   . H/O dehydration   . Headache    PMH  . Heart murmur   . History of blood transfusion   . History of radiation therapy   . Hypercholesterolemia   . Hypertension   . Irritable bowel syndrome   . 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 Osteopenia   . Ovarian cystic mass 07/07/2015  . Personal history of radiation therapy 2008  . PONV (postoperative nausea and vomiting)    also had severe itching   . Pyloric stenosis   .  Repeated falls   . Shortness of breath dyspnea    walking distances or climbing stairs    Family History  Problem Relation Age of Onset  . Other Father        colon rupture  . Pancreatic cancer Cousin   . Diabetes Maternal Aunt   . Breast cancer Maternal Aunt   . Prostate cancer Maternal Grandfather   . Breast cancer Maternal Aunt        X 3  . Breast cancer Maternal Aunt     Social History   Socioeconomic History  . Marital status: Divorced    Spouse name: Not on file  . Number of children: 0  . Years of education: Not on file  . Highest education level: Not on file  Occupational History  . Occupation: Retired Product manager: RET[employer]  Tobacco Use  . Smoking status: Never Smoker  . Smokeless tobacco: Never Used  Vaping Use  . Vaping Use: Never used  Substance and Sexual Activity  . Alcohol use: Yes    Alcohol/week: 0.0 standard drinks    Comment: rarely  . Drug use: No  . Sexual activity: Not on file  Other Topics Concern  . Not on file  Social History Narrative   She is divorced. No children. She is a retired Statistician.    Lives  alone.   One caffeinated beverage daily.      Social Determinants of Health   Financial Resource Strain: Not on file  Food Insecurity: Not on file  Transportation Needs: Not on file  Physical Activity: Not on file  Stress: Not on file  Social Connections: Not on file  Intimate Partner Violence: Not on file    Past Medical History, Surgical history, Social history, and Family history were reviewed and updated as appropriate.   Lives now with man shes dated for 20 years who moved in due to PD  Please see review of systems for further details on the patient's review from today.   Objective:   Physical Exam:  There were no vitals taken for this visit.  Physical Exam Constitutional:      General: She is not in acute distress.    Appearance: She is well-developed. She is obese.  Musculoskeletal:        General: No deformity.  Neurological:     Mental Status: She is alert and oriented to person, place, and time.     Cranial Nerves: No dysarthria.     Coordination: Coordination normal.  Psychiatric:        Attention and Perception: She is inattentive.        Mood and Affect: Mood is anxious and depressed. Affect is not labile, blunt, angry or inappropriate.        Speech: Speech normal. Speech is not rapid and pressured or slurred.        Behavior: Behavior normal.        Thought Content: Thought content normal. Thought content does not include homicidal or suicidal ideation. Thought content does not include homicidal or suicidal plan.        Cognition and Memory: Cognition normal.        Judgment: Judgment normal.     Comments: Insight is fair.    sitational depression generally mild.     Lab Review:     Component Value Date/Time   NA 142 01/15/2017 0901   K 4.2 01/15/2017 0901   CL 105 10/13/2015 0440   CO2  25 01/15/2017 0901   GLUCOSE 100 01/15/2017 0901   BUN 22.3 01/15/2017 0901   CREATININE 1.3 (H) 01/15/2017 0901   CALCIUM 9.3 01/15/2017 0901   PROT 6.6  01/15/2017 0901   ALBUMIN 3.6 01/15/2017 0901   AST 14 01/15/2017 0901   ALT 10 01/15/2017 0901   ALKPHOS 65 01/15/2017 0901   BILITOT 0.27 01/15/2017 0901   GFRNONAA >60 10/13/2015 0440   GFRAA >60 10/13/2015 0440       Component Value Date/Time   WBC 6.0 01/15/2017 0901   WBC 9.6 10/13/2015 0440   RBC 3.55 (L) 01/15/2017 0901   RBC 3.13 (L) 10/13/2015 0440   HGB 10.1 (L) 01/15/2017 0901   HCT 31.5 (L) 01/15/2017 0901   PLT 177 01/15/2017 0901   MCV 88.7 01/15/2017 0901   MCH 28.5 01/15/2017 0901   MCH 30.4 10/13/2015 0440   MCHC 32.1 01/15/2017 0901   MCHC 32.9 10/13/2015 0440   RDW 14.1 01/15/2017 0901   LYMPHSABS 2.4 01/15/2017 0901   MONOABS 0.6 01/15/2017 0901   EOSABS 0.5 01/15/2017 0901   BASOSABS 0.0 01/15/2017 0901    No results found for: POCLITH, LITHIUM   No results found for: PHENYTOIN, PHENOBARB, VALPROATE, CBMZ   .res Assessment: Plan:    Tammey was seen today for follow-up, anxiety and adhd.  Diagnoses and all orders for this visit:  Recurrent major depression in full remission ([place]) -     sertraline (ZOLOFT) 100 MG tablet; Take 2 tablets (200 mg total) by mouth daily.  Attention deficit disorder, unspecified hyperactivity presence -     amphetamine-dextroamphetamine (ADDERALL) 10 MG tablet; Take 1 tablet (10 mg total) by mouth in the morning, at noon, and at bedtime. 1 bid  Panic disorder with agoraphobia -     clonazePAM (KLONOPIN) 0.5 MG tablet; Take 1 tablet (0.5 mg total) by mouth 2 (two) times daily. 1-2 prn -     gabapentin (NEURONTIN) 100 MG capsule; Take 1 capsule (100 mg total) by mouth 2 (two) times daily. -     sertraline (ZOLOFT) 100 MG tablet; Take 2 tablets (200 mg total) by mouth daily.  Generalized anxiety disorder -     busPIRone (BUSPAR) 30 MG tablet; Take 1 tablet (30 mg total) by mouth 2 (two) times daily. -     gabapentin (NEURONTIN) 100 MG capsule; Take 1 capsule (100 mg total) by mouth 2 (two) times daily. -      sertraline (ZOLOFT) 100 MG tablet; Take 2 tablets (200 mg total) by mouth daily.  Insomnia due to mental condition -     traZODone (DESYREL) 150 MG tablet; Take 1-2 tablets (150-300 mg total) by mouth daily.    We discussed the short-term risks associated with benzodiazepines including sedation and increased fall risk among others.  Discussed long-term side effect risk including dependence, potential withdrawal symptoms, and the potential eventual dose-related risk of dementia.  Not ideal to take stimulants and clonazepam but needs BZ to leave the house.  Fearful.  Adderall helps function. OK increase Adderall to 10 TID to prolong productivity period which could also help mood. 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  Nearly died there and had delirium.  Resolved mostly except some anxierty.  Discussed potential benefits, risks, and side effects of stimulants with patient to include increased heart rate, palpitations, insomnia, increased anxiety, increased irritability, or decreased appetite.  Instructed patient to contact office if experiencing any significant tolerability issues.  Supportive therapy dealing with broken relationship  and loneliness.  Disc hoarding and pushing herself through procrastination and hoarding.  Trazodone helps sleep. Continue sertraline 200 mg daily.  Benefit and tolerating meds.  Supportive therapy dealing with loss of BF.    FU 6 mos  Lynder Parents, MD, DFAPA   Please see After Visit Summary for patient specific instructions.  No future appointments.  No orders of the defined types were placed in this encounter.   -------------------------------

## 2020-12-15 ENCOUNTER — Other Ambulatory Visit: Payer: Self-pay | Admitting: Physician Assistant

## 2020-12-15 DIAGNOSIS — Z1231 Encounter for screening mammogram for malignant neoplasm of breast: Secondary | ICD-10-CM

## 2020-12-18 ENCOUNTER — Other Ambulatory Visit: Payer: Self-pay | Admitting: Psychiatry

## 2020-12-18 DIAGNOSIS — F5105 Insomnia due to other mental disorder: Secondary | ICD-10-CM

## 2021-01-30 ENCOUNTER — Inpatient Hospital Stay: Admission: RE | Admit: 2021-01-30 | Payer: Medicare PPO | Source: Ambulatory Visit

## 2021-02-15 ENCOUNTER — Telehealth: Payer: Self-pay | Admitting: Psychiatry

## 2021-02-15 ENCOUNTER — Other Ambulatory Visit: Payer: Self-pay | Admitting: Psychiatry

## 2021-02-15 DIAGNOSIS — F988 Other specified behavioral and emotional disorders with onset usually occurring in childhood and adolescence: Secondary | ICD-10-CM

## 2021-02-15 DIAGNOSIS — F4001 Agoraphobia with panic disorder: Secondary | ICD-10-CM

## 2021-02-15 MED ORDER — AMPHETAMINE-DEXTROAMPHETAMINE 10 MG PO TABS
10.0000 mg | ORAL_TABLET | Freq: Three times a day (TID) | ORAL | 0 refills | Status: DC
Start: 2021-02-15 — End: 2021-05-10

## 2021-02-15 MED ORDER — CLONAZEPAM 0.5 MG PO TABS: 0.5000 mg | ORAL_TABLET | Freq: Two times a day (BID) | ORAL | 0 refills | Status: DC

## 2021-02-15 NOTE — Telephone Encounter (Signed)
Pt left a message asking for a refill on her adderall 10 mg and klonopin 0.5 mg to be sent to the [facility] on [address]. She has an appt 05/10/21

## 2021-02-15 NOTE — Telephone Encounter (Signed)
Please review

## 2021-02-16 ENCOUNTER — Other Ambulatory Visit: Payer: Self-pay | Admitting: Physician Assistant

## 2021-02-16 ENCOUNTER — Ambulatory Visit
Admission: RE | Admit: 2021-02-16 | Discharge: 2021-02-16 | Disposition: A | Discharge status: Home / Self Care | Payer: Medicare PPO | Source: Ambulatory Visit | Attending: Physician Assistant | Admitting: Physician Assistant

## 2021-02-16 ENCOUNTER — Other Ambulatory Visit: Payer: Self-pay

## 2021-02-16 DIAGNOSIS — R2232 Localized swelling, mass and lump, left upper limb: Secondary | ICD-10-CM

## 2021-02-16 DIAGNOSIS — N63 Unspecified lump in unspecified breast: Secondary | ICD-10-CM

## 2021-02-16 DIAGNOSIS — Z1231 Encounter for screening mammogram for malignant neoplasm of breast: Secondary | ICD-10-CM

## 2021-03-19 DIAGNOSIS — L814 Other melanin hyperpigmentation: Secondary | ICD-10-CM | POA: Diagnosis not present

## 2021-03-19 DIAGNOSIS — L304 Erythema intertrigo: Secondary | ICD-10-CM | POA: Diagnosis not present

## 2021-03-19 DIAGNOSIS — L281 Prurigo nodularis: Secondary | ICD-10-CM | POA: Diagnosis not present

## 2021-03-19 DIAGNOSIS — L821 Other seborrheic keratosis: Secondary | ICD-10-CM | POA: Diagnosis not present

## 2021-03-19 DIAGNOSIS — D0439 Carcinoma in situ of skin of other parts of face: Secondary | ICD-10-CM | POA: Diagnosis not present

## 2021-05-01 ENCOUNTER — Other Ambulatory Visit: Payer: Self-pay | Admitting: Psychiatry

## 2021-05-01 DIAGNOSIS — F4001 Agoraphobia with panic disorder: Secondary | ICD-10-CM

## 2021-05-01 DIAGNOSIS — F411 Generalized anxiety disorder: Secondary | ICD-10-CM

## 2021-05-10 ENCOUNTER — Encounter: Payer: Self-pay | Admitting: Psychiatry

## 2021-05-10 ENCOUNTER — Other Ambulatory Visit: Payer: Self-pay

## 2021-05-10 ENCOUNTER — Ambulatory Visit (INDEPENDENT_AMBULATORY_CARE_PROVIDER_SITE_OTHER): Payer: Medicare PPO | Admitting: Psychiatry

## 2021-05-10 VITALS — BP 109/58 | HR 99

## 2021-05-10 DIAGNOSIS — F4001 Agoraphobia with panic disorder: Secondary | ICD-10-CM

## 2021-05-10 DIAGNOSIS — F5105 Insomnia due to other mental disorder: Secondary | ICD-10-CM

## 2021-05-10 DIAGNOSIS — F411 Generalized anxiety disorder: Secondary | ICD-10-CM

## 2021-05-10 DIAGNOSIS — F3342 Major depressive disorder, recurrent, in full remission: Secondary | ICD-10-CM | POA: Diagnosis not present

## 2021-05-10 DIAGNOSIS — F988 Other specified behavioral and emotional disorders with onset usually occurring in childhood and adolescence: Secondary | ICD-10-CM | POA: Diagnosis not present

## 2021-05-10 MED ORDER — TRAZODONE HCL 150 MG PO TABS
150.0000 mg | ORAL_TABLET | Freq: Every day | ORAL | 1 refills | Status: DC
Start: 2021-05-10 — End: 2022-01-02

## 2021-05-10 MED ORDER — CLONAZEPAM 0.5 MG PO TABS
0.5000 mg | ORAL_TABLET | Freq: Two times a day (BID) | ORAL | 1 refills | Status: DC
Start: 2021-05-10 — End: 2022-01-02

## 2021-05-10 MED ORDER — SERTRALINE HCL 100 MG PO TABS: 200.0000 mg | ORAL_TABLET | Freq: Every day | ORAL | 1 refills | Status: DC

## 2021-05-10 MED ORDER — AMPHETAMINE-DEXTROAMPHETAMINE 10 MG PO TABS: 10.0000 mg | ORAL_TABLET | Freq: Three times a day (TID) | ORAL | 0 refills | Status: DC

## 2021-05-10 NOTE — Progress Notes (Signed)
Gloria Fields [id number] March 20, 1946 75 y.o.  Subjective:   Patient ID:  Gloria Fields is a 75 y.o. (DOB 1946-06-25) female.  Chief Complaint:  Chief Complaint  Patient presents with   Follow-up   Recurrent major depression in full remission ([place])   Anxiety   ADD    Anxiety Symptoms include nervous/anxious behavior. Patient reports no palpitations.    Medication Refill Associated symptoms include abdominal pain and arthralgias. Pertinent negatives include no vomiting or weakness.  Gloria Fields presents to the office today for follow-up of depression anxiety, ADD, sleep.  seen in September 2019.  Trazodone was increased to 150 mg nightly for sleep and depression.  She was also given hydroxyzine 25 mg 3 times daily for anxiety.  seen June 2020 without med changes.  On Adderall 10 mg twice daily, buspirone 30 mg twice daily, clonazepam 0.5 mg twice daily, gabapentin 100 mg twice daily, hydroxyzine 25 3 times daily as needed, sertraline 200, trazodone 150-300 nightly for sleep  Usually takes hydroxyzine HS for sleep.  But out of it awhile and doing OK usually.  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   Very upset with politics.   Chronic GI problems run in her family too.  I'm doing ok overall.  Enjoys staying home.  Retired Pharmacist, hospital.  Jumps from one thing to next without the Adderall. Thinks she benefits from the meds.   Patient reports stable mood and denies depressed or irritable moods.  Patient denies any recent difficulty with anxiety.  Better lately with less GI problems.  Chronic intermittent GI sx.  Anxiety flares can cause sweating.  Patient denies difficulty with sleep initiation or maintenance. Denies appetite disturbance.  Patient reports that energy and motivation have been good.  Patient denies any difficulty with concentration.  Patient denies any suicidal ideation. I  don't like change.  05/09/20 appt with following noted: Tremendous anxiety about leaving the house and needs clonazepam.  Uses prn. Average one daily or for sleep. Asks to increase Adderall to TID.  Wears off at 3 pm and can't do much.   Wants to clean out her house and be more productive.  Getting things done with Adderall in morning. House looks like hoarder house LT.   No kids, no husband.  Just ended 64 year rel with cruel man with PD.  He lost job house and car sending money to overseas woman.   Residual depression, anxiety, sleep problems with erratic schedule.  Procrastinates. Tolerates meds. Plan: OK increase Adderall to 10 TID to prolong productivity period which could also help mood.  11/08/2020 appointment with the following noted: Increased Adderall TID helped productivity.  Organizes closests but there's stuff all over the house and working on it.   Clonazepam variable.  Needs it to go out places. But some days don't take it. Still takes sertraline 200 and trazodone 150-300.   Sleep is OK.  Occ NM less often. 5 cats and a dog. Some depression but manageable.  Anxiety episodic. Tolerating meds.  Ongoing GI problems.  Takes antibiotic for it with benefit. Plan:  OK increase Adderall to 10 TID to prolong productivity period which could also help mood.  05/10/21 appt noted: Longer duration with Adderall 10 TID bu tstill procrastinates. Don't do much.  Can't stand the heat.  Some yard work Not crying or marked depression.  Not sad.  Has interests. Sleep is  good. Anxierty with losing things or crowds or rushed. Clonazepam to go out. Not daily.  Past Psychiatric Medication Trials: Patient reportedly been on Adderall for about 25 years.   Clonazepam 0.5, lorazepam buspirone 30 twice daily, sertraline 200,  Wellbutrin, citalopram, trazodone, gabapentin, hydroxyzine, Lunesta,  olanzapine, Abilify, Saphris,   Review of Systems:  Review of Systems  Cardiovascular:  Negative for  palpitations.  Gastrointestinal:  Positive for abdominal pain and diarrhea. Negative for vomiting.  Musculoskeletal:  Positive for arthralgias.  Neurological:  Negative for tremors and weakness.  Psychiatric/Behavioral:  The patient is nervous/anxious.  GI problems diet related in part  Medications: I have reviewed the patient's current medications.  Current Outpatient Medications  Medication Sig Dispense Refill   amphetamine-dextroamphetamine (ADDERALL) 10 MG tablet Take 1 tablet (10 mg total) by mouth in the morning, at noon, and at bedtime. 1 bid 270 tablet 0   antiseptic oral rinse (BIOTENE) LIQD 15 mLs by Mouth Rinse route as needed for dry mouth (uses when she brushes her teeth).     busPIRone (BUSPAR) 30 MG tablet Take 1 tablet (30 mg total) by mouth 2 (two) times daily. 180 tablet 2   clonazePAM (KLONOPIN) 0.5 MG tablet Take 1 tablet (0.5 mg total) by mouth 2 (two) times daily. 1-2 prn 180 tablet 0   clotrimazole-betamethasone (LOTRISONE) cream APPLY TO AFFECTED AREA TOPICALLY 3 TIMES A DAY FOR 3 MONTHS     cycloSPORINE (RESTASIS) 0.05 % ophthalmic emulsion 1 drop 2 (two) times daily.     dexlansoprazole (DEXILANT) 60 MG capsule Take 1 capsule (60 mg total) by mouth daily. 30 capsule 6   famotidine (PEPCID) 40 MG tablet Take 40 mg by mouth 2 (two) times daily.     gabapentin (NEURONTIN) 100 MG capsule TAKE 1 CAPSULE BY MOUTH TWICE A DAY 180 capsule 0   Hypromellose 0.3 % SOLN Apply 1 drop to eye 2 (two) times daily.     ondansetron (ZOFRAN) 4 MG tablet Take 1 tablet (4 mg total) by mouth every 8 (eight) hours as needed for nausea or vomiting. 20 tablet 0   PREVIDENT 5000 DRY MOUTH 1.1 % GEL dental gel      rifaximin (XIFAXAN) 550 MG TABS tablet Take 1 tablet (550 mg total) by mouth 3 (three) times daily. 42 tablet 0   sertraline (ZOLOFT) 100 MG tablet Take 2 tablets (200 mg total) by mouth daily. 180 tablet 1   traZODone (DESYREL) 150 MG tablet TAKE 1-2 TABLETS (150-300 MG TOTAL) BY  MOUTH DAILY. 180 tablet 1   valsartan-hydrochlorothiazide (DIOVAN-HCT) 160-25 MG tablet Take 1 tablet by mouth every morning.     Omega-3 Fatty Acids (FISH OIL) 1000 MG CAPS Take 1,000 mg by mouth 2 (two) times daily. (Patient not taking: Reported on 05/10/2021)     No current facility-administered medications for this visit.    Medication Side Effects: None  Allergies:  Allergies  Allergen Reactions   Morphine Other (See Comments)    REACTION: paranoid    Past Medical History:  Diagnosis Date   ADD (attention deficit disorder)    Anemia, normocytic normochromic 12/25/2012   Mild, chronic, Hb 11-12 grams at least since 11/2006; screening studies & endoscopies unremarkable   Anxiety    Back pain    Breast cancer ([place]) 2007   Chronic kidney disease    hx of stage 3 kidney disease    Depression    Diverticulosis of colon (without mention of hemorrhage)    Esophageal reflux  H/O dehydration    Headache    PMH   Heart murmur    History of blood transfusion    History of radiation therapy    Hypercholesterolemia    Hypertension    Irritable bowel syndrome    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 ([place]) 12/24/2013   Osteopenia    Ovarian cystic mass 07/07/2015   Personal history of radiation therapy 2008   PONV (postoperative nausea and vomiting)    also had severe itching    Pyloric stenosis    Repeated falls    Shortness of breath dyspnea    walking distances or climbing stairs    Family History  Problem Relation Age of Onset   Other Father        colon rupture   Pancreatic cancer Cousin    Diabetes Maternal Aunt    Breast cancer Maternal Aunt    Prostate cancer Maternal Grandfather    Breast cancer Maternal Aunt        X 3   Breast cancer Maternal Aunt     Social History   Socioeconomic History   Marital status: Divorced    Spouse name: Not on file   Number of children: 0   Years of education: Not on file   Highest education level:  Not on file  Occupational History   Occupation: Retired Product manager: RETIRED  Tobacco Use   Smoking status: Never   Smokeless tobacco: Never  Vaping Use   Vaping Use: Never used  Substance and Sexual Activity   Alcohol use: Yes    Alcohol/week: 0.0 standard drinks    Comment: rarely   Drug use: No   Sexual activity: Not on file  Other Topics Concern   Not on file  Social History Narrative   She is divorced. No children. She is a retired Statistician.    Lives alone.   One caffeinated beverage daily.      Social Determinants of Health   Financial Resource Strain: Not on file  Food Insecurity: Not on file  Transportation Needs: Not on file  Physical Activity: Not on file  Stress: Not on file  Social Connections: Not on file  Intimate Partner Violence: Not on file    Past Medical History, Surgical history, Social history, and Family history were reviewed and updated as appropriate.   Lives now with man shes dated for 20 years who moved in due to PD  Please see review of systems for further details on the patient's review from today.   Objective:   Physical Exam:  BP (!) 109/58   Pulse 99   Physical Exam Constitutional:      General: She is not in acute distress.    Appearance: She is well-developed. She is obese.  Musculoskeletal:        General: No deformity.  Neurological:     Mental Status: She is alert and oriented to person, place, and time.     Cranial Nerves: No dysarthria.     Coordination: Coordination normal.  Psychiatric:        Attention and Perception: She is inattentive.        Mood and Affect: Mood is anxious and depressed. Affect is not labile, blunt, angry, tearful or inappropriate.        Speech: Speech normal. Speech is not rapid and pressured or slurred.        Behavior: Behavior normal.  Thought Content: Thought content normal. Thought content does not include homicidal or suicidal ideation. Thought content does not  include homicidal or suicidal plan.        Cognition and Memory: Cognition normal.        Judgment: Judgment normal.     Comments: Insight is fair.    sitational depression generally mild.    Lab Review:     Component Value Date/Time   NA 142 01/15/2017 0901   K 4.2 01/15/2017 0901   CL 105 10/13/2015 0440   CO2 25 01/15/2017 0901   GLUCOSE 100 01/15/2017 0901   BUN 22.3 01/15/2017 0901   CREATININE 1.3 (H) 01/15/2017 0901   CALCIUM 9.3 01/15/2017 0901   PROT 6.6 01/15/2017 0901   ALBUMIN 3.6 01/15/2017 0901   AST 14 01/15/2017 0901   ALT 10 01/15/2017 0901   ALKPHOS 65 01/15/2017 0901   BILITOT 0.27 01/15/2017 0901   GFRNONAA >60 10/13/2015 0440   GFRAA >60 10/13/2015 0440       Component Value Date/Time   WBC 6.0 01/15/2017 0901   WBC 9.6 10/13/2015 0440   RBC 3.55 (L) 01/15/2017 0901   RBC 3.13 (L) 10/13/2015 0440   HGB 10.1 (L) 01/15/2017 0901   HCT 31.5 (L) 01/15/2017 0901   PLT 177 01/15/2017 0901   MCV 88.7 01/15/2017 0901   MCH 28.5 01/15/2017 0901   MCH 30.4 10/13/2015 0440   MCHC 32.1 01/15/2017 0901   MCHC 32.9 10/13/2015 0440   RDW 14.1 01/15/2017 0901   LYMPHSABS 2.4 01/15/2017 0901   MONOABS 0.6 01/15/2017 0901   EOSABS 0.5 01/15/2017 0901   BASOSABS 0.0 01/15/2017 0901    No results found for: POCLITH, LITHIUM   No results found for: PHENYTOIN, PHENOBARB, VALPROATE, CBMZ   .res Assessment: Plan:    Gloria Fields was seen today for follow-up, recurrent major depression in full remission ([facility]), anxiety and add.  Diagnoses and all orders for this visit:  Recurrent major depression in full remission ([place])  Generalized anxiety disorder  Panic disorder with agoraphobia  Attention deficit disorder, unspecified hyperactivity presence  Insomnia due to mental condition   We discussed the short-term risks associated with benzodiazepines including sedation and increased fall risk among others.  Discussed long-term side effect risk including  dependence, potential withdrawal symptoms, and the potential eventual dose-related risk of dementia.  Not ideal to take stimulants and clonazepam but needs BZ to leave the house.  Fearful.  Adderall helps function. OK  Adderall 10 TID to prolong productivity period which could also help mood. 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  Nearly died there and had delirium.  Resolved mostly except some anxierty. Some avoidance.  Discussed potential benefits, risks, and side effects of stimulants with patient to include increased heart rate, palpitations, insomnia, increased anxiety, increased irritability, or decreased appetite.  Instructed patient to contact office if experiencing any significant tolerability issues.  Supportive therapy dealing with broken relationship and loneliness.  Disc hoarding and pushing herself through procrastination and hoarding.  Trazodone helps sleep. Continue sertraline 200 mg daily.  Benefit and tolerating meds.  FU 6 mos  Lynder Parents, MD, DFAPA   Please see After Visit Summary for patient specific instructions.  No future appointments.  No orders of the defined types were placed in this encounter.   -------------------------------

## 2021-07-30 ENCOUNTER — Other Ambulatory Visit: Payer: Self-pay | Admitting: Physician Assistant

## 2021-07-30 DIAGNOSIS — F411 Generalized anxiety disorder: Secondary | ICD-10-CM

## 2021-07-30 DIAGNOSIS — F4001 Agoraphobia with panic disorder: Secondary | ICD-10-CM

## 2021-08-03 ENCOUNTER — Other Ambulatory Visit: Payer: Self-pay | Admitting: Psychiatry

## 2021-08-03 DIAGNOSIS — F411 Generalized anxiety disorder: Secondary | ICD-10-CM

## 2021-11-07 ENCOUNTER — Other Ambulatory Visit: Payer: Self-pay | Admitting: Psychiatry

## 2021-11-07 DIAGNOSIS — F411 Generalized anxiety disorder: Secondary | ICD-10-CM

## 2021-11-07 NOTE — Telephone Encounter (Signed)
Patient has an appt with Dr. Clovis Pu next week. Called patient to see if she had enough tablets left until then. LVM to RC.

## 2021-11-12 ENCOUNTER — Ambulatory Visit: Payer: Medicare PPO | Admitting: Psychiatry

## 2022-01-02 ENCOUNTER — Other Ambulatory Visit: Payer: Self-pay

## 2022-01-02 ENCOUNTER — Telehealth: Payer: Self-pay | Admitting: Psychiatry

## 2022-01-02 ENCOUNTER — Other Ambulatory Visit: Payer: Self-pay | Admitting: Psychiatry

## 2022-01-02 DIAGNOSIS — F4001 Agoraphobia with panic disorder: Secondary | ICD-10-CM

## 2022-01-02 DIAGNOSIS — F5105 Insomnia due to other mental disorder: Secondary | ICD-10-CM

## 2022-01-02 DIAGNOSIS — F411 Generalized anxiety disorder: Secondary | ICD-10-CM

## 2022-01-02 DIAGNOSIS — F3342 Major depressive disorder, recurrent, in full remission: Secondary | ICD-10-CM

## 2022-01-02 MED ORDER — SERTRALINE HCL 100 MG PO TABS: 200.0000 mg | ORAL_TABLET | Freq: Every day | ORAL | 0 refills | Status: DC

## 2022-01-02 MED ORDER — BUSPIRONE HCL 30 MG PO TABS: 30.0000 mg | ORAL_TABLET | Freq: Two times a day (BID) | ORAL | 0 refills | Status: DC

## 2022-01-02 MED ORDER — TRAZODONE HCL 150 MG PO TABS: 150.0000 mg | ORAL_TABLET | Freq: Every day | ORAL | 0 refills | Status: DC

## 2022-01-02 MED ORDER — AMPHETAMINE-DEXTROAMPHETAMINE 30 MG PO TABS: ORAL_TABLET | ORAL | 0 refills | Status: DC

## 2022-01-02 MED ORDER — CLONAZEPAM 0.5 MG PO TABS: 0.5000 mg | ORAL_TABLET | Freq: Two times a day (BID) | ORAL | 0 refills | Status: DC

## 2022-01-02 NOTE — Telephone Encounter (Signed)
RX sent 30 day.  NS last appt

## 2022-01-02 NOTE — Telephone Encounter (Signed)
error 

## 2022-01-02 NOTE — Telephone Encounter (Signed)
Pharmacy does not have adderall in stock.They do have 30 mg if you want her to take that instead,1 daily

## 2022-01-02 NOTE — Telephone Encounter (Signed)
pended

## 2022-01-02 NOTE — Telephone Encounter (Signed)
Patient called in for refill on several medications. Trazodone 150mg , Setraline 100mg . Clonazepam 0.5mg  and Adderall 10mg . Ph: [phone] Appt 3/6. Pharmacy [facility]

## 2022-01-09 DIAGNOSIS — H5203 Hypermetropia, bilateral: Secondary | ICD-10-CM | POA: Diagnosis not present

## 2022-01-09 DIAGNOSIS — H524 Presbyopia: Secondary | ICD-10-CM | POA: Diagnosis not present

## 2022-01-09 DIAGNOSIS — H52223 Regular astigmatism, bilateral: Secondary | ICD-10-CM | POA: Diagnosis not present

## 2022-01-09 DIAGNOSIS — H43813 Vitreous degeneration, bilateral: Secondary | ICD-10-CM | POA: Diagnosis not present

## 2022-01-09 DIAGNOSIS — H04123 Dry eye syndrome of bilateral lacrimal glands: Secondary | ICD-10-CM | POA: Diagnosis not present

## 2022-01-09 DIAGNOSIS — H25813 Combined forms of age-related cataract, bilateral: Secondary | ICD-10-CM | POA: Diagnosis not present

## 2022-01-28 ENCOUNTER — Encounter: Payer: Self-pay | Admitting: Psychiatry

## 2022-01-28 ENCOUNTER — Ambulatory Visit: Payer: Medicare PPO | Admitting: Psychiatry

## 2022-01-28 ENCOUNTER — Other Ambulatory Visit: Payer: Self-pay

## 2022-01-28 DIAGNOSIS — F902 Attention-deficit hyperactivity disorder, combined type: Secondary | ICD-10-CM | POA: Diagnosis not present

## 2022-01-28 DIAGNOSIS — F423 Hoarding disorder: Secondary | ICD-10-CM | POA: Diagnosis not present

## 2022-01-28 DIAGNOSIS — F3342 Major depressive disorder, recurrent, in full remission: Secondary | ICD-10-CM | POA: Diagnosis not present

## 2022-01-28 DIAGNOSIS — F4001 Agoraphobia with panic disorder: Secondary | ICD-10-CM

## 2022-01-28 DIAGNOSIS — F5105 Insomnia due to other mental disorder: Secondary | ICD-10-CM | POA: Diagnosis not present

## 2022-01-28 DIAGNOSIS — F988 Other specified behavioral and emotional disorders with onset usually occurring in childhood and adolescence: Secondary | ICD-10-CM

## 2022-01-28 DIAGNOSIS — F411 Generalized anxiety disorder: Secondary | ICD-10-CM | POA: Diagnosis not present

## 2022-01-28 MED ORDER — TRAZODONE HCL 150 MG PO TABS: 150.0000 mg | ORAL_TABLET | Freq: Every day | ORAL | 1 refills | Status: DC

## 2022-01-28 MED ORDER — SERTRALINE HCL 100 MG PO TABS: 200.0000 mg | ORAL_TABLET | Freq: Every day | ORAL | 1 refills | Status: DC

## 2022-01-28 MED ORDER — BUSPIRONE HCL 30 MG PO TABS: 30.0000 mg | ORAL_TABLET | Freq: Two times a day (BID) | ORAL | 1 refills | Status: DC

## 2022-01-28 MED ORDER — AMPHETAMINE-DEXTROAMPHETAMINE 10 MG PO TABS: 10.0000 mg | ORAL_TABLET | Freq: Three times a day (TID) | ORAL | 0 refills | Status: DC

## 2022-01-28 NOTE — Progress Notes (Signed)
Gloria Fields [id number] 1946/10/12 76 y.o.  Subjective:   Patient ID:  Gloria Fields is a 76 y.o. (DOB 1946-08-16) female.  Chief Complaint:  Chief Complaint  Patient presents with   Follow-up   Anxiety   ADD   Depression    Anxiety Symptoms include nervous/anxious behavior. Patient reports no palpitations.    Medication Refill Associated symptoms include abdominal pain and arthralgias. Pertinent negatives include no weakness.  Gloria Fields presents to the office today for follow-up of depression anxiety, ADD, sleep.  seen in September 2019.  Trazodone was increased to 150 mg nightly for sleep and depression.  She was also given hydroxyzine 25 mg 3 times daily for anxiety.  seen June 2020 without med changes.  On Adderall 10 mg twice daily, buspirone 30 mg twice daily, clonazepam 0.5 mg twice daily, gabapentin 100 mg twice daily, hydroxyzine 25 3 times daily as needed, sertraline 200, trazodone 150-300 nightly for sleep  05/09/20 appt with following noted: Tremendous anxiety about leaving the house and needs clonazepam.  Uses prn. Average one daily or for sleep. Asks to increase Adderall to TID.  Wears off at 3 pm and can't do much.   Wants to clean out her house and be more productive.  Getting things done with Adderall in morning. House looks like hoarder house LT.   No kids, no husband.  Just ended 14 year rel with cruel man with PD.  He lost job house and car sending money to overseas woman.   Residual depression, anxiety, sleep problems with erratic schedule.  Procrastinates. Tolerates meds. Plan: OK increase Adderall to 10 TID to prolong productivity period which could also help mood.  11/08/2020 appointment with the following noted: Increased Adderall TID helped productivity.  Organizes closests but there's stuff all over the house and working on it.   Clonazepam variable.  Needs it to go out places. But some days don't take it. Still takes  sertraline 200 and trazodone 150-300.   Sleep is OK.  Occ NM less often. 5 cats and a dog. Some depression but manageable.  Anxiety episodic. Tolerating meds.  Ongoing GI problems.  Takes antibiotic for it with benefit. Plan:  OK increase Adderall to 10 TID to prolong productivity period which could also help mood.  05/10/21 appt noted: Longer duration with Adderall 10 TID bu tstill procrastinates. Don't do much.  Can't stand the heat.  Some yard work Not crying or marked depression.  Not sad.  Has interests. Sleep is good. Anxierty with losing things or crowds or rushed. Clonazepam to go out. Not daily. Plan: No med changes, continue sertraline 200 mg and Adderall 10 mg 3 times daily, trazodone 300 mg HS, Buspirone 30 BID, clonazepam 0.5 mg prn  01/28/2022 appointment with the following noted: Average clonazepam up to 2 daily and some days none. Allergy problems. Some ups and downs since here but overall OK. Consistent with meds without problems. No SE 1-2 panic since here. Clonazepam helps. Sleep good with trazodone 10 hours.  Like to have my sleep. Lonely around holidays.  Past Psychiatric Medication Trials: Patient reportedly been on Adderall for about 25 years.   Clonazepam 0.5, lorazepam buspirone 30 twice daily, sertraline 200,  Wellbutrin, citalopram, trazodone, gabapentin, hydroxyzine, Lunesta,  olanzapine, Abilify, Saphris,   Review of Systems:  Review of Systems  Cardiovascular:  Negative for palpitations.  Gastrointestinal:  Positive for abdominal pain and diarrhea.  Musculoskeletal:  Positive for arthralgias.  Neurological:  Negative for tremors and  weakness.  Psychiatric/Behavioral:  The patient is nervous/anxious.  GI problems diet related in part  Medications: I have reviewed the patient's current medications.  Current Outpatient Medications  Medication Sig Dispense Refill   amphetamine-dextroamphetamine (ADDERALL) 30 MG tablet 1/4 tablet 3 times daily 23  tablet 0   antiseptic oral rinse (BIOTENE) LIQD 15 mLs by Mouth Rinse route as needed for dry mouth (uses when she brushes her teeth).     clonazePAM (KLONOPIN) 0.5 MG tablet Take 1 tablet (0.5 mg total) by mouth 2 (two) times daily. 60 tablet 0   clotrimazole-betamethasone (LOTRISONE) cream APPLY TO AFFECTED AREA TOPICALLY 3 TIMES A DAY FOR 3 MONTHS     cycloSPORINE (RESTASIS) 0.05 % ophthalmic emulsion 1 drop 2 (two) times daily.     dexlansoprazole (DEXILANT) 60 MG capsule Take 1 capsule (60 mg total) by mouth daily. 30 capsule 6   famotidine (PEPCID) 40 MG tablet Take 40 mg by mouth 2 (two) times daily.     gabapentin (NEURONTIN) 100 MG capsule TAKE 1 CAPSULE BY MOUTH TWICE A DAY 180 capsule 0   Hypromellose 0.3 % SOLN Apply 1 drop to eye 2 (two) times daily.     Omega-3 Fatty Acids (FISH OIL) 1000 MG CAPS Take 1,000 mg by mouth 2 (two) times daily.     ondansetron (ZOFRAN) 4 MG tablet Take 1 tablet (4 mg total) by mouth every 8 (eight) hours as needed for nausea or vomiting. 20 tablet 0   PREVIDENT 5000 DRY MOUTH 1.1 % GEL dental gel      rifaximin (XIFAXAN) 550 MG TABS tablet Take 1 tablet (550 mg total) by mouth 3 (three) times daily. 42 tablet 0   valsartan-hydrochlorothiazide (DIOVAN-HCT) 160-25 MG tablet Take 1 tablet by mouth every morning.     amphetamine-dextroamphetamine (ADDERALL) 10 MG tablet Take 1 tablet (10 mg total) by mouth in the morning, at noon, and at bedtime. 270 tablet 0   busPIRone (BUSPAR) 30 MG tablet Take 1 tablet (30 mg total) by mouth 2 (two) times daily. 180 tablet 1   sertraline (ZOLOFT) 100 MG tablet Take 2 tablets (200 mg total) by mouth daily. 180 tablet 1   traZODone (DESYREL) 150 MG tablet Take 1-2 tablets (150-300 mg total) by mouth daily. 180 tablet 1   No current facility-administered medications for this visit.    Medication Side Effects: None  Allergies:  Allergies  Allergen Reactions   Morphine Other (See Comments)    REACTION: paranoid     Past Medical History:  Diagnosis Date   ADD (attention deficit disorder)    Anemia, normocytic normochromic 12/25/2012   Mild, chronic, Hb 11-12 grams at least since 11/2006; screening studies & endoscopies unremarkable   Anxiety    Back pain    Breast cancer ([place]) 2007   Chronic kidney disease    hx of stage 3 kidney disease    Depression    Diverticulosis of colon (without mention of hemorrhage)    Esophageal reflux    H/O dehydration    Headache    PMH   Heart murmur    History of blood transfusion    History of radiation therapy    Hypercholesterolemia    Hypertension    Irritable bowel syndrome    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 ([place]) 12/24/2013   Osteopenia    Ovarian cystic mass 07/07/2015   Personal history of radiation therapy 2008   PONV (postoperative nausea and vomiting)    also  had severe itching    Pyloric stenosis    Repeated falls    Shortness of breath dyspnea    walking distances or climbing stairs    Family History  Problem Relation Age of Onset   Other Father        colon rupture   Pancreatic cancer Cousin    Diabetes Maternal Aunt    Breast cancer Maternal Aunt    Prostate cancer Maternal Grandfather    Breast cancer Maternal Aunt        X 3   Breast cancer Maternal Aunt     Social History   Socioeconomic History   Marital status: Divorced    Spouse name: Not on file   Number of children: 0   Years of education: Not on file   Highest education level: Not on file  Occupational History   Occupation: Retired Product manager: RETIRED  Tobacco Use   Smoking status: Never   Smokeless tobacco: Never  Vaping Use   Vaping Use: Never used  Substance and Sexual Activity   Alcohol use: Yes    Alcohol/week: 0.0 standard drinks    Comment: rarely   Drug use: No   Sexual activity: Not on file  Other Topics Concern   Not on file  Social History Narrative   She is divorced. No children. She is a retired  Statistician.    Lives alone.   One caffeinated beverage daily.      Social Determinants of Health   Financial Resource Strain: Not on file  Food Insecurity: Not on file  Transportation Needs: Not on file  Physical Activity: Not on file  Stress: Not on file  Social Connections: Not on file  Intimate Partner Violence: Not on file    Past Medical History, Surgical history, Social history, and Family history were reviewed and updated as appropriate.   Lives now with man shes dated for 20 years who moved in due to PD  Please see review of systems for further details on the patient's review from today.   Objective:   Physical Exam:  There were no vitals taken for this visit.  Physical Exam Constitutional:      General: She is not in acute distress.    Appearance: She is well-developed. She is obese.  Musculoskeletal:        General: No deformity.  Neurological:     Mental Status: She is alert and oriented to person, place, and time.     Cranial Nerves: No dysarthria.     Coordination: Coordination normal.  Psychiatric:        Attention and Perception: She is inattentive.        Mood and Affect: Mood is anxious and depressed. Affect is not labile, blunt, angry, tearful or inappropriate.        Speech: Speech normal. Speech is not rapid and pressured or slurred.        Behavior: Behavior normal.        Thought Content: Thought content normal. Thought content is not delusional. Thought content does not include homicidal or suicidal ideation. Thought content does not include suicidal plan.        Cognition and Memory: Cognition normal.        Judgment: Judgment normal.     Comments: Insight is fair.    sitational depression generally mild. Residual chronic anxiety    Lab Review:     Component Value Date/Time   NA 142 01/15/2017 0901  K 4.2 01/15/2017 0901   CL 105 10/13/2015 0440   CO2 25 01/15/2017 0901   GLUCOSE 100 01/15/2017 0901   BUN 22.3 01/15/2017 0901    CREATININE 1.3 (H) 01/15/2017 0901   CALCIUM 9.3 01/15/2017 0901   PROT 6.6 01/15/2017 0901   ALBUMIN 3.6 01/15/2017 0901   AST 14 01/15/2017 0901   ALT 10 01/15/2017 0901   ALKPHOS 65 01/15/2017 0901   BILITOT 0.27 01/15/2017 0901   GFRNONAA >60 10/13/2015 0440   GFRAA >60 10/13/2015 0440       Component Value Date/Time   WBC 6.0 01/15/2017 0901   WBC 9.6 10/13/2015 0440   RBC 3.55 (L) 01/15/2017 0901   RBC 3.13 (L) 10/13/2015 0440   HGB 10.1 (L) 01/15/2017 0901   HCT 31.5 (L) 01/15/2017 0901   PLT 177 01/15/2017 0901   MCV 88.7 01/15/2017 0901   MCH 28.5 01/15/2017 0901   MCH 30.4 10/13/2015 0440   MCHC 32.1 01/15/2017 0901   MCHC 32.9 10/13/2015 0440   RDW 14.1 01/15/2017 0901   LYMPHSABS 2.4 01/15/2017 0901   MONOABS 0.6 01/15/2017 0901   EOSABS 0.5 01/15/2017 0901   BASOSABS 0.0 01/15/2017 0901    No results found for: POCLITH, LITHIUM   No results found for: PHENYTOIN, PHENOBARB, VALPROATE, CBMZ   .res Assessment: Plan:    Gloria Fields was seen today for follow-up, anxiety, add and depression.  Diagnoses and all orders for this visit:  Panic disorder with agoraphobia -     sertraline (ZOLOFT) 100 MG tablet; Take 2 tablets (200 mg total) by mouth daily.  Hoarding disorder  Generalized anxiety disorder -     busPIRone (BUSPAR) 30 MG tablet; Take 1 tablet (30 mg total) by mouth 2 (two) times daily. -     sertraline (ZOLOFT) 100 MG tablet; Take 2 tablets (200 mg total) by mouth daily.  Insomnia due to mental condition -     traZODone (DESYREL) 150 MG tablet; Take 1-2 tablets (150-300 mg total) by mouth daily.  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  Attention deficit disorder, unspecified hyperactivity presence -     amphetamine-dextroamphetamine (ADDERALL) 10 MG tablet; Take 1 tablet (10 mg  total) by mouth in the morning, at noon, and at bedtime.    Greater than 50% of 30 min face to face time with patient was spent on counseling and coordination of care. We discussed diagnoses to which should be added hoarding.  Struggling with this ongoing.  We discussed the short-term risks associated with benzodiazepines including sedation and increased fall risk among others.  Discussed long-term side effect risk including dependence, potential withdrawal symptoms, and the potential eventual dose-related risk of dementia.  But recent studies from 2020 dispute this association between benzodiazepines and dementia risk. Newer studies in 2020 do not support an association with dementia.  Not ideal to take stimulants and clonazepam but needs BZ to leave the house.  Fearful.  Adderall helps function. OK  Adderall 10 TID to prolong productivity period which could also help mood. 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  Nearly died there and had delirium.  Resolved mostly except some anxierty. Some avoidance.  Discussed potential benefits, risks, and side effects of stimulants with patient to include increased heart rate, palpitations, insomnia, increased anxiety, increased irritability, or decreased appetite.  Instructed patient  to contact office if experiencing any significant tolerability issues.  Supportive therapy dealing with broken relationship and loneliness.  Disc hoarding and pushing herself through procrastination and hoarding.  She is making some progress since here. Encourage activity.  Trazodone helps sleep. 300 HS Continue sertraline 200 mg daily. Buspirone 30 BID  Adderall 10 TID  Continue prn clonazepam.  Benefit and tolerating meds.  FU 6 mos  Lynder Parents, MD, DFAPA   Please see After Visit Summary for patient specific instructions.  No future appointments.  No orders of the defined types were placed in this encounter.    -------------------------------

## 2022-03-11 DIAGNOSIS — H43813 Vitreous degeneration, bilateral: Secondary | ICD-10-CM | POA: Diagnosis not present

## 2022-03-11 DIAGNOSIS — H25813 Combined forms of age-related cataract, bilateral: Secondary | ICD-10-CM | POA: Diagnosis not present

## 2022-03-11 DIAGNOSIS — H04123 Dry eye syndrome of bilateral lacrimal glands: Secondary | ICD-10-CM | POA: Diagnosis not present

## 2022-03-21 DIAGNOSIS — D1801 Hemangioma of skin and subcutaneous tissue: Secondary | ICD-10-CM | POA: Diagnosis not present

## 2022-03-21 DIAGNOSIS — L820 Inflamed seborrheic keratosis: Secondary | ICD-10-CM | POA: Diagnosis not present

## 2022-03-21 DIAGNOSIS — L720 Epidermal cyst: Secondary | ICD-10-CM | POA: Diagnosis not present

## 2022-03-21 DIAGNOSIS — L814 Other melanin hyperpigmentation: Secondary | ICD-10-CM | POA: Diagnosis not present

## 2022-03-21 DIAGNOSIS — L821 Other seborrheic keratosis: Secondary | ICD-10-CM | POA: Diagnosis not present

## 2022-03-21 DIAGNOSIS — Z85828 Personal history of other malignant neoplasm of skin: Secondary | ICD-10-CM | POA: Diagnosis not present

## 2022-04-01 DIAGNOSIS — H25811 Combined forms of age-related cataract, right eye: Secondary | ICD-10-CM | POA: Diagnosis not present

## 2022-04-01 DIAGNOSIS — H25813 Combined forms of age-related cataract, bilateral: Secondary | ICD-10-CM | POA: Diagnosis not present

## 2022-04-09 ENCOUNTER — Other Ambulatory Visit: Payer: Self-pay | Admitting: Psychiatry

## 2022-04-09 DIAGNOSIS — F4001 Agoraphobia with panic disorder: Secondary | ICD-10-CM

## 2022-04-30 DIAGNOSIS — H269 Unspecified cataract: Secondary | ICD-10-CM | POA: Diagnosis not present

## 2022-04-30 DIAGNOSIS — H25811 Combined forms of age-related cataract, right eye: Secondary | ICD-10-CM | POA: Diagnosis not present

## 2022-05-06 DIAGNOSIS — H25812 Combined forms of age-related cataract, left eye: Secondary | ICD-10-CM | POA: Diagnosis not present

## 2022-05-21 DIAGNOSIS — H269 Unspecified cataract: Secondary | ICD-10-CM | POA: Diagnosis not present

## 2022-05-21 DIAGNOSIS — H25812 Combined forms of age-related cataract, left eye: Secondary | ICD-10-CM | POA: Diagnosis not present

## 2022-05-23 ENCOUNTER — Telehealth: Payer: Self-pay | Admitting: Psychiatry

## 2022-05-23 NOTE — Telephone Encounter (Signed)
Next visit is 07/31/22. Gloria Fields called requesting a refill on her Amphetamine salts and Clonazepam. Call to:  [facility], [address] [place], [place], [place] [address]. Phone number is [phone]. Per Mason they are both in stock at this location.

## 2022-05-24 ENCOUNTER — Other Ambulatory Visit: Payer: Self-pay

## 2022-05-24 DIAGNOSIS — F988 Other specified behavioral and emotional disorders with onset usually occurring in childhood and adolescence: Secondary | ICD-10-CM

## 2022-05-24 DIAGNOSIS — F4001 Agoraphobia with panic disorder: Secondary | ICD-10-CM

## 2022-05-24 MED ORDER — AMPHETAMINE-DEXTROAMPHETAMINE 10 MG PO TABS: 10.0000 mg | ORAL_TABLET | Freq: Three times a day (TID) | ORAL | 0 refills | Status: DC

## 2022-05-24 MED ORDER — CLONAZEPAM 0.5 MG PO TABS: 0.5000 mg | ORAL_TABLET | Freq: Three times a day (TID) | ORAL | 4 refills | Status: DC

## 2022-05-24 NOTE — Telephone Encounter (Signed)
Pended.

## 2022-07-31 ENCOUNTER — Encounter: Payer: Self-pay | Admitting: Psychiatry

## 2022-07-31 ENCOUNTER — Ambulatory Visit (INDEPENDENT_AMBULATORY_CARE_PROVIDER_SITE_OTHER): Payer: Medicare PPO | Admitting: Psychiatry

## 2022-07-31 VITALS — BP 105/63 | HR 102

## 2022-07-31 DIAGNOSIS — F902 Attention-deficit hyperactivity disorder, combined type: Secondary | ICD-10-CM | POA: Diagnosis not present

## 2022-07-31 DIAGNOSIS — F5105 Insomnia due to other mental disorder: Secondary | ICD-10-CM

## 2022-07-31 DIAGNOSIS — F988 Other specified behavioral and emotional disorders with onset usually occurring in childhood and adolescence: Secondary | ICD-10-CM | POA: Diagnosis not present

## 2022-07-31 DIAGNOSIS — F423 Hoarding disorder: Secondary | ICD-10-CM | POA: Diagnosis not present

## 2022-07-31 DIAGNOSIS — F4001 Agoraphobia with panic disorder: Secondary | ICD-10-CM | POA: Diagnosis not present

## 2022-07-31 DIAGNOSIS — F411 Generalized anxiety disorder: Secondary | ICD-10-CM | POA: Diagnosis not present

## 2022-07-31 DIAGNOSIS — F3342 Major depressive disorder, recurrent, in full remission: Secondary | ICD-10-CM

## 2022-07-31 MED ORDER — BUSPIRONE HCL 30 MG PO TABS: 30.0000 mg | ORAL_TABLET | Freq: Two times a day (BID) | ORAL | 1 refills | Status: DC

## 2022-07-31 MED ORDER — SERTRALINE HCL 100 MG PO TABS: 200.0000 mg | ORAL_TABLET | Freq: Every day | ORAL | 1 refills | Status: DC

## 2022-07-31 MED ORDER — AMPHETAMINE-DEXTROAMPHETAMINE 10 MG PO TABS: 10.0000 mg | ORAL_TABLET | Freq: Three times a day (TID) | ORAL | 0 refills | Status: DC

## 2022-07-31 MED ORDER — TRAZODONE HCL 150 MG PO TABS: 150.0000 mg | ORAL_TABLET | Freq: Every day | ORAL | 1 refills | Status: DC

## 2022-07-31 NOTE — Progress Notes (Signed)
[name] [id number] 13-Jun-1946 76 y.o.  Subjective:   Patient ID:  Gloria Fields is a 76 y.o. (DOB October 15, 1946) female.  Chief Complaint:  Chief Complaint  Patient presents with   Follow-up   Depression   Anxiety   ADHD    Anxiety Symptoms include nervous/anxious behavior. Patient reports no palpitations.    Medication Refill Associated symptoms include arthralgias. Pertinent negatives include no abdominal pain or weakness.  Depression        Gloria Fields presents to the office today for follow-up of depression anxiety, ADD, sleep.  seen in September 2019.  Trazodone was increased to 150 mg nightly for sleep and depression.  She was also given hydroxyzine 25 mg 3 times daily for anxiety.  seen June 2020 without med changes.  On Adderall 10 mg twice daily, buspirone 30 mg twice daily, clonazepam 0.5 mg twice daily, gabapentin 100 mg twice daily, hydroxyzine 25 3 times daily as needed, sertraline 200, trazodone 150-300 nightly for sleep  05/09/20 appt with following noted: Tremendous anxiety about leaving the house and needs clonazepam.  Uses prn. Average one daily or for sleep. Asks to increase Adderall to TID.  Wears off at 3 pm and can't do much.   Wants to clean out her house and be more productive.  Getting things done with Adderall in morning. House looks like hoarder house LT.   No kids, no husband.  Just ended 4 year rel with cruel man with PD.  He lost job house and car sending money to [place] woman.   Residual depression, anxiety, sleep problems with erratic schedule.  Procrastinates. Tolerates meds. Plan: OK increase Adderall to 10 TID to prolong productivity period which could also help mood.  11/08/2020 appointment with the following noted: Increased Adderall TID helped productivity.  Organizes closests but there's stuff all over the house and working on it.   Clonazepam variable.  Needs it to go out places. But some days don't take  it. Still takes sertraline 200 and trazodone 150-300.   Sleep is OK.  Occ NM less often. 5 cats and a dog. Some depression but manageable.  Anxiety episodic. Tolerating meds.  Ongoing GI problems.  Takes antibiotic for it with benefit. Plan:  OK increase Adderall to 10 TID to prolong productivity period which could also help mood.  05/10/21 appt noted: Longer duration with Adderall 10 TID bu tstill procrastinates. Don't do much.  Can't stand the heat.  Some yard work Not crying or marked depression.  Not sad.  Has interests. Sleep is good. Anxierty with losing things or crowds or rushed. Clonazepam to go out. Not daily. Plan: No med changes, continue sertraline 200 mg and Adderall 10 mg 3 times daily, trazodone 300 mg HS, Buspirone 30 BID, clonazepam 0.5 mg prn  01/28/2022 appointment with the following noted: Average clonazepam up to 2 daily and some days none. Allergy problems. Some ups and downs since here but overall OK. Consistent with meds without problems. No SE 1-2 panic since here. Clonazepam helps. Sleep good with trazodone 10 hours.  Like to have my sleep. Lonely around holidays. Plan: no med changes. Trazodone helps sleep. 300 HS Continue sertraline 200 mg daily. Buspirone 30 BID  Adderall 10 TID  Continue prn clonazepam.  07/31/22 appt noted: late bc wrong turn Overall ok with good days and bad days.only 1-2 days down at time.  Hard to tell.  Anxiety will make head sweating worse and always had this.  Hard on herself.  Still in cluttered mess.  Enjoys cats and dog. Stay home so don't go buy things and add to clutter. Consistent with meds. No med concerns. No SE with meds. Occ panic.  Last in a couple of weeks and manages with breathing techniques. Married 3 times.   Past Psychiatric Medication Trials: Patient reportedly been on Adderall for about 25 years.   Clonazepam 0.5, lorazepam buspirone 30 twice daily, sertraline 200,  Wellbutrin, citalopram, trazodone,  gabapentin, hydroxyzine, Lunesta,  olanzapine, Abilify, Saphris,   Review of Systems:  Review of Systems  Cardiovascular:  Negative for palpitations.  Gastrointestinal:  Negative for abdominal pain and diarrhea.  Musculoskeletal:  Positive for arthralgias.  Neurological:  Negative for tremors and weakness.  Psychiatric/Behavioral:  Positive for depression. The patient is nervous/anxious.   GI problems diet related in part  Medications: I have reviewed the patient's current medications.  Current Outpatient Medications  Medication Sig Dispense Refill   antiseptic oral rinse (BIOTENE) LIQD 15 mLs by Mouth Rinse route as needed for dry mouth (uses when she brushes her teeth).     clonazePAM (KLONOPIN) 0.5 MG tablet Take 1 tablet (0.5 mg total) by mouth 3 (three) times daily. (Patient taking differently: Take 0.5 mg by mouth 3 (three) times daily. PRN) 60 tablet 4   clotrimazole-betamethasone (LOTRISONE) cream APPLY TO AFFECTED AREA TOPICALLY 3 TIMES A DAY FOR 3 MONTHS     cycloSPORINE (RESTASIS) 0.05 % ophthalmic emulsion 1 drop 2 (two) times daily.     dexlansoprazole (DEXILANT) 60 MG capsule Take 1 capsule (60 mg total) by mouth daily. 30 capsule 6   famotidine (PEPCID) 40 MG tablet Take 40 mg by mouth 2 (two) times daily.     gabapentin (NEURONTIN) 100 MG capsule TAKE 1 CAPSULE BY MOUTH TWICE A DAY 180 capsule 0   Hypromellose 0.3 % SOLN Apply 1 drop to eye 2 (two) times daily.     Omega-3 Fatty Acids (FISH OIL) 1000 MG CAPS Take 1,000 mg by mouth 2 (two) times daily.     ondansetron (ZOFRAN) 4 MG tablet Take 1 tablet (4 mg total) by mouth every 8 (eight) hours as needed for nausea or vomiting. 20 tablet 0   PREVIDENT 5000 DRY MOUTH 1.1 % GEL dental gel      rifaximin (XIFAXAN) 550 MG TABS tablet Take 1 tablet (550 mg total) by mouth 3 (three) times daily. 42 tablet 0   valsartan-hydrochlorothiazide (DIOVAN-HCT) 160-25 MG tablet Take 1 tablet by mouth every morning.      amphetamine-dextroamphetamine (ADDERALL) 10 MG tablet Take 1 tablet (10 mg total) by mouth 3 (three) times daily. 270 tablet 0   amphetamine-dextroamphetamine (ADDERALL) 30 MG tablet 1/4 tablet 3 times daily (Patient not taking: Reported on 07/31/2022) 23 tablet 0   busPIRone (BUSPAR) 30 MG tablet Take 1 tablet (30 mg total) by mouth 2 (two) times daily. 180 tablet 1   sertraline (ZOLOFT) 100 MG tablet Take 2 tablets (200 mg total) by mouth daily. 180 tablet 1   traZODone (DESYREL) 150 MG tablet Take 1-2 tablets (150-300 mg total) by mouth daily. 180 tablet 1   No current facility-administered medications for this visit.    Medication Side Effects: None  Allergies:  Allergies  Allergen Reactions   Morphine Other (See Comments)    REACTION: paranoid    Past Medical History:  Diagnosis Date   ADD (attention deficit disorder)    Anemia, normocytic normochromic 12/25/2012   Mild, chronic, Hb 11-12 grams at least since 11/2006;  screening studies & endoscopies unremarkable   Anxiety    Back pain    Breast cancer ([facility]) 2007   Chronic kidney disease    hx of stage 3 kidney disease    Depression    Diverticulosis of colon (without mention of hemorrhage)    Esophageal reflux    H/O dehydration    Headache    PMH   Heart murmur    History of blood transfusion    History of radiation therapy    Hypercholesterolemia    Hypertension    Irritable bowel syndrome    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 ([facility]) 12/24/2013   Osteopenia    Ovarian cystic mass 07/07/2015   Personal history of radiation therapy 2008   PONV (postoperative nausea and vomiting)    also had severe itching    Pyloric stenosis    Repeated falls    Shortness of breath dyspnea    walking distances or climbing stairs    Family History  Problem Relation Age of Onset   Other Father        colon rupture   Pancreatic cancer Cousin    Diabetes Maternal Aunt    Breast cancer Maternal Aunt     Prostate cancer Maternal Grandfather    Breast cancer Maternal Aunt        X 3   Breast cancer Maternal Aunt     Social History   Socioeconomic History   Marital status: Divorced    Spouse name: Not on file   Number of children: 0   Years of education: Not on file   Highest education level: Not on file  Occupational History   Occupation: Retired Product manager: RETIRED  Tobacco Use   Smoking status: Never   Smokeless tobacco: Never  Vaping Use   Vaping Use: Never used  Substance and Sexual Activity   Alcohol use: Yes    Alcohol/week: 0.0 standard drinks of alcohol    Comment: rarely   Drug use: No   Sexual activity: Not on file  Other Topics Concern   Not on file  Social History Narrative   She is divorced. No children. She is a retired Statistician.    Lives alone.   One caffeinated beverage daily.      Social Determinants of Health   Financial Resource Strain: Not on file  Food Insecurity: Not on file  Transportation Needs: Not on file  Physical Activity: Not on file  Stress: Not on file  Social Connections: Not on file  Intimate Partner Violence: Not on file    Past Medical History, Surgical history, Social history, and Family history were reviewed and updated as appropriate.   Lives now with man shes dated for 20 years who moved in due to PD  Please see review of systems for further details on the patient's review from today.   Objective:   Physical Exam:  BP 105/63   Pulse (!) 102   Physical Exam Constitutional:      General: She is not in acute distress.    Appearance: She is well-developed. She is obese.  Musculoskeletal:        General: No deformity.  Neurological:     Mental Status: She is alert and oriented to person, place, and time.     Cranial Nerves: No dysarthria.     Coordination: Coordination normal.  Psychiatric:        Attention and Perception: She is attentive.  Mood and Affect: Mood is anxious. Mood is not  depressed. Affect is not labile, blunt, angry, tearful or inappropriate.        Speech: Speech normal. Speech is not rapid and pressured or slurred.        Behavior: Behavior normal.        Thought Content: Thought content normal. Thought content is not delusional. Thought content does not include homicidal or suicidal ideation. Thought content does not include suicidal plan.        Cognition and Memory: Cognition normal.        Judgment: Judgment normal.     Comments: Insight is fair.    sitational depression generally mild. Residual chronic anxiety     Lab Review:     Component Value Date/Time   NA 142 01/15/2017 0901   K 4.2 01/15/2017 0901   CL 105 10/13/2015 0440   CO2 25 01/15/2017 0901   GLUCOSE 100 01/15/2017 0901   BUN 22.3 01/15/2017 0901   CREATININE 1.3 (H) 01/15/2017 0901   CALCIUM 9.3 01/15/2017 0901   PROT 6.6 01/15/2017 0901   ALBUMIN 3.6 01/15/2017 0901   AST 14 01/15/2017 0901   ALT 10 01/15/2017 0901   ALKPHOS 65 01/15/2017 0901   BILITOT 0.27 01/15/2017 0901   GFRNONAA >60 10/13/2015 0440   GFRAA >60 10/13/2015 0440       Component Value Date/Time   WBC 6.0 01/15/2017 0901   WBC 9.6 10/13/2015 0440   RBC 3.55 (L) 01/15/2017 0901   RBC 3.13 (L) 10/13/2015 0440   HGB 10.1 (L) 01/15/2017 0901   HCT 31.5 (L) 01/15/2017 0901   PLT 177 01/15/2017 0901   MCV 88.7 01/15/2017 0901   MCH 28.5 01/15/2017 0901   MCH 30.4 10/13/2015 0440   MCHC 32.1 01/15/2017 0901   MCHC 32.9 10/13/2015 0440   RDW 14.1 01/15/2017 0901   LYMPHSABS 2.4 01/15/2017 0901   MONOABS 0.6 01/15/2017 0901   EOSABS 0.5 01/15/2017 0901   BASOSABS 0.0 01/15/2017 0901    No results found for: "POCLITH", "LITHIUM"   No results found for: "PHENYTOIN", "PHENOBARB", "VALPROATE", "CBMZ"   .res Assessment: Plan:    Bryann was seen today for follow-up, depression, anxiety and adhd.  Diagnoses and all orders for this visit:  Panic disorder with agoraphobia -     sertraline  (ZOLOFT) 100 MG tablet; Take 2 tablets (200 mg total) by mouth daily.  Generalized anxiety disorder -     busPIRone (BUSPAR) 30 MG tablet; Take 1 tablet (30 mg total) by mouth 2 (two) times daily. -     sertraline (ZOLOFT) 100 MG tablet; Take 2 tablets (200 mg total) by mouth daily.  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  Attention deficit hyperactivity disorder (ADHD), combined type  Insomnia due to mental condition -     traZODone (DESYREL) 150 MG tablet; Take 1-2 tablets (150-300 mg total) by mouth daily.  Hoarding disorder  Attention deficit disorder, unspecified hyperactivity presence -     amphetamine-dextroamphetamine (ADDERALL) 10 MG tablet; Take 1 tablet (10 mg total) by mouth 3 (three) times daily.    Greater than 50% of 30 min face to face time with patient was spent on counseling and coordination of care. We discussed diagnoses to which should be added hoarding.  Struggling with this ongoing.  We discussed the short-term risks associated with benzodiazepines including  sedation and increased fall risk among others.  Discussed long-term side effect risk including dependence, potential withdrawal symptoms, and the potential eventual dose-related risk of dementia.  But recent studies from 2020 dispute this association between benzodiazepines and dementia risk. Newer studies in 2020 do not support an association with dementia.  Not ideal to take stimulants and clonazepam but needs BZ to leave the house.  Fearful.  Adderall helps function. OK  Adderall 10 TID to prolong productivity period which could also help mood. Some avoidance.  Discussed potential benefits, risks, and side effects of stimulants with patient to include increased heart rate, palpitations, insomnia, increased anxiety, increased irritability, or decreased appetite.  Instructed patient to contact office if  experiencing any significant tolerability issues.  Supportive therapy dealing with broken relationship and loneliness.  Disc hoarding and pushing herself through procrastination and hoarding.  She is making some progress since here. Encourage activity and return to [employer].  Trazodone helps sleep. 300 HS Continue sertraline 200 mg daily. Buspirone 30 BID  Adderall 10 TID  Continue prn clonazepam.  Benefit and tolerating meds.  FU 6 mos  Lynder Parents, MD, DFAPA   Please see After Visit Summary for patient specific instructions.  No future appointments.  No orders of the defined types were placed in this encounter.    -------------------------------

## 2022-09-17 DIAGNOSIS — F339 Major depressive disorder, recurrent, unspecified: Secondary | ICD-10-CM | POA: Diagnosis not present

## 2022-09-17 DIAGNOSIS — Z0000 Encounter for general adult medical examination without abnormal findings: Secondary | ICD-10-CM | POA: Diagnosis not present

## 2022-09-17 DIAGNOSIS — E7800 Pure hypercholesterolemia, unspecified: Secondary | ICD-10-CM | POA: Diagnosis not present

## 2022-09-17 DIAGNOSIS — Z853 Personal history of malignant neoplasm of breast: Secondary | ICD-10-CM | POA: Diagnosis not present

## 2022-09-17 DIAGNOSIS — R531 Weakness: Secondary | ICD-10-CM | POA: Diagnosis not present

## 2022-09-17 DIAGNOSIS — N1832 Chronic kidney disease, stage 3b: Secondary | ICD-10-CM | POA: Diagnosis not present

## 2022-09-17 DIAGNOSIS — I10 Essential (primary) hypertension: Secondary | ICD-10-CM | POA: Diagnosis not present

## 2022-09-17 DIAGNOSIS — K219 Gastro-esophageal reflux disease without esophagitis: Secondary | ICD-10-CM | POA: Diagnosis not present

## 2022-09-17 DIAGNOSIS — M8580 Other specified disorders of bone density and structure, unspecified site: Secondary | ICD-10-CM | POA: Diagnosis not present

## 2022-10-21 ENCOUNTER — Other Ambulatory Visit: Payer: Self-pay

## 2022-10-21 ENCOUNTER — Telehealth: Payer: Self-pay | Admitting: Psychiatry

## 2022-10-21 ENCOUNTER — Other Ambulatory Visit: Payer: Self-pay | Admitting: Physician Assistant

## 2022-10-21 DIAGNOSIS — F988 Other specified behavioral and emotional disorders with onset usually occurring in childhood and adolescence: Secondary | ICD-10-CM

## 2022-10-21 DIAGNOSIS — Z1231 Encounter for screening mammogram for malignant neoplasm of breast: Secondary | ICD-10-CM

## 2022-10-21 MED ORDER — AMPHETAMINE-DEXTROAMPHETAMINE 10 MG PO TABS: 10.0000 mg | ORAL_TABLET | Freq: Three times a day (TID) | ORAL | 0 refills | Status: DC

## 2022-10-21 NOTE — Telephone Encounter (Signed)
Pended.

## 2022-10-21 NOTE — Telephone Encounter (Signed)
Next appt is 01/29/23. Kissy called requesting a refill on Adderall 10 mg called to:  [facility] [id number] - [place], [place]   Phone: [phone]  Fax: [phone]    Per Jacqlyn Larsen it is in stock.

## 2022-11-05 ENCOUNTER — Encounter: Payer: Self-pay | Admitting: Family Medicine

## 2022-11-05 ENCOUNTER — Ambulatory Visit: Payer: Medicare PPO | Admitting: Family Medicine

## 2022-11-05 VITALS — BP 156/70 | HR 75 | Temp 97.5°F | Ht 61.0 in | Wt 145.5 lb

## 2022-11-05 DIAGNOSIS — R531 Weakness: Secondary | ICD-10-CM

## 2022-11-05 DIAGNOSIS — I10 Essential (primary) hypertension: Secondary | ICD-10-CM | POA: Diagnosis not present

## 2022-11-05 DIAGNOSIS — M25511 Pain in right shoulder: Secondary | ICD-10-CM | POA: Diagnosis not present

## 2022-11-05 DIAGNOSIS — R200 Anesthesia of skin: Secondary | ICD-10-CM | POA: Diagnosis not present

## 2022-11-05 DIAGNOSIS — R269 Unspecified abnormalities of gait and mobility: Secondary | ICD-10-CM | POA: Diagnosis not present

## 2022-11-05 DIAGNOSIS — R202 Paresthesia of skin: Secondary | ICD-10-CM

## 2022-11-05 DIAGNOSIS — F411 Generalized anxiety disorder: Secondary | ICD-10-CM | POA: Diagnosis not present

## 2022-11-05 DIAGNOSIS — E7849 Other hyperlipidemia: Secondary | ICD-10-CM

## 2022-11-05 DIAGNOSIS — M2550 Pain in unspecified joint: Secondary | ICD-10-CM | POA: Diagnosis not present

## 2022-11-05 DIAGNOSIS — K219 Gastro-esophageal reflux disease without esophagitis: Secondary | ICD-10-CM

## 2022-11-05 DIAGNOSIS — R252 Cramp and spasm: Secondary | ICD-10-CM

## 2022-11-05 LAB — MAGNESIUM: Magnesium: 1.7 mg/dL (ref 1.5–2.5)

## 2022-11-05 LAB — BASIC METABOLIC PANEL
BUN: 18 mg/dL (ref 6–23)
CO2: 26 mEq/L (ref 19–32)
Calcium: 9 mg/dL (ref 8.4–10.5)
Chloride: 104 mEq/L (ref 96–112)
Creatinine, Ser: 1.05 mg/dL (ref 0.40–1.20)
GFR: 52.1 mL/min — ABNORMAL LOW (ref >60.00)
Glucose, Bld: 101 mg/dL — ABNORMAL HIGH (ref 70–99)
Potassium: 3.9 mEq/L (ref 3.5–5.1)
Sodium: 140 mEq/L (ref 135–145)

## 2022-11-05 LAB — VITAMIN D 25 HYDROXY (VIT D DEFICIENCY, FRACTURES): VITD: 18 ng/mL — ABNORMAL LOW (ref 30.00–100.00)

## 2022-11-05 LAB — VITAMIN B12: Vitamin B-12: 252 pg/mL (ref 211–911)

## 2022-11-05 LAB — SEDIMENTATION RATE: Sed Rate: 17 mm/hr (ref 0–30)

## 2022-11-05 LAB — C-REACTIVE PROTEIN: CRP: <1 mg/dL (ref 0.5–20.0)

## 2022-11-05 NOTE — Progress Notes (Signed)
New Patient Office Visit  Subjective:  Patient ID: Gloria Fields, female    DOB: [birth year]  Age: 76 y.o. MRN: [id number]  CC:  Chief Complaint  Patient presents with   Establish Care    Need new pcp, wanted a MD Fasting  Need order for bone density       HPI Gloria Fields presents for new pt-HTN.  A lot of concerns.  Prev PCP [facility]  HTN-Pt is on diovan/HCT 160/25 per chart and her med list but pt thinks no diuretic. Forgot to take today. states Bp's running -not checking .  No dizziness/cp/palp/cough/sob.   Occ edema  GERD-was seeing GI.  Has been on abx for "lower intestines". No dysphagia. ADD/Anx/Depression-sees MH- Concerned about aches and pains,  all over body.  Seems to cycle.  Mainly upper body but any 5.  R shoulder pain--x 1 mo.  No injury.  But did fall(tripped over feet) 1 mo ago and hit L eye-very bruised face-better.  That is when shoulder pain started.  Sometimes, shoulder pain causing trouble lifting it.  Throbbing at HS.  No rad.    6.  Numbness-hands and feet-can be one or all-going on awhile. 7.  Balance off for 52yr-walks all over the place.  Not constant. Not dizzy.  Can be shakey and jittery-thinks anxiety.  Will occur if dehydrated.  No w/u or tx.  Labs 1 mo ago. "Fine x chol".  Hard to take trash out-has lost control of can and dumped trash 8.  HLD-not taking statins-declines-dad would get 105 degree temps and sweat and ache.  No exercise.  9.  Night sweats for 146yr  Wakes up about 6-7:00 in am in a sweat, not nightly.  No pattern.  Can have bad dreams. 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  10.  Chronic fatigue.  Some leg cramps-can wake her up.    Past Medical History:  Diagnosis Date   ADD (attention deficit disorder)    Allergy    Anemia, normocytic normochromic 12/25/2012   Mild, chronic, Hb 11-12 grams at least since 11/2006; screening studies & endoscopies unremarkable   Anxiety    Back pain    [facility] [place]   Chronic kidney disease    hx of stage 3 kidney disease    Depression    Diverticulosis of colon (without mention of hemorrhage)    Esophageal reflux    H/O dehydration    Headache    PMH   Heart murmur    History of blood transfusion    History of radiation therapy    Hypercholesterolemia    Hypertension    Irritable bowel syndrome    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   Osteopenia    Ovarian cystic mass 07/07/2015   Personal history of radiation therapy 2008   PONV (postoperative nausea and vomiting)    also had severe itching    Pyloric stenosis    Repeated falls    Shortness of breath dyspnea    walking distances or climbing stairs    Past Surgical History:  Procedure Laterality Date   APPENDECTOMY  2007   perforated appendicitis   BREAST LUMPECTOMY  2007   right   COLONOSCOPY     COLONOSCOPY WITH PROPOFOL N/A 07/07/2015   Procedure: COLONOSCOPY WITH PROPOFOL;  Surgeon: CarGatha MayerD;  [facility];  Laterality: N/A;  NEED ULTRA SLIM COLONOSCOPE PLEASE, possible pyloric  dilation   ESOPHAGOGASTRODUODENOSCOPY     ESOPHAGOGASTRODUODENOSCOPY N/A 07/07/2015   Procedure: ESOPHAGOGASTRODUODENOSCOPY (EGD);  Surgeon: Gatha Mayer, MD;  [facility] END[facility]COPY;  Service: Endoscopy;  Laterality: N/A;  POSSIBLE PYLORIC DILATION   HERNIA REPAIR Left    L and ventral   LAPAROSCOPIC BILATERAL SALPINGO OOPHERECTOMY Bilateral 10/12/2015   Procedure: LAPAROSCOPIC  LEFT SALPINGO OOPHORECTOMY AND LYSIS OF ADHESIONS ;  Surgeon: Everitt Amber, MD;  [facility]: [facility];  Service: Gynecology;  Laterality: Bilateral;   LAPAROSCOPIC LYSIS OF ADHESIONS  10/29/2012   Procedure: LAPAROSCOPIC LYSIS OF ADHESIONS;  Surgeon: Pedro Earls, MD;  Location: [facility];  Service: General;;   UPPER GASTROINTESTINAL ENDOSCOPY  Multiple   UTERINE FIBROID SURGERY     with subsequent scar tissue removal   VENTRAL HERNIA REPAIR   10/29/2012   Procedure: LAPAROSCOPIC VENTRAL HERNIA;  Surgeon: Pedro Earls, MD;  Location: [facility];  Service: General;  Laterality: N/A;  [place] LYSIS OF ADHESIONS    VENTRAL HERNIA REPAIR N/A 10/12/2015   Procedure: LAPAROSCOPIC VENTRAL HERNIA REPAIR ;  Surgeon: Johnathan Hausen, MD;  Location: [facility];  Service: General;  Laterality: N/A;   WISDOM TOOTH EXTRACTION      Family History  Problem Relation Age of Onset   50 / [place] Mother    Hearing loss Father    Arthritis Father    Other Father        colon rupture   Diabetes Maternal Aunt    Breast cancer Maternal Aunt    Breast cancer Maternal Aunt        X 3   Breast cancer Maternal Aunt    Cancer Maternal Grandfather    Prostate cancer Maternal Grandfather    Arthritis Paternal Grandmother    Early death Paternal Grandfather    Drug abuse Paternal Grandfather    Pancreatic cancer Cousin     Social History   Socioeconomic History   Marital status: Divorced    Spouse name: Not on file   Number of children: 0   Years of education: Not on file   Highest education level: Not on file  Occupational History   Occupation: Retired Product manager: RETIRED  Tobacco Use   Smoking status: Never   Smokeless tobacco: Never  Vaping Use   Vaping Use: Never used  Substance and Sexual Activity   Alcohol use: Yes    Alcohol/week: 0.0 standard drinks of alcohol    Comment: rarely   Drug use: No   Sexual activity: Not Currently  Other Topics Concern   Not on file  Social History Narrative   She is divorced. No children. She is a retired Statistician.  infertility   Lives alone.   One caffeinated beverage daily.      Social Determinants of Health   Financial Resource Strain: Not on file  Food Insecurity: Not on file  Transportation Needs: Not on file  Physical Activity: Not on file  Stress: Not on file  Social Connections: Not on file   Intimate Partner Violence: Not on file    ROS  ROS: Gen: no fever, chills  Skin: no rash, itching ENT: no ear pain, ear drainage, nasal congestion, rhinorrhea, sinus pressure, sore throat Eyes: no blurry vision, double vision Resp: no cough, wheeze,SOB CV: no CP, palpitations,  GI: no heartburn, n/v/d/c, abd pain GU: no dysuria, urgency, frequency, hematuria MSK: no joint pain, myalgias, back pain Neuro: no dizziness, headache,  weakness, vertigo Psych: no depression, anxiety, insomnia, SI   Objective:   Today's Vitals: BP (!) 156/70 (BP Location: Left Arm, Patient Position: Sitting, Cuff Size: Normal)   Pulse 75   Temp (!) 97.5 F (36.4 C) (Temporal)   Ht '5\' 1"'$  (1.549 m)   Wt 145 lb 8 oz (66 kg)   SpO2 96%   BMI 27.49 kg/m   Physical Exam  Gen: WDWN NAD wf HEENT: NCAT, conjunctiva not injected, sclera nonicteric  NECK:  supple, no thyromegaly, no nodes, no carotid bruits CARDIAC: RRR, S1S2+, +1-2/6 murmur. DP 1+B LUNGS: CTAB. No wheezes EXT:  no edema MSK: no gross abnormalities. MS 4+/5 all 4 NEURO: A&O x3.  CN II-XII intact.  PSYCH: normal mood. Good eye contact   Assessment & Plan:   Problem List Items Addressed This Visit       Cardiovascular and Mediastinum   Primary hypertension - Primary     Digestive   GERD (gastroesophageal reflux disease)   Relevant Orders   Vitamin B12 (Completed)   Magnesium (Completed)     Other   Anxiety disorder   Gait abnormality   Relevant Orders   Ambulatory referral to Physical Therapy   Polyarthralgia   Other Visit Diagnoses     Numbness and tingling       Relevant Orders   Vitamin B12 (Completed)   Sedimentation rate (Completed)   C-reactive protein (Completed)   Leg cramps       Relevant Orders   Basic metabolic panel (Completed)   VITAMIN D 25 Hydroxy (Vit-D Deficiency, Fractures) (Completed)   Weakness       Relevant Orders   Sedimentation rate (Completed)   C-reactive protein (Completed)   Acute  pain of right shoulder       Relevant Orders   Ambulatory referral to Physical Therapy   Other hyperlipidemia         1.  Hypertension-chronic.  Not controlled today.  She did not take her medicine today.  I am not quite sure what medicine she is taking.  Advised to do blood pressure checks at home and let us know what her medication is.  Will request records from her previous PCP is labs just done about a month ago.  Follow-up in 1 month 2.  GERD-chronic.  Fairly well-controlled on Dexilant 60 mg daily.  Still having some other GI complaints.  Will address more at next visit. 3.  Anxiety/depression/ADHD-chronic.  Currently fairly stable.  Managed by mental health. 4.  Numbness tingling hands and feet-question neuropathy, B12 deficiency, other.  Will check B12 levels, sed rate, CRP 5.  Leg cramps-more at night, but can be daytime as well.  Advised to send me her blood pressure medication name.  Will check BMP, vitamin D.  Advised to take magnesium daily.  Will see if potassium is low.  Get labs from previous PCP. 6.  Polyarthralgias-will get copy of labs from previous physician.  Advised to see sports medicine-number and address given to the patient. 7.  Pain right shoulder-seems to have started after a fall, however denies injury to the shoulder specifically.  Refer to sports medicine.  Patient will call. 8.  "Weakness".  Can be arms or legs.  Will check sed rate and CRP. 9.  Gait instability-not sure if due to arthralgias, neuralgias, vitamin B deficiency, meds, other.  Will refer to physical therapy.  Check B12 levels 10.  Hyperlipidemia-chronic.  Patient would like to work on diet and exercise.  She  adamantly refuses statins due to problems her dad had with them.  Will get copy of labs from previous PCP  Complex patient with multiple ongoing and new problems.  She understands this will take time to sort out, figure out what else to do.  She is happy that at least were going to start addressing  these things.  Follow-up in 1 month  Outpatient Encounter Medications as of 11/05/2022  Medication Sig   amphetamine-dextroamphetamine (ADDERALL) 10 MG tablet Take 1 tablet (10 mg total) by mouth 3 (three) times daily.   antiseptic oral rinse (BIOTENE) LIQD 15 mLs by Mouth Rinse route as needed for dry mouth (uses when she brushes her teeth).   busPIRone (BUSPAR) 30 MG tablet Take 1 tablet (30 mg total) by mouth 2 (two) times daily.   clonazePAM (KLONOPIN) 0.5 MG tablet Take 1 tablet (0.5 mg total) by mouth 3 (three) times daily. (Patient taking differently: Take 0.5 mg by mouth 3 (three) times daily. PRN)   clotrimazole-betamethasone (LOTRISONE) cream APPLY TO AFFECTED AREA TOPICALLY 3 TIMES A DAY FOR 3 MONTHS   cycloSPORINE (RESTASIS) 0.05 % ophthalmic emulsion 1 drop 2 (two) times daily.   dexlansoprazole (DEXILANT) 60 MG capsule Take 1 capsule (60 mg total) by mouth daily.   gabapentin (NEURONTIN) 100 MG capsule TAKE 1 CAPSULE BY MOUTH TWICE A DAY   Hypromellose 0.3 % SOLN Apply 1 drop to eye 2 (two) times daily.   Omega-3 Fatty Acids (FISH OIL) 1000 MG CAPS Take 1,000 mg by mouth 2 (two) times daily.   ondansetron (ZOFRAN) 4 MG tablet Take 1 tablet (4 mg total) by mouth every 8 (eight) hours as needed for nausea or vomiting.   PREVIDENT 5000 DRY MOUTH 1.1 % GEL dental gel    sertraline (ZOLOFT) 100 MG tablet Take 2 tablets (200 mg total) by mouth daily.   traZODone (DESYREL) 150 MG tablet Take 1-2 tablets (150-300 mg total) by mouth daily.   [DISCONTINUED] valsartan-hydrochlorothiazide (DIOVAN-HCT) 160-25 MG tablet Take 1 tablet by mouth every morning.   [DISCONTINUED] amphetamine-dextroamphetamine (ADDERALL) 30 MG tablet 1/4 tablet 3 times daily (Patient not taking: Reported on 07/31/2022)   [DISCONTINUED] famotidine (PEPCID) 40 MG tablet Take 40 mg by mouth 2 (two) times daily.   [DISCONTINUED] rifaximin (XIFAXAN) 550 MG TABS tablet Take 1 tablet (550 mg total) by mouth 3 (three) times  daily.   No facility-administered encounter medications on file as of 11/05/2022.    Follow-up: Return in about 4 weeks (around 12/03/2022) for mult.   Wellington [place], MD

## 2022-11-05 NOTE — Progress Notes (Signed)
Vitamin d is low-send in 50K ui/wk #12/0 B12 is low end - take 1071mg/day otc

## 2022-11-05 NOTE — Patient Instructions (Addendum)
Welcome to [facility]! It was a pleasure meeting you today.  As discussed, Please schedule a 1 month follow up visit today. Call us with blood pressure meds- check blood pressures.  Take B complex, magnesium '250mg'$  daily, multivitamin.   Drink plenty of water.  Stretches.   [facility] at [facility] on the 1st floor Phone number [phone]   PLEASE NOTE:  If you had any LAB tests please let us know if you have not heard back within a few days. You may see your results on MyChart before we have a chance to review them but we will give you a call once they are reviewed by [place]. If we ordered any REFERRALS today, please let us know if you have not heard from their office within the next week.  Let us know through MyChart if you are needing REFILLS, or have your pharmacy send [place] the request. You can also use MyChart to communicate with me or any office staff.  Please try these tips to maintain a healthy lifestyle:  Eat most of your calories during the day when you are active. Eliminate processed foods including packaged sweets (pies, cakes, cookies), reduce intake of potatoes, white bread, white pasta, and white rice. Look for whole grain options, oat flour or almond flour.  Each meal should contain half fruits/vegetables, one quarter protein, and one quarter carbs (no bigger than a computer mouse).  Cut down on sweet beverages. This includes juice, soda, and sweet tea. Also watch fruit intake, though this is a healthier sweet option, it still contains natural sugar! Limit to 3 servings daily.  Drink at least 1 glass of water with each meal and aim for at least 8 glasses per day  Exercise at least 150 minutes every week.

## 2022-11-06 ENCOUNTER — Other Ambulatory Visit: Payer: Self-pay | Admitting: *Deleted

## 2022-11-06 MED ORDER — VITAMIN D (ERGOCALCIFEROL) 1.25 MG (50000 UNIT) PO CAPS: 50000.0000 [IU] | ORAL_CAPSULE | ORAL | 0 refills | Status: DC

## 2022-12-04 ENCOUNTER — Ambulatory Visit: Payer: Medicare PPO | Admitting: Physical Therapy

## 2022-12-11 ENCOUNTER — Ambulatory Visit: Payer: Medicare PPO | Admitting: Physical Therapy

## 2022-12-11 DIAGNOSIS — R2689 Other abnormalities of gait and mobility: Secondary | ICD-10-CM

## 2022-12-11 DIAGNOSIS — M6281 Muscle weakness (generalized): Secondary | ICD-10-CM

## 2022-12-11 NOTE — Therapy (Signed)
OUTPATIENT PHYSICAL THERAPY LOWER EXTREMITY EVALUATION   Patient Name: Amorita Vanrossum MRN: [id number] DOB:1945-12-02, 77 y.o., female Today's Date: 12/11/2022  END OF SESSION:  PT End of Session - 12/13/22 1328     Visit Number 1    Number of Visits 16    Date for PT Re-Evaluation 02/05/23    Authorization Type [employer]    PT Start Time 0930    PT Stop Time 1013    PT Time Calculation (min) 43 min    Equipment Utilized During Treatment Gait belt    Activity Tolerance Patient tolerated treatment well    Behavior During Therapy WFL for tasks assessed/performed             Past Medical History:  Diagnosis Date   ADD (attention deficit disorder)    Allergy    Anemia, normocytic normochromic 12/25/2012   Mild, chronic, Hb 11-12 grams at least since 11/2006; screening studies & endoscopies unremarkable   Anxiety    Back pain    Breast cancer ([place]) 2007   Chronic kidney disease    hx of stage 3 kidney disease    Depression    Diverticulosis of colon (without mention of hemorrhage)    Esophageal reflux    H/O dehydration    Headache    PMH   Heart murmur    History of blood transfusion    History of radiation therapy    Hypercholesterolemia    Hypertension    Irritable bowel syndrome    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 ([place]) 12/24/2013   Osteopenia    Ovarian cystic mass 07/07/2015   Personal history of radiation therapy 2008   PONV (postoperative nausea and vomiting)    also had severe itching    Pyloric stenosis    Repeated falls    Shortness of breath dyspnea    walking distances or climbing stairs   Past Surgical History:  Procedure Laterality Date   APPENDECTOMY  2007   perforated appendicitis   BREAST LUMPECTOMY  2007   right   COLONOSCOPY     COLONOSCOPY WITH PROPOFOL N/A 07/07/2015   Procedure: COLONOSCOPY WITH PROPOFOL;  Surgeon: Gatha Mayer, MD;  Location: [facility] ENDOSCOPY;  Service: Endoscopy;  Laterality: N/A;   NEED ULTRA SLIM COLONOSCOPE PLEASE, possible pyloric dilation   ESOPHAGOGASTRODUODENOSCOPY     ESOPHAGOGASTRODUODENOSCOPY N/A 07/07/2015   Procedure: ESOPHAGOGASTRODUODENOSCOPY (EGD);  Surgeon: Gatha Mayer, MD;  [facility];  Service: Endoscopy;  Laterality: N/A;  POSSIBLE PYLORIC DILATION   HERNIA REPAIR Left    L and ventral   LAPAROSCOPIC BILATERAL SALPINGO OOPHERECTOMY Bilateral 10/12/2015   Procedure: LAPAROSCOPIC  LEFT SALPINGO OOPHORECTOMY AND LYSIS OF ADHESIONS ;  Surgeon: Everitt Amber, MD;  Location: [facility];  Service: Gynecology;  Laterality: Bilateral;   LAPAROSCOPIC LYSIS OF ADHESIONS  10/29/2012   Procedure: LAPAROSCOPIC LYSIS OF ADHESIONS;  Surgeon: Pedro Earls, MD;  Location: [facility];  Service: General;;   UPPER GASTROINTESTINAL ENDOSCOPY  Multiple   UTERINE FIBROID SURGERY     with subsequent scar tissue removal   VENTRAL HERNIA REPAIR  10/29/2012   Procedure: LAPAROSCOPIC VENTRAL HERNIA;  Surgeon: Pedro Earls, MD;  Location: [facility];  Service: General;  Laterality: N/A;  [place], [place] LYSIS OF ADHESIONS    VENTRAL HERNIA REPAIR N/A 10/12/2015   Procedure: LAPAROSCOPIC VENTRAL HERNIA REPAIR ;  Surgeon: Johnathan Hausen, MD;  Location: [facility];  Service: General;  Laterality:  N/A;   WISDOM TOOTH EXTRACTION     Patient Active Problem List   Diagnosis Date Noted   Primary hypertension 11/05/2022   Gait abnormality 11/05/2022   Polyarthralgia 11/05/2022   Anxiety state 09/21/2018   Depression 09/21/2018   ADD (attention deficit disorder) 09/21/2018   Ventral hernia 10/12/2015   Endometrial stripe increased 07/28/2015   Ovarian cancer screening 07/19/2015   Ovarian cystic mass - left 07/07/2015   Colon cancer screening    Stenosis of colon ([place])    Diverticulosis of colon without hemorrhage    Pyloric stenosis, acquired    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 ([place]) 12/24/2013    Anemia, normocytic normochromic 12/25/2012   Nausea with vomiting 08/28/2011   Esophageal reflux 08/28/2011   Pyloric channel ulcer 08/28/2011   GERD (gastroesophageal reflux disease) 08/15/2011   Depression, major, recurrent ([place]) 08/15/2011   Anxiety disorder 08/15/2011   Diverticulosis of colon (without mention of hemorrhage) 08/15/2011    PCP: Tawnya Crook  REFERRING PROVIDER: Tawnya Crook  REFERRING DIAG: R shoulder pain, gait instability   THERAPY DIAG:  Other abnormalities of gait and mobility  Muscle weakness (generalized)  Rationale for Evaluation and Treatment: Rehabilitation  ONSET DATE: 3 months: shoulder/  10 years: balance  SUBJECTIVE:   SUBJECTIVE STATEMENT: Eval:  R shoulder: getting better, over several months. Pain: 7/10 with use, Likes heat. R handed.  Lives w 5 cats and dog. Stairs: 4 to get in , has 2 rails Has cane and walker, does not use either.  Has only had 1 fall, but feels very wobbly  No neuropathy, but does get leg cramps.  No other knee/hip pain, mild low back pain at times.   PERTINENT HISTORY: breast CA with radiation, HTN, Hernia pain/bulge at times.   PAIN:  Are you having pain? Yes: NPRS scale: 7/10 Pain location: R shoulder Pain description: Sore intermittent Aggravating factors: none stated  Relieving factors: heat  PRECAUTIONS: Fall  WEIGHT BEARING RESTRICTIONS: No  FALLS:  Has patient fallen in last 6 months? Yes. Number of falls 1 No injury.    PLOF: Independent  PATIENT GOALS: improve balance, decreased shoulder pain    OBJECTIVE:   DIAGNOSTIC FINDINGS:   PATIENT SURVEYS:   COGNITION: Overall cognitive status: Within functional limits for tasks assessed     SENSATION: WFL  PALPATION: Pain in posterior shoulder, down into deltoid, Soreness in R UT Mild hypomobility of R shoulder   LOWER EXTREMITY ROM:  Hips/knees: WFL Ankles; mild limitation for EV and DF.   Shoulder AROM:  R: Flexion: 120,  abd: 100,    PROM: flex: 140 L: Flexion: 145, abd: 130,   Strength:   Shoulder:  R  flex, abd: 4/5 L  flex, abd: 4/5    LOWER EXTREMITY SPECIAL TESTS:    FUNCTIONAL TESTS:   [facility] PT Assessment - 12/13/22 0001       Berg Balance Test   Sit to Stand Able to stand  independently using hands    Standing Unsupported Able to stand 2 minutes with supervision    Sitting with Back Unsupported but Feet Supported on Floor or Stool Able to sit safely and securely 2 minutes    Stand to Sit Sits safely with minimal use of hands    Transfers Able to transfer safely, definite need of hands    Standing Unsupported with Eyes Closed Able to stand 10 seconds with supervision    Standing Unsupported with Feet Together Needs  help to attain position and unable to hold for 15 seconds    From Standing, Reach Forward with Outstretched Arm Can reach forward >12 cm safely (5")    From Standing Position, Pick up Object from Floor Unable to pick up and needs supervision    From Standing Position, Turn to Look Behind Over each Shoulder Turn sideways only but maintains balance    Turn 360 Degrees Able to turn 360 degrees safely but slowly    Standing Unsupported, Alternately Place Feet on Step/Stool Able to complete >2 steps/needs minimal assist    Standing Unsupported, One Foot in [employer] balance while stepping or standing    Standing on One Leg Unable to try or needs assist to prevent fall    Total Score 29              GAIT: Distance walked: 50 Assistive device utilized: None Level of assistance: Complete Independence Comments: Shakey legs, decreased stability    TODAY'S TREATMENT:                                                                                                                              DATE:    PATIENT EDUCATION:  Education details: PT POC, Exam findings, HEP Person educated: Patient Education method: Explanation, Demonstration, Tactile cues, Verbal cues, and  Handouts Education comprehension: verbalized understanding, returned demonstration, verbal cues required, tactile cues required, and needs further education  HOME EXERCISE PROGRAM:   ASSESSMENT:  CLINICAL IMPRESSION: Patient presents with primary complaint of increased pain in R shoulder. She has decreased AROM and increased pain with elevation, as well as decreased strength. She also has poor balance, with decreased scoring on balance test today. She is very shakey in thighs, knees and lower legs, which also effects balance. Pt with decreased ability for full functional activities due to pain and deficit, and will benefit from skilled PT to improve.   OBJECTIVE IMPAIRMENTS: Abnormal gait, decreased activity tolerance, decreased balance, decreased coordination, decreased endurance, decreased knowledge of use of DME, decreased mobility, difficulty walking, decreased ROM, decreased strength, increased muscle spasms, and pain.   ACTIVITY LIMITATIONS: carrying, lifting, standing, stairs, transfers, dressing, reach over head, hygiene/grooming, and locomotion level  PARTICIPATION LIMITATIONS: meal prep, cleaning, laundry, shopping, and community activity  PERSONAL FACTORS: Time since onset of injury/illness/exacerbation are also affecting patient's functional outcome.   REHAB POTENTIAL: Good  CLINICAL DECISION MAKING: Stable/uncomplicated  EVALUATION COMPLEXITY: Low   GOALS: Goals reviewed with patient? Yes  SHORT TERM GOALS: Target date: 12/25/22  Pt to be independent with initial HEP  Goal status: INITIAL  2.  Pt to hold rhomberg stance for at least 20 sec.   Goal status: INITIAL  3.  Pt to perform sit to stand without use of UE from regular chair height   Goal status: INITIAL   LONG TERM GOALS: Target date: 02/05/23  Pt to be independent with final HEP  Goal status: INITIAL  2.  Pt to report decreased pain in R shoulder to -3/10 with activity and elevation for improved  ADLs.   Goal status: INITIAL  3.  Pt to demo improved score on BERG by at least 6 points.  :  Goal status: INITIAL  4.  Pt to demo improved static and dynamic balance to be [facility] for pt age. , to improve safety with community activity.   Goal status: INITIAL    PLAN:  PT FREQUENCY: 1-2x/week  PT DURATION: 8 weeks  PLANNED INTERVENTIONS: Therapeutic exercises, Therapeutic activity, Neuromuscular re-education, Balance training, Gait training, Patient/Family education, Self Care, Joint mobilization, Joint manipulation, Orthotic/Fit training, DME instructions, Aquatic Therapy, Dry Needling, Electrical stimulation, Spinal manipulation, Spinal mobilization, Cryotherapy, Moist heat, Taping, Traction, Ultrasound, Ionotophoresis '4mg'$ /ml Dexamethasone, and Manual therapy  PLAN FOR NEXT SESSION: static balance, rhomberg stance, sit to stands, shoulder HEP   Lyndee Hensen, PT, DPT 1:37 PM  12/13/22

## 2022-12-13 ENCOUNTER — Encounter: Payer: Self-pay | Admitting: Physical Therapy

## 2022-12-13 ENCOUNTER — Ambulatory Visit
Admission: RE | Admit: 2022-12-13 | Discharge: 2022-12-13 | Disposition: A | Discharge status: Home / Self Care | Payer: Medicare PPO | Source: Ambulatory Visit | Attending: Physician Assistant | Admitting: Physician Assistant

## 2022-12-13 DIAGNOSIS — Z1231 Encounter for screening mammogram for malignant neoplasm of breast: Secondary | ICD-10-CM

## 2022-12-18 ENCOUNTER — Encounter: Payer: Self-pay | Admitting: Physical Therapy

## 2022-12-18 ENCOUNTER — Ambulatory Visit: Payer: Medicare PPO | Admitting: Physical Therapy

## 2022-12-18 DIAGNOSIS — R2689 Other abnormalities of gait and mobility: Secondary | ICD-10-CM

## 2022-12-18 DIAGNOSIS — M6281 Muscle weakness (generalized): Secondary | ICD-10-CM | POA: Diagnosis not present

## 2022-12-18 NOTE — Therapy (Signed)
OUTPATIENT PHYSICAL THERAPY LOWER EXTREMITY EVALUATION   Patient Name: Gloria Fields MRN: [id number] DOB:1946-07-28, 77 y.o., female Today's Date: 12/11/2022  END OF SESSION:  PT End of Session - 12/18/22 1217     Visit Number 2    Number of Visits 16    Date for PT Re-Evaluation 02/05/23    Authorization Type [employer]    PT Start Time 1220    PT Stop Time 1300    PT Time Calculation (min) 40 min    Equipment Utilized During Treatment Gait belt    Activity Tolerance Patient tolerated treatment well    Behavior During Therapy WFL for tasks assessed/performed             Past Medical History:  Diagnosis Date   ADD (attention deficit disorder)    Allergy    Anemia, normocytic normochromic 12/25/2012   Mild, chronic, Hb 11-12 grams at least since 11/2006; screening studies & endoscopies unremarkable   Anxiety    Back pain    Breast cancer ([place]) 2007   Chronic kidney disease    hx of stage 3 kidney disease    Depression    Diverticulosis of colon (without mention of hemorrhage)    Esophageal reflux    H/O dehydration    Headache    PMH   Heart murmur    History of blood transfusion    History of radiation therapy    Hypercholesterolemia    Hypertension    Irritable bowel syndrome    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 ([place]) 12/24/2013   Osteopenia    Ovarian cystic mass 07/07/2015   Personal history of radiation therapy 2008   PONV (postoperative nausea and vomiting)    also had severe itching    Pyloric stenosis    Repeated falls    Shortness of breath dyspnea    walking distances or climbing stairs   Past Surgical History:  Procedure Laterality Date   APPENDECTOMY  2007   perforated appendicitis   BREAST LUMPECTOMY  2007   right   COLONOSCOPY     COLONOSCOPY WITH PROPOFOL N/A 07/07/2015   Procedure: COLONOSCOPY WITH PROPOFOL;  Surgeon: Gatha Mayer, MD;  Location: [facility] ENDOSCOPY;  Service: Endoscopy;  Laterality: N/A;   NEED ULTRA SLIM COLONOSCOPE PLEASE, possible pyloric dilation   ESOPHAGOGASTRODUODENOSCOPY     ESOPHAGOGASTRODUODENOSCOPY N/A 07/07/2015   Procedure: ESOPHAGOGASTRODUODENOSCOPY (EGD);  Surgeon: Gatha Mayer, MD;  Location: [facility] ENDOSCOPY;  Service: Endoscopy;  Laterality: N/A;  POSSIBLE PYLORIC DILATION   HERNIA REPAIR Left    L and ventral   LAPAROSCOPIC BILATERAL SALPINGO OOPHERECTOMY Bilateral 10/12/2015   Procedure: LAPAROSCOPIC  LEFT SALPINGO OOPHORECTOMY AND LYSIS OF ADHESIONS ;  Surgeon: Everitt Amber, MD;  Location: [facility] ORS;  Service: Gynecology;  Laterality: Bilateral;   LAPAROSCOPIC LYSIS OF ADHESIONS  10/29/2012   Procedure: LAPAROSCOPIC LYSIS OF ADHESIONS;  Surgeon: Pedro Earls, MD;  Location: [facility];  Service: General;;   UPPER GASTROINTESTINAL ENDOSCOPY  Multiple   UTERINE FIBROID SURGERY     with subsequent scar tissue removal   VENTRAL HERNIA REPAIR  10/29/2012   Procedure: LAPAROSCOPIC VENTRAL HERNIA;  Surgeon: Pedro Earls, MD;  Location: [facility];  Service: General;  Laterality: N/A;  [place] LYSIS OF ADHESIONS    VENTRAL HERNIA REPAIR N/A 10/12/2015   Procedure: LAPAROSCOPIC VENTRAL HERNIA REPAIR ;  Surgeon: Johnathan Hausen, MD;  Location: [facility];  Service: General;  Laterality:  N/A;   WISDOM TOOTH EXTRACTION     Patient Active Problem List   Diagnosis Date Noted   Primary hypertension 11/05/2022   Gait abnormality 11/05/2022   Polyarthralgia 11/05/2022   Anxiety state 09/21/2018   Depression 09/21/2018   ADD (attention deficit disorder) 09/21/2018   Ventral hernia 10/12/2015   Endometrial stripe increased 07/28/2015   Ovarian cancer screening 07/19/2015   Ovarian cystic mass - left 07/07/2015   Colon cancer screening    Stenosis of colon ([place])    Diverticulosis of colon without hemorrhage    Pyloric stenosis, acquired    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 ([place]) 12/24/2013    Anemia, normocytic normochromic 12/25/2012   Nausea with vomiting 08/28/2011   Esophageal reflux 08/28/2011   Pyloric channel ulcer 08/28/2011   GERD (gastroesophageal reflux disease) 08/15/2011   Depression, major, recurrent ([place]) 08/15/2011   Anxiety disorder 08/15/2011   Diverticulosis of colon (without mention of hemorrhage) 08/15/2011    PCP: Tawnya Crook  REFERRING PROVIDER: Tawnya Crook  REFERRING DIAG: R shoulder pain, gait instability   THERAPY DIAG:  Other abnormalities of gait and mobility  Muscle weakness (generalized)  Rationale for Evaluation and Treatment: Rehabilitation  ONSET DATE: 3 months: shoulder/  10 years: balance  SUBJECTIVE:   SUBJECTIVE STATEMENT:   Eval:    R shoulder: getting better, over several months. Pain: 7/10 with use, Likes heat. R handed.  Lives w 5 cats and dog. Stairs: 4 to get in , has 2 rails Has cane and walker, does not use either.  Has only had 1 fall, but feels very wobbly  No neuropathy, but does get leg cramps.  No other knee/hip pain, mild low back pain at times.   PERTINENT HISTORY: breast CA with radiation, HTN, Hernia pain/bulge at times.   PAIN:  Are you having pain? Yes: NPRS scale: 7/10 Pain location: R shoulder Pain description: Sore intermittent Aggravating factors: none stated  Relieving factors: heat  PRECAUTIONS: Fall  WEIGHT BEARING RESTRICTIONS: No  FALLS:  Has patient fallen in last 6 months? Yes. Number of falls 1 No injury.    PLOF: Independent  PATIENT GOALS: improve balance, decreased shoulder pain    OBJECTIVE:   DIAGNOSTIC FINDINGS:   PATIENT SURVEYS:   COGNITION: Overall cognitive status: Within functional limits for tasks assessed     SENSATION: WFL  PALPATION: Pain in posterior shoulder, down into deltoid, Soreness in R UT Mild hypomobility of R shoulder   LOWER EXTREMITY ROM:  Hips/knees: WFL Ankles; mild limitation for EV and DF.   Shoulder AROM:  R:  Flexion: 120, abd: 100,    PROM: flex: 140 L: Flexion: 145, abd: 130,   Strength:   Shoulder:  R  flex, abd: 4/5 L  flex, abd: 4/5    LOWER EXTREMITY SPECIAL TESTS:    FUNCTIONAL TESTS:      GAIT: Distance walked: 50 Assistive device utilized: None Level of assistance: Complete Independence Comments: Shakey legs, decreased stability    TODAY'S TREATMENT:  DATE:    12/18/22:  Therapeutic Exercise: Aerobic: Supine:  Shoulder AAROM/Cane x 15, AROM flexion x 10;  flys x 10 (education with all for shoulder positioning );  Seated:  Scap squeeze 2 x 10;  ER with YTB 2x 10;  Standing:  HR at counter x 10;   Stretches:  Neuromuscular Re-education:Standing in front of mat table, L/R weight shifts x 2 min; Staggered stance(modified stance) x 20 bil: with CGA;  Manual Therapy: Prom for R shoulder, flex, IR, ER;  LAD .    PATIENT EDUCATION:  Education details: PT POC, Exam findings, HEP Person educated: Patient Education method: Explanation, Demonstration, Tactile cues, Verbal cues, and Handouts Education comprehension: verbalized understanding, returned demonstration, verbal cues required, tactile cues required, and needs further education  HOME EXERCISE PROGRAM:   ASSESSMENT:  CLINICAL IMPRESSION: Pt very challenged with standing balance, with minimal weight shift. She has much difficulty for narrow BOS. Good tolerance and ability for shoulder exercises today, added to HEP. Will benefit from ongoing balance/stability work.   Eval: Patient presents with primary complaint of increased pain in R shoulder. She has decreased AROM and increased pain with elevation, as well as decreased strength. She also has poor balance, with decreased scoring on balance test today. She is very shakey in thighs, knees and lower legs, which also effects balance. Pt with  decreased ability for full functional activities due to pain and deficit, and will benefit from skilled PT to improve.   OBJECTIVE IMPAIRMENTS: Abnormal gait, decreased activity tolerance, decreased balance, decreased coordination, decreased endurance, decreased knowledge of use of DME, decreased mobility, difficulty walking, decreased ROM, decreased strength, increased muscle spasms, and pain.   ACTIVITY LIMITATIONS: carrying, lifting, standing, stairs, transfers, dressing, reach over head, hygiene/grooming, and locomotion level  PARTICIPATION LIMITATIONS: meal prep, cleaning, laundry, shopping, and community activity  PERSONAL FACTORS: Time since onset of injury/illness/exacerbation are also affecting patient's functional outcome.   REHAB POTENTIAL: Good  CLINICAL DECISION MAKING: Stable/uncomplicated  EVALUATION COMPLEXITY: Low   GOALS: Goals reviewed with patient? Yes  SHORT TERM GOALS: Target date: 12/25/22  Pt to be independent with initial HEP  Goal status: INITIAL  2.  Pt to hold rhomberg stance for at least 20 sec.   Goal status: INITIAL  3.  Pt to perform sit to stand without use of UE from regular chair height   Goal status: INITIAL   LONG TERM GOALS: Target date: 02/05/23  Pt to be independent with final HEP  Goal status: INITIAL  2.  Pt to report decreased pain in R shoulder to -3/10 with activity and elevation for improved ADLs.   Goal status: INITIAL  3.  Pt to demo improved score on BERG by at least 6 points.  :  Goal status: INITIAL  4.  Pt to demo improved static and dynamic balance to be [facility] for pt age. , to improve safety with community activity.   Goal status: INITIAL    PLAN:  PT FREQUENCY: 1-2x/week  PT DURATION: 8 weeks  PLANNED INTERVENTIONS: Therapeutic exercises, Therapeutic activity, Neuromuscular re-education, Balance training, Gait training, Patient/Family education, Self Care, Joint mobilization, Joint manipulation,  Orthotic/Fit training, DME instructions, Aquatic Therapy, Dry Needling, Electrical stimulation, Spinal manipulation, Spinal mobilization, Cryotherapy, Moist heat, Taping, Traction, Ultrasound, Ionotophoresis '4mg'$ /ml Dexamethasone, and Manual therapy  PLAN FOR NEXT SESSION: static balance, rhomberg stance, sit to stands, shoulder HEP   Lyndee Hensen, PT, DPT 12:17 PM  12/18/22

## 2022-12-20 ENCOUNTER — Ambulatory Visit: Payer: Medicare PPO | Admitting: Physical Therapy

## 2022-12-20 ENCOUNTER — Encounter: Payer: Self-pay | Admitting: Physical Therapy

## 2022-12-20 DIAGNOSIS — M6281 Muscle weakness (generalized): Secondary | ICD-10-CM | POA: Diagnosis not present

## 2022-12-20 DIAGNOSIS — R2689 Other abnormalities of gait and mobility: Secondary | ICD-10-CM | POA: Diagnosis not present

## 2022-12-20 NOTE — Therapy (Signed)
OUTPATIENT PHYSICAL THERAPY TREATMENT   Patient Name: Gloria Fields MRN: [id number] DOB:04-13-1946, 77 y.o., female Today's Date: 12/20/2022  END OF SESSION:  PT End of Session - 12/20/22 0934     Visit Number 3    Number of Visits 16    Date for PT Re-Evaluation 02/05/23    Authorization Type [employer]    PT Start Time 0935    PT Stop Time 0932    PT Time Calculation (min) 40 min    Equipment Utilized During Treatment Gait belt    Activity Tolerance Patient tolerated treatment well    Behavior During Therapy WFL for tasks assessed/performed             Past Medical History:  Diagnosis Date   ADD (attention deficit disorder)    Allergy    Anemia, normocytic normochromic 12/25/2012   Mild, chronic, Hb 11-12 grams at least since 11/2006; screening studies & endoscopies unremarkable   Anxiety    Back pain    Breast cancer ([place]) 2007   Chronic kidney disease    hx of stage 3 kidney disease    Depression    Diverticulosis of colon (without mention of hemorrhage)    Esophageal reflux    H/O dehydration    Headache    PMH   Heart murmur    History of blood transfusion    History of radiation therapy    Hypercholesterolemia    Hypertension    Irritable bowel syndrome    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 ([place]) 12/24/2013   Osteopenia    Ovarian cystic mass 07/07/2015   Personal history of radiation therapy 2008   PONV (postoperative nausea and vomiting)    also had severe itching    Pyloric stenosis    Repeated falls    Shortness of breath dyspnea    walking distances or climbing stairs   Past Surgical History:  Procedure Laterality Date   APPENDECTOMY  2007   perforated appendicitis   BREAST LUMPECTOMY  2007   right   COLONOSCOPY     COLONOSCOPY WITH PROPOFOL N/A 07/07/2015   Procedure: COLONOSCOPY WITH PROPOFOL;  Surgeon: Gatha Mayer, MD;  Location: [facility] ENDOSCOPY;  Service: Endoscopy;  Laterality: N/A;  NEED ULTRA SLIM  COLONOSCOPE PLEASE, possible pyloric dilation   ESOPHAGOGASTRODUODENOSCOPY     ESOPHAGOGASTRODUODENOSCOPY N/A 07/07/2015   Procedure: ESOPHAGOGASTRODUODENOSCOPY (EGD);  Surgeon: Gatha Mayer, MD;  Location: [facility] ENDOSCOPY;  Service: Endoscopy;  Laterality: N/A;  POSSIBLE PYLORIC DILATION   HERNIA REPAIR Left    L and ventral   LAPAROSCOPIC BILATERAL SALPINGO OOPHERECTOMY Bilateral 10/12/2015   Procedure: LAPAROSCOPIC  LEFT SALPINGO OOPHORECTOMY AND LYSIS OF ADHESIONS ;  Surgeon: Everitt Amber, MD;  Location: [facility];  Service: Gynecology;  Laterality: Bilateral;   LAPAROSCOPIC LYSIS OF ADHESIONS  10/29/2012   Procedure: LAPAROSCOPIC LYSIS OF ADHESIONS;  Surgeon: Pedro Earls, MD;  Location: [facility];  Service: General;;   UPPER GASTROINTESTINAL ENDOSCOPY  Multiple   UTERINE FIBROID SURGERY     with subsequent scar tissue removal   VENTRAL HERNIA REPAIR  10/29/2012   Procedure: LAPAROSCOPIC VENTRAL HERNIA;  Surgeon: Pedro Earls, MD;  Location: [facility];  Service: General;  Laterality: N/A;  [place], [place] LYSIS OF ADHESIONS    VENTRAL HERNIA REPAIR N/A 10/12/2015   Procedure: LAPAROSCOPIC VENTRAL HERNIA REPAIR ;  Surgeon: Johnathan Hausen, MD;  Location: [facility];  Service: General;  Laterality: N/A;  WISDOM TOOTH EXTRACTION     Patient Active Problem List   Diagnosis Date Noted   Primary hypertension 11/05/2022   Gait abnormality 11/05/2022   Polyarthralgia 11/05/2022   Anxiety state 09/21/2018   Depression 09/21/2018   ADD (attention deficit disorder) 09/21/2018   Ventral hernia 10/12/2015   Endometrial stripe increased 07/28/2015   Ovarian cancer screening 07/19/2015   Ovarian cystic mass - left 07/07/2015   Colon cancer screening    Stenosis of colon ([place])    Diverticulosis of colon without hemorrhage    Pyloric stenosis, acquired    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 ([place]) 12/24/2013   Anemia, normocytic  normochromic 12/25/2012   Nausea with vomiting 08/28/2011   Esophageal reflux 08/28/2011   Pyloric channel ulcer 08/28/2011   GERD (gastroesophageal reflux disease) 08/15/2011   Depression, major, recurrent ([place]) 08/15/2011   Anxiety disorder 08/15/2011   Diverticulosis of colon (without mention of hemorrhage) 08/15/2011    PCP: Tawnya Crook  REFERRING PROVIDER: Tawnya Crook  REFERRING DIAG: R shoulder pain, gait instability   THERAPY DIAG:  Other abnormalities of gait and mobility  Muscle weakness (generalized)  Rationale for Evaluation and Treatment: Rehabilitation  ONSET DATE: 3 months: shoulder/  10 years: balance  SUBJECTIVE:   SUBJECTIVE STATEMENT: 12/20/2022 Pt states shoulder not too sore. Has been doing HEP.   Eval:  R shoulder: getting better, over several months. Pain: 7/10 with use, Likes heat. R handed.  Lives w 5 cats and dog. Stairs: 4 to get in , has 2 rails Has cane and walker, does not use either.  Has only had 1 fall, but feels very wobbly  No neuropathy, but does get leg cramps.  No other knee/hip pain, mild low back pain at times.   PERTINENT HISTORY: breast CA with radiation, HTN, Hernia pain/bulge at times.   PAIN:  Are you having pain? Yes: NPRS scale: 7/10 Pain location: R shoulder Pain description: Sore intermittent Aggravating factors: none stated  Relieving factors: heat  PRECAUTIONS: Fall  WEIGHT BEARING RESTRICTIONS: No  FALLS:  Has patient fallen in last 6 months? Yes. Number of falls 1 No injury.    PLOF: Independent  PATIENT GOALS: improve balance, decreased shoulder pain    OBJECTIVE:   DIAGNOSTIC FINDINGS:   PATIENT SURVEYS:   COGNITION: Overall cognitive status: Within functional limits for tasks assessed     SENSATION: WFL  PALPATION: Pain in posterior shoulder, down into deltoid, Soreness in R UT Mild hypomobility of R shoulder   LOWER EXTREMITY ROM:  Hips/knees: WFL Ankles; mild limitation for  EV and DF.   Shoulder AROM:  R: Flexion: 120, abd: 100,    PROM: flex: 140 L: Flexion: 145, abd: 130,   Strength:   Shoulder:  R  flex, abd: 4/5 L  flex, abd: 4/5    LOWER EXTREMITY SPECIAL TESTS:    FUNCTIONAL TESTS:    GAIT: Distance walked: 50 Assistive device utilized: None Level of assistance: Complete Independence Comments: Shakey legs, decreased stability    TODAY'S TREATMENT:  DATE:   12/20/22: Therapeutic Exercise: Aerobic: Supine:  Shoulder AAROM/Cane x 15,  ER butterfly x 15;  Seated:    ER with YTB 2x 10;  Standing:  Shoulder scap squeeze/low row at wall x 15;  At counter: HR and marching x 20;  Stretches:  Gastroc stretch 30 sec x 3 ea at wall (SBA); And on step, 2 hand hold on rails x 3 bil with education for safe performance with HEP.  Neuromuscular Re-education:Standing at counter: L/R and A/P weight shifts x 25 ea;  Staggered stance with fwd reach at varying heights x 10 bil;  Manual Therapy:     12/18/22:  Therapeutic Exercise: Aerobic: Supine:  Shoulder AAROM/Cane x 15, AROM flexion x 10;  flys x 10 (education with all for shoulder positioning );  Seated:  Scap squeeze 2 x 10;  ER with YTB 2x 10;  Standing:  HR at counter x 10;   Stretches:  Neuromuscular Re-education:Standing in front of mat table, L/R weight shifts x 2 min; Staggered stance(modified stance) x 20 bil: with CGA;  Manual Therapy: Prom for R shoulder, flex, IR, ER;  LAD .    PATIENT EDUCATION:  Education details: PT POC, Exam findings, HEP Person educated: Patient Education method: Explanation, Demonstration, Tactile cues, Verbal cues, and Handouts Education comprehension: verbalized understanding, returned demonstration, verbal cues required, tactile cues required, and needs further education  HOME EXERCISE PROGRAM:   ASSESSMENT:  CLINICAL  IMPRESSION: Pt with slightly improved ability for weight shifts today, less shakey overall today. She reports some "shakiness" increasing with anxiety at times.  Discussed home safety as well with need to have clear paths at home and not having obstacles to step up/over. Plan to progress balance as tolerated.   Eval: Patient presents with primary complaint of increased pain in R shoulder. She has decreased AROM and increased pain with elevation, as well as decreased strength. She also has poor balance, with decreased scoring on balance test today. She is very shakey in thighs, knees and lower legs, which also effects balance. Pt with decreased ability for full functional activities due to pain and deficit, and will benefit from skilled PT to improve.   OBJECTIVE IMPAIRMENTS: Abnormal gait, decreased activity tolerance, decreased balance, decreased coordination, decreased endurance, decreased knowledge of use of DME, decreased mobility, difficulty walking, decreased ROM, decreased strength, increased muscle spasms, and pain.   ACTIVITY LIMITATIONS: carrying, lifting, standing, stairs, transfers, dressing, reach over head, hygiene/grooming, and locomotion level  PARTICIPATION LIMITATIONS: meal prep, cleaning, laundry, shopping, and community activity  PERSONAL FACTORS: Time since onset of injury/illness/exacerbation are also affecting patient's functional outcome.   REHAB POTENTIAL: Good  CLINICAL DECISION MAKING: Stable/uncomplicated  EVALUATION COMPLEXITY: Low   GOALS: Goals reviewed with patient? Yes  SHORT TERM GOALS: Target date: 12/25/22  Pt to be independent with initial HEP  Goal status: INITIAL  2.  Pt to hold rhomberg stance for at least 20 sec.   Goal status: INITIAL  3.  Pt to perform sit to stand without use of UE from regular chair height   Goal status: INITIAL   LONG TERM GOALS: Target date: 02/05/23  Pt to be independent with final HEP  Goal status: INITIAL  2.   Pt to report decreased pain in R shoulder to -3/10 with activity and elevation for improved ADLs.   Goal status: INITIAL  3.  Pt to demo improved score on BERG by at least 6 points.  :  Goal status: INITIAL  4.  Pt to demo improved static and dynamic balance to be [facility] for pt age. , to improve safety with community activity.   Goal status: INITIAL    PLAN:  PT FREQUENCY: 1-2x/week  PT DURATION: 8 weeks  PLANNED INTERVENTIONS: Therapeutic exercises, Therapeutic activity, Neuromuscular re-education, Balance training, Gait training, Patient/Family education, Self Care, Joint mobilization, Joint manipulation, Orthotic/Fit training, DME instructions, Aquatic Therapy, Dry Needling, Electrical stimulation, Spinal manipulation, Spinal mobilization, Cryotherapy, Moist heat, Taping, Traction, Ultrasound, Ionotophoresis '4mg'$ /ml Dexamethasone, and Manual therapy  PLAN FOR NEXT SESSION: static balance, rhomberg stance, sit to stands, shoulder HEP   Lyndee Hensen, PT, DPT 9:39 AM  12/20/22

## 2022-12-23 ENCOUNTER — Encounter: Payer: Medicare PPO | Admitting: Physical Therapy

## 2022-12-25 ENCOUNTER — Encounter: Payer: Self-pay | Admitting: Physical Therapy

## 2022-12-25 ENCOUNTER — Ambulatory Visit: Payer: Medicare PPO | Admitting: Physical Therapy

## 2022-12-25 DIAGNOSIS — M6281 Muscle weakness (generalized): Secondary | ICD-10-CM

## 2022-12-25 DIAGNOSIS — R2689 Other abnormalities of gait and mobility: Secondary | ICD-10-CM

## 2022-12-25 NOTE — Therapy (Signed)
OUTPATIENT PHYSICAL THERAPY TREATMENT   Patient Name: Gloria Fields MRN: [id number] DOB:[birth year], 77 y.o., female Today's Date: 12/25/2022  END OF SESSION:  PT End of Session - 12/25/22 1321     Visit Number 4    Number of Visits 16    Date for PT Re-Evaluation 02/05/23    Authorization Type [employer]    PT Start Time 1319    PT Stop Time 1400    PT Time Calculation (min) 41 min    Equipment Utilized During Treatment Gait belt    Activity Tolerance Patient tolerated treatment well    Behavior During Therapy WFL for tasks assessed/performed             Past Medical History:  Diagnosis Date   ADD (attention deficit disorder)    Allergy    Anemia, normocytic normochromic 12/25/2012   Mild, chronic, Hb 11-12 grams at least since 11/2006; screening studies & endoscopies unremarkable   Anxiety    Back pain    Breast cancer ([place]) 2007   Chronic kidney disease    hx of stage 3 kidney disease    Depression    Diverticulosis of colon (without mention of hemorrhage)    Esophageal reflux    H/O dehydration    Headache    PMH   Heart murmur    History of blood transfusion    History of radiation therapy    Hypercholesterolemia    Hypertension    Irritable bowel syndrome    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 ([place]) 12/24/2013   Osteopenia    Ovarian cystic mass 07/07/2015   Personal history of radiation therapy 2008   PONV (postoperative nausea and vomiting)    also had severe itching    Pyloric stenosis    Repeated falls    Shortness of breath dyspnea    walking distances or climbing stairs   Past Surgical History:  Procedure Laterality Date   APPENDECTOMY  2007   perforated appendicitis   BREAST LUMPECTOMY  2007   right   COLONOSCOPY     COLONOSCOPY WITH PROPOFOL N/A 07/07/2015   Procedure: COLONOSCOPY WITH PROPOFOL;  Surgeon: Gatha Mayer, MD;  Location: [facility] ENDOSCOPY;  Service: Endoscopy;  Laterality: N/A;  NEED ULTRA SLIM  COLONOSCOPE PLEASE, possible pyloric dilation   ESOPHAGOGASTRODUODENOSCOPY     ESOPHAGOGASTRODUODENOSCOPY N/A 07/07/2015   Procedure: ESOPHAGOGASTRODUODENOSCOPY (EGD);  Surgeon: Gatha Mayer, MD;  Location: [facility] ENDOSCOPY;  Service: Endoscopy;  Laterality: N/A;  POSSIBLE PYLORIC DILATION   HERNIA REPAIR Left    L and ventral   LAPAROSCOPIC BILATERAL SALPINGO OOPHERECTOMY Bilateral 10/12/2015   Procedure: LAPAROSCOPIC  LEFT SALPINGO OOPHORECTOMY AND LYSIS OF ADHESIONS ;  Surgeon: Everitt Amber, MD;  Location: [facility] ORS;  Service: Gynecology;  Laterality: Bilateral;   LAPAROSCOPIC LYSIS OF ADHESIONS  10/29/2012   Procedure: LAPAROSCOPIC LYSIS OF ADHESIONS;  Surgeon: Pedro Earls, MD;  Location: [facility] ORS;  Service: General;;   UPPER GASTROINTESTINAL ENDOSCOPY  Multiple   UTERINE FIBROID SURGERY     with subsequent scar tissue removal   VENTRAL HERNIA REPAIR  10/29/2012   Procedure: LAPAROSCOPIC VENTRAL HERNIA;  Surgeon: Pedro Earls, MD;  Location: [facility] ORS;  Service: General;  Laterality: N/A;  [place], [place] LYSIS OF ADHESIONS    VENTRAL HERNIA REPAIR N/A 10/12/2015   Procedure: LAPAROSCOPIC VENTRAL HERNIA REPAIR ;  Surgeon: Johnathan Hausen, MD;  Location: [facility]S;  Service: General;  Laterality: N/A;  WISDOM TOOTH EXTRACTION     Patient Active Problem List   Diagnosis Date Noted   Primary hypertension 11/05/2022   Gait abnormality 11/05/2022   Polyarthralgia 11/05/2022   Anxiety state 09/21/2018   Depression 09/21/2018   ADD (attention deficit disorder) 09/21/2018   Ventral hernia 10/12/2015   Endometrial stripe increased 07/28/2015   Ovarian cancer screening 07/19/2015   Ovarian cystic mass - left 07/07/2015   Colon cancer screening    Stenosis of colon ([place])    Diverticulosis of colon without hemorrhage    Pyloric stenosis, acquired    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 ([place]) 12/24/2013   Anemia, normocytic  normochromic 12/25/2012   Nausea with vomiting 08/28/2011   Esophageal reflux 08/28/2011   Pyloric channel ulcer 08/28/2011   GERD (gastroesophageal reflux disease) 08/15/2011   Depression, major, recurrent ([place]) 08/15/2011   Anxiety disorder 08/15/2011   Diverticulosis of colon (without mention of hemorrhage) 08/15/2011    PCP: Tawnya Crook  REFERRING PROVIDER: Tawnya Crook  REFERRING DIAG: R shoulder pain, gait instability   THERAPY DIAG:  Other abnormalities of gait and mobility  Muscle weakness (generalized)  Rationale for Evaluation and Treatment: Rehabilitation  ONSET DATE: 3 months: shoulder/  10 years: balance  SUBJECTIVE:   SUBJECTIVE STATEMENT: 12/25/2022 Pt states shoulder was sore last couple days, did have some tingling into fingers, thinks maybe she slept wrong, better today.   Eval:  R shoulder: getting better, over several months. Pain: 7/10 with use, Likes heat. R handed.  Lives w 5 cats and dog. Stairs: 4 to get in , has 2 rails Has cane and walker, does not use either.  Has only had 1 fall, but feels very wobbly  No neuropathy, but does get leg cramps.  No other knee/hip pain, mild low back pain at times.   PERTINENT HISTORY: breast CA with radiation, HTN, Hernia pain/bulge at times.   PAIN:  Are you having pain? Yes: NPRS scale: 7/10 Pain location: R shoulder Pain description: Sore intermittent Aggravating factors: none stated  Relieving factors: heat  PRECAUTIONS: Fall  WEIGHT BEARING RESTRICTIONS: No  FALLS:  Has patient fallen in last 6 months? Yes. Number of falls 1 No injury.    PLOF: Independent  PATIENT GOALS: improve balance, decreased shoulder pain    OBJECTIVE:   DIAGNOSTIC FINDINGS:   PATIENT SURVEYS:   COGNITION: Overall cognitive status: Within functional limits for tasks assessed     SENSATION: WFL  PALPATION: Pain in posterior shoulder, down into deltoid, Soreness in R UT Mild hypomobility of R  shoulder   LOWER EXTREMITY ROM:  Hips/knees: WFL Ankles; mild limitation for EV and DF.   Shoulder AROM:  R: Flexion: 120, abd: 100,    PROM: flex: 140 L: Flexion: 145, abd: 130,   Strength:   Shoulder:  R  flex, abd: 4/5 L  flex, abd: 4/5    LOWER EXTREMITY SPECIAL TESTS:    FUNCTIONAL TESTS:    GAIT: Distance walked: 50 Assistive device utilized: None Level of assistance: Complete Independence Comments: Shakey legs, decreased stability    TODAY'S TREATMENT:  DATE:   12/25/22: Therapeutic Exercise: Aerobic: Supine:   Seated:  shoulder rolls x 10;   ER with YTB 2x 10; sit to stand with education on set up and mechanics, higher mat table 3 x 5;  Standing:  Shoulder scap squeeze/low row at wall x 15;   At counter:  HR, marching , hip abd x 20;  Stretches:   Neuromuscular Re-education:Standing at counter: L/R and A/P weight shifts x 25 ea;  Walking , education on cane placement for improved sequencing and efficiency.  Manual Therapy:     12/20/22: Therapeutic Exercise: Aerobic: Supine:  Shoulder AAROM/Cane x 15,  ER butterfly x 15;  Seated:    ER with YTB 2x 10;  Standing:  Shoulder scap squeeze/low row at wall x 15;  At counter: HR and marching x 20;  Stretches:  Gastroc stretch 30 sec x 3 ea at wall (SBA); And on step, 2 hand hold on rails x 3 bil with education for safe performance with HEP.  Neuromuscular Re-education:Standing at counter: L/R and A/P weight shifts x 25 ea;  Staggered stance with fwd reach at varying heights x 10 bil;  Manual Therapy:     12/18/22:  Therapeutic Exercise: Aerobic: Supine:  Shoulder AAROM/Cane x 15, AROM flexion x 10;  flys x 10 (education with all for shoulder positioning );  Seated:  Scap squeeze 2 x 10;  ER with YTB 2x 10;  Standing:  HR at counter x 10;   Stretches:  Neuromuscular  Re-education:Standing in front of mat table, L/R weight shifts x 2 min; Staggered stance(modified stance) x 20 bil: with CGA;  Manual Therapy: Prom for R shoulder, flex, IR, ER;  LAD .    PATIENT EDUCATION:  Education details: PT POC, Exam findings, HEP Person educated: Patient Education method: Explanation, Demonstration, Tactile cues, Verbal cues, and Handouts Education comprehension: verbalized understanding, returned demonstration, verbal cues required, tactile cues required, and needs further education  HOME EXERCISE PROGRAM: Access Code: [employer] URL: [email or web address] Date: 12/25/2022 Prepared by: Lyndee Hensen  Exercises - Seated Knee Extension AROM  - 1 x daily - 2 sets - 10 reps - Heel Raises with Counter Support  - 1 x daily - 2 sets - 10 reps - Standing March with Counter Support  - 1 x daily - 2 sets - 10 reps - Side to Side Weight Shift with Unilateral Counter Support  - 1 x daily - 2 sets - 10 reps - Seated Bilateral Shoulder External Rotation with Resistance  - 1 x daily - 1-2 sets - 10 reps - Supine Shoulder Flexion Extension AAROM with Dowel  - 1 x daily - 1 sets - 10 reps   ASSESSMENT:  CLINICAL IMPRESSION: Updated HEP today. Pt given some standing exercises that she is safe to perform at home, not safe to perform other balance exercises yet. She is very challenged with leg stability and endurance to sustain standing activity.  Plan to work on standing weight shifts and reaching outside of BOS next visit.   Eval: Patient presents with primary complaint of increased pain in R shoulder. She has decreased AROM and increased pain with elevation, as well as decreased strength. She also has poor balance, with decreased scoring on balance test today. She is very shakey in thighs, knees and lower legs, which also effects balance. Pt with decreased ability for full functional activities due to pain and deficit, and will benefit from skilled PT to improve.    OBJECTIVE IMPAIRMENTS: Abnormal gait, decreased  activity tolerance, decreased balance, decreased coordination, decreased endurance, decreased knowledge of use of DME, decreased mobility, difficulty walking, decreased ROM, decreased strength, increased muscle spasms, and pain.   ACTIVITY LIMITATIONS: carrying, lifting, standing, stairs, transfers, dressing, reach over head, hygiene/grooming, and locomotion level  PARTICIPATION LIMITATIONS: meal prep, cleaning, laundry, shopping, and community activity  PERSONAL FACTORS: Time since onset of injury/illness/exacerbation are also affecting patient's functional outcome.   REHAB POTENTIAL: Good  CLINICAL DECISION MAKING: Stable/uncomplicated  EVALUATION COMPLEXITY: Low   GOALS: Goals reviewed with patient? Yes  SHORT TERM GOALS: Target date: 12/25/22  Pt to be independent with initial HEP  Goal status: INITIAL  2.  Pt to hold rhomberg stance for at least 20 sec.   Goal status: INITIAL  3.  Pt to perform sit to stand without use of UE from regular chair height   Goal status: INITIAL   LONG TERM GOALS: Target date: 02/05/23  Pt to be independent with final HEP  Goal status: INITIAL  2.  Pt to report decreased pain in R shoulder to -3/10 with activity and elevation for improved ADLs.   Goal status: INITIAL  3.  Pt to demo improved score on BERG by at least 6 points.  :  Goal status: INITIAL  4.  Pt to demo improved static and dynamic balance to be [facility] for pt age. , to improve safety with community activity.   Goal status: INITIAL    PLAN:  PT FREQUENCY: 1-2x/week  PT DURATION: 8 weeks  PLANNED INTERVENTIONS: Therapeutic exercises, Therapeutic activity, Neuromuscular re-education, Balance training, Gait training, Patient/Family education, Self Care, Joint mobilization, Joint manipulation, Orthotic/Fit training, DME instructions, Aquatic Therapy, Dry Needling, Electrical stimulation, Spinal manipulation, Spinal  mobilization, Cryotherapy, Moist heat, Taping, Traction, Ultrasound, Ionotophoresis '4mg'$ /ml Dexamethasone, and Manual therapy  PLAN FOR NEXT SESSION: static balance, rhomberg stance, sit to stands, shoulder HEP   Lyndee Hensen, PT, DPT 2:15 PM  12/25/22

## 2022-12-30 ENCOUNTER — Ambulatory Visit: Payer: Medicare PPO | Admitting: Physical Therapy

## 2022-12-30 DIAGNOSIS — R2689 Other abnormalities of gait and mobility: Secondary | ICD-10-CM | POA: Diagnosis not present

## 2022-12-30 NOTE — Therapy (Signed)
OUTPATIENT PHYSICAL THERAPY TREATMENT   Patient Name: Gloria Fields MRN: [id number] DOB:1946/06/06, 77 y.o., female Today's Date: 01/01/2023  END OF SESSION:  PT End of Session - 01/01/23 1019     Visit Number 5    Number of Visits 16    Date for PT Re-Evaluation 02/05/23    Authorization Type [employer]    PT Start Time 1225    PT Stop Time 1300    PT Time Calculation (min) 35 min    Equipment Utilized During Treatment Gait belt    Activity Tolerance Patient tolerated treatment well    Behavior During Therapy WFL for tasks assessed/performed              Past Medical History:  Diagnosis Date   ADD (attention deficit disorder)    Allergy    Anemia, normocytic normochromic 12/25/2012   Mild, chronic, Hb 11-12 grams at least since 11/2006; screening studies & endoscopies unremarkable   Anxiety    Back pain    Breast cancer ([place]) 2007   Chronic kidney disease    hx of stage 3 kidney disease    Depression    Diverticulosis of colon (without mention of hemorrhage)    Esophageal reflux    H/O dehydration    Headache    PMH   Heart murmur    History of blood transfusion    History of radiation therapy    Hypercholesterolemia    Hypertension    Irritable bowel syndrome    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 ([place]) 12/24/2013   Osteopenia    Ovarian cystic mass 07/07/2015   Personal history of radiation therapy 2008   PONV (postoperative nausea and vomiting)    also had severe itching    Pyloric stenosis    Repeated falls    Shortness of breath dyspnea    walking distances or climbing stairs   Past Surgical History:  Procedure Laterality Date   APPENDECTOMY  2007   perforated appendicitis   BREAST LUMPECTOMY  2007   right   COLONOSCOPY     COLONOSCOPY WITH PROPOFOL N/A 07/07/2015   Procedure: COLONOSCOPY WITH PROPOFOL;  Surgeon: Gatha Mayer, MD;  Location: [facility] ENDOSCOPY;  Service: Endoscopy;  Laterality: N/A;  NEED ULTRA SLIM  COLONOSCOPE PLEASE, possible pyloric dilation   ESOPHAGOGASTRODUODENOSCOPY     ESOPHAGOGASTRODUODENOSCOPY N/A 07/07/2015   Procedure: ESOPHAGOGASTRODUODENOSCOPY (EGD);  Surgeon: Gatha Mayer, MD;  Location[facility] ENDOSCOPY;  Service: Endoscopy;  Laterality: N/A;  POSSIBLE PYLORIC DILATION   HERNIA REPAIR Left    L and ventral   LAPAROSCOPIC BILATERAL SALPINGO OOPHERECTOMY Bilateral 10/12/2015   Procedure: LAPAROSCOPIC  LEFT SALPINGO OOPHORECTOMY AND LYSIS OF ADHESIONS ;  Surgeon: Everitt Amber, MD;  Location: [facility];  Service: Gynecology;  Laterality: Bilateral;   LAPAROSCOPIC LYSIS OF ADHESIONS  10/29/2012   Procedure: LAPAROSCOPIC LYSIS OF ADHESIONS;  Surgeon: Pedro Earls, MD;  Location: [facility] ORS;  Service: General;;   UPPER GASTROINTESTINAL ENDOSCOPY  Multiple   UTERINE FIBROID SURGERY     with subsequent scar tissue removal   VENTRAL HERNIA REPAIR  10/29/2012   Procedure: LAPAROSCOPIC VENTRAL HERNIA;  Surgeon: Pedro Earls, MD;  Location: [facility];  Service: General;  Laterality: N/A;  [place], [place] LYSIS OF ADHESIONS    VENTRAL HERNIA REPAIR N/A 10/12/2015   Procedure: LAPAROSCOPIC VENTRAL HERNIA REPAIR ;  Surgeon: Johnathan Hausen, MD;  Location: [facility];  Service: General;  Laterality: N/A;  WISDOM TOOTH EXTRACTION     Patient Active Problem List   Diagnosis Date Noted   Primary hypertension 11/05/2022   Gait abnormality 11/05/2022   Polyarthralgia 11/05/2022   Anxiety state 09/21/2018   Depression 09/21/2018   ADD (attention deficit disorder) 09/21/2018   Ventral hernia 10/12/2015   Endometrial stripe increased 07/28/2015   Ovarian cancer screening 07/19/2015   Ovarian cystic mass - left 07/07/2015   Colon cancer screening    Stenosis of colon ([place])    Diverticulosis of colon without hemorrhage    Pyloric stenosis, acquired    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 ([place]) 12/24/2013   Anemia, normocytic  normochromic 12/25/2012   Nausea with vomiting 08/28/2011   Esophageal reflux 08/28/2011   Pyloric channel ulcer 08/28/2011   GERD (gastroesophageal reflux disease) 08/15/2011   Depression, major, recurrent ([place]) 08/15/2011   Anxiety disorder 08/15/2011   Diverticulosis of colon (without mention of hemorrhage) 08/15/2011    PCP: Tawnya Crook  REFERRING PROVIDER: Tawnya Crook  REFERRING DIAG: R shoulder pain, gait instability   THERAPY DIAG:  Other abnormalities of gait and mobility  Rationale for Evaluation and Treatment: Rehabilitation  ONSET DATE: 3 months: shoulder/  10 years: balance  SUBJECTIVE:   SUBJECTIVE STATEMENT: 12/30/2022 Pt  10 min late to appt. Pt with no new complaints.   Eval:  R shoulder: getting better, over several months. Pain: 7/10 with use, Likes heat. R handed.  Lives w 5 cats and dog. Stairs: 4 to get in , has 2 rails Has cane and walker, does not use either.  Has only had 1 fall, but feels very wobbly  No neuropathy, but does get leg cramps.  No other knee/hip pain, mild low back pain at times.   PERTINENT HISTORY: breast CA with radiation, HTN, Hernia pain/bulge at times.   PAIN:  Are you having pain? Yes: NPRS scale: 7/10 Pain location: R shoulder Pain description: Sore intermittent Aggravating factors: none stated  Relieving factors: heat  PRECAUTIONS: Fall  WEIGHT BEARING RESTRICTIONS: No  FALLS:  Has patient fallen in last 6 months? Yes. Number of falls 1 No injury.    PLOF: Independent  PATIENT GOALS: improve balance, decreased shoulder pain    OBJECTIVE:   DIAGNOSTIC FINDINGS:   PATIENT SURVEYS:   COGNITION: Overall cognitive status: Within functional limits for tasks assessed     SENSATION: WFL  PALPATION: Pain in posterior shoulder, down into deltoid, Soreness in R UT Mild hypomobility of R shoulder   LOWER EXTREMITY ROM:  Hips/knees: WFL Ankles; mild limitation for EV and DF.   Shoulder AROM:  R:  Flexion: 120, abd: 100,    PROM: flex: 140 L: Flexion: 145, abd: 130,   Strength:   Shoulder:  R  flex, abd: 4/5 L  flex, abd: 4/5    LOWER EXTREMITY SPECIAL TESTS:    FUNCTIONAL TESTS:    GAIT: Distance walked: 50 Assistive device utilized: None Level of assistance: Complete Independence Comments: Shakey legs, decreased stability    TODAY'S TREATMENT:  DATE:   12/30/22: Therapeutic Exercise: Aerobic: Supine:   Seated: sit to stand - reg height mat table  x 12;  Standing:  Shoulder scap squeeze/low row x 15;   At counter: HR, marching, hip abd x 20; reviewed Gastroc stretch at counter for HEP x 2 bil;  Stretches:   Neuromuscular Re-education: reaching outside of BOS 2 x 5 bil;  Torso turns x 10;  UE flexion x 10 bil;  Manual Therapy:  Gait training:  Education and practice for walking with SPC with optimal sequencing and distance with cane. 45 ft  x 8.    12/25/22: Therapeutic Exercise: Aerobic: Supine:   Seated:  shoulder rolls x 10;   ER with YTB 2x 10; sit to stand with education on set up and mechanics, higher mat table 3 x 5;  Standing:  Shoulder scap squeeze/low row at wall x 15;   At counter:  HR, marching , hip abd x 20;  Stretches:   Neuromuscular Re-education:Standing at counter: L/R and A/P weight shifts x 25 ea;  Walking , education on cane placement for improved sequencing and efficiency.  Manual Therapy:     12/20/22: Therapeutic Exercise: Aerobic: Supine:  Shoulder AAROM/Cane x 15,  ER butterfly x 15;  Seated:    ER with YTB 2x 10;  Standing:  Shoulder scap squeeze/low row at wall x 15;  At counter: HR and marching x 20;  Stretches:  Gastroc stretch 30 sec x 3 ea at wall (SBA); And on step, 2 hand hold on rails x 3 bil with education for safe performance with HEP.  Neuromuscular Re-education:Standing at counter: L/R and A/P  weight shifts x 25 ea;  Staggered stance with fwd reach at varying heights x 10 bil;  Manual Therapy:     12/18/22:  Therapeutic Exercise: Aerobic: Supine:  Shoulder AAROM/Cane x 15, AROM flexion x 10;  flys x 10 (education with all for shoulder positioning );  Seated:  Scap squeeze 2 x 10;  ER with YTB 2x 10;  Standing:  HR at counter x 10;   Stretches:  Neuromuscular Re-education:Standing in front of mat table, L/R weight shifts x 2 min; Staggered stance(modified stance) x 20 bil: with CGA;  Manual Therapy: Prom for R shoulder, flex, IR, ER;  LAD .    PATIENT EDUCATION:  Education details: PT POC, Exam findings, HEP Person educated: Patient Education method: Explanation, Demonstration, Tactile cues, Verbal cues, and Handouts Education comprehension: verbalized understanding, returned demonstration, verbal cues required, tactile cues required, and needs further education  HOME EXERCISE PROGRAM: Access Code: [facility] URL: [email or web address] Date: 12/25/2022 Prepared by: Lyndee Hensen  Exercises - Seated Knee Extension AROM  - 1 x daily - 2 sets - 10 reps - Heel Raises with Counter Support  - 1 x daily - 2 sets - 10 reps - Standing March with Counter Support  - 1 x daily - 2 sets - 10 reps - Side to Side Weight Shift with Unilateral Counter Support  - 1 x daily - 2 sets - 10 reps - Seated Bilateral Shoulder External Rotation with Resistance  - 1 x daily - 1-2 sets - 10 reps - Supine Shoulder Flexion Extension AAROM with Dowel  - 1 x daily - 1 sets - 10 reps   ASSESSMENT:  CLINICAL IMPRESSION: Pt with improved ability for static standing with reaching and UE movements today, less shakiness in legs. Also improving with sit to stand ease and speed. She is challenged with optimal use of  SPC, but improved by end of session with practice. Will benefit from continued education on safe cane use as well as progressive balance.   Eval: Patient presents with primary  complaint of increased pain in R shoulder. She has decreased AROM and increased pain with elevation, as well as decreased strength. She also has poor balance, with decreased scoring on balance test today. She is very shakey in thighs, knees and lower legs, which also effects balance. Pt with decreased ability for full functional activities due to pain and deficit, and will benefit from skilled PT to improve.   OBJECTIVE IMPAIRMENTS: Abnormal gait, decreased activity tolerance, decreased balance, decreased coordination, decreased endurance, decreased knowledge of use of DME, decreased mobility, difficulty walking, decreased ROM, decreased strength, increased muscle spasms, and pain.   ACTIVITY LIMITATIONS: carrying, lifting, standing, stairs, transfers, dressing, reach over head, hygiene/grooming, and locomotion level  PARTICIPATION LIMITATIONS: meal prep, cleaning, laundry, shopping, and community activity  PERSONAL FACTORS: Time since onset of injury/illness/exacerbation are also affecting patient's functional outcome.   REHAB POTENTIAL: Good  CLINICAL DECISION MAKING: Stable/uncomplicated  EVALUATION COMPLEXITY: Low   GOALS: Goals reviewed with patient? Yes  SHORT TERM GOALS: Target date: 12/25/22  Pt to be independent with initial HEP  Goal status: INITIAL  2.  Pt to hold rhomberg stance for at least 20 sec.   Goal status: INITIAL  3.  Pt to perform sit to stand without use of UE from regular chair height   Goal status: INITIAL   LONG TERM GOALS: Target date: 02/05/23  Pt to be independent with final HEP  Goal status: INITIAL  2.  Pt to report decreased pain in R shoulder to -3/10 with activity and elevation for improved ADLs.   Goal status: INITIAL  3.  Pt to demo improved score on BERG by at least 6 points.  :  Goal status: INITIAL  4.  Pt to demo improved static and dynamic balance to be [facility] for pt age. , to improve safety with community activity.   Goal status:  INITIAL    PLAN:  PT FREQUENCY: 1-2x/week  PT DURATION: 8 weeks  PLANNED INTERVENTIONS: Therapeutic exercises, Therapeutic activity, Neuromuscular re-education, Balance training, Gait training, Patient/Family education, Self Care, Joint mobilization, Joint manipulation, Orthotic/Fit training, DME instructions, Aquatic Therapy, Dry Needling, Electrical stimulation, Spinal manipulation, Spinal mobilization, Cryotherapy, Moist heat, Taping, Traction, Ultrasound, Ionotophoresis '4mg'$ /ml Dexamethasone, and Manual therapy  PLAN FOR NEXT SESSION: static balance, rhomberg stance, sit to stands, shoulder HEP   Lyndee Hensen, PT, DPT 10:19 AM  01/01/23

## 2023-01-01 ENCOUNTER — Encounter: Payer: Self-pay | Admitting: Physical Therapy

## 2023-01-01 ENCOUNTER — Ambulatory Visit: Payer: Medicare PPO | Admitting: Physical Therapy

## 2023-01-01 DIAGNOSIS — R2689 Other abnormalities of gait and mobility: Secondary | ICD-10-CM

## 2023-01-01 DIAGNOSIS — M6281 Muscle weakness (generalized): Secondary | ICD-10-CM

## 2023-01-01 NOTE — Therapy (Signed)
OUTPATIENT PHYSICAL THERAPY TREATMENT   Patient Name: Gloria Fields MRN: [id number] DOB:1945-12-04, 77 y.o., female Today's Date: 01/01/2023  END OF SESSION:  PT End of Session - 01/01/23 1218     Visit Number 6    Number of Visits 16    Date for PT Re-Evaluation 02/05/23    Authorization Type [employer]    PT Start Time 1218    PT Stop Time 1300    PT Time Calculation (min) 42 min    Equipment Utilized During Treatment Gait belt    Activity Tolerance Patient tolerated treatment well    Behavior During Therapy WFL for tasks assessed/performed              Past Medical History:  Diagnosis Date   ADD (attention deficit disorder)    Allergy    Anemia, normocytic normochromic 12/25/2012   Mild, chronic, Hb 11-12 grams at least since 11/2006; screening studies & endoscopies unremarkable   Anxiety    Back pain    Breast cancer ([place]) 2007   Chronic kidney disease    hx of stage 3 kidney disease    Depression    Diverticulosis of colon (without mention of hemorrhage)    Esophageal reflux    H/O dehydration    Headache    PMH   Heart murmur    History of blood transfusion    History of radiation therapy    Hypercholesterolemia    Hypertension    Irritable bowel syndrome    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 ([place]) 12/24/2013   Osteopenia    Ovarian cystic mass 07/07/2015   Personal history of radiation therapy 2008   PONV (postoperative nausea and vomiting)    also had severe itching    Pyloric stenosis    Repeated falls    Shortness of breath dyspnea    walking distances or climbing stairs   Past Surgical History:  Procedure Laterality Date   APPENDECTOMY  2007   perforated appendicitis   BREAST LUMPECTOMY  2007   right   COLONOSCOPY     COLONOSCOPY WITH PROPOFOL N/A 07/07/2015   Procedure: COLONOSCOPY WITH PROPOFOL;  Surgeon: Gatha Mayer, MD;  Location: [facility] ENDOSCOPY;  Service: Endoscopy;  Laterality: N/A;  NEED ULTRA SLIM  COLONOSCOPE PLEASE, possible pyloric dilation   ESOPHAGOGASTRODUODENOSCOPY     ESOPHAGOGASTRODUODENOSCOPY N/A 07/07/2015   Procedure: ESOPHAGOGASTRODUODENOSCOPY (EGD);  Surgeon: Gatha Mayer, MD;  Location: [facility] ENDOSCOPY;  Service: Endoscopy;  Laterality: N/A;  POSSIBLE PYLORIC DILATION   HERNIA REPAIR Left    L and ventral   LAPAROSCOPIC BILATERAL SALPINGO OOPHERECTOMY Bilateral 10/12/2015   Procedure: LAPAROSCOPIC  LEFT SALPINGO OOPHORECTOMY AND LYSIS OF ADHESIONS ;  Surgeon: Everitt Amber, MD;  Location: [facility] ORS;  Service: Gynecology;  Laterality: Bilateral;   LAPAROSCOPIC LYSIS OF ADHESIONS  10/29/2012   Procedure: LAPAROSCOPIC LYSIS OF ADHESIONS;  Surgeon: Pedro Earls, MD;  Location: [facility] ORS;  Service: General;;   UPPER GASTROINTESTINAL ENDOSCOPY  Multiple   UTERINE FIBROID SURGERY     with subsequent scar tissue removal   VENTRAL HERNIA REPAIR  10/29/2012   Procedure: LAPAROSCOPIC VENTRAL HERNIA;  Surgeon: Pedro Earls, MD;  Location: [facility] ORS;  Service: General;  Laterality: N/A;  [place], [place] LYSIS OF ADHESIONS    VENTRAL HERNIA REPAIR N/A 10/12/2015   Procedure: LAPAROSCOPIC VENTRAL HERNIA REPAIR ;  Surgeon: Johnathan Hausen, MD;  Location: [facility] ORS;  Service: General;  Laterality: N/A;  WISDOM TOOTH EXTRACTION     Patient Active Problem List   Diagnosis Date Noted   Primary hypertension 11/05/2022   Gait abnormality 11/05/2022   Polyarthralgia 11/05/2022   Anxiety state 09/21/2018   Depression 09/21/2018   ADD (attention deficit disorder) 09/21/2018   Ventral hernia 10/12/2015   Endometrial stripe increased 07/28/2015   Ovarian cancer screening 07/19/2015   Ovarian cystic mass - left 07/07/2015   Colon cancer screening    Stenosis of colon ([place])    Diverticulosis of colon without hemorrhage    Pyloric stenosis, acquired    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 ([place]) 12/24/2013   Anemia, normocytic  normochromic 12/25/2012   Nausea with vomiting 08/28/2011   Esophageal reflux 08/28/2011   Pyloric channel ulcer 08/28/2011   GERD (gastroesophageal reflux disease) 08/15/2011   Depression, major, recurrent ([place]) 08/15/2011   Anxiety disorder 08/15/2011   Diverticulosis of colon (without mention of hemorrhage) 08/15/2011    PCP: Tawnya Crook  REFERRING PROVIDER: Tawnya Crook  REFERRING DIAG: R shoulder pain, gait instability   THERAPY DIAG:  Other abnormalities of gait and mobility  Muscle weakness (generalized)  Rationale for Evaluation and Treatment: Rehabilitation  ONSET DATE: 3 months: shoulder/  10 years: balance  SUBJECTIVE:   SUBJECTIVE STATEMENT: 01/01/2023  Pt with no new complaints.   Eval:  R shoulder: getting better, over several months. Pain: 7/10 with use, Likes heat. R handed.  Lives w 5 cats and dog. Stairs: 4 to get in , has 2 rails Has cane and walker, does not use either.  Has only had 1 fall, but feels very wobbly  No neuropathy, but does get leg cramps.  No other knee/hip pain, mild low back pain at times.   PERTINENT HISTORY: breast CA with radiation, HTN, Hernia pain/bulge at times.   PAIN:  Are you having pain? Yes: NPRS scale: 7/10 Pain location: R shoulder Pain description: Sore intermittent Aggravating factors: none stated  Relieving factors: heat  PRECAUTIONS: Fall  WEIGHT BEARING RESTRICTIONS: No  FALLS:  Has patient fallen in last 6 months? Yes. Number of falls 1 No injury.    PLOF: Independent  PATIENT GOALS: improve balance, decreased shoulder pain    OBJECTIVE:   DIAGNOSTIC FINDINGS:   PATIENT SURVEYS:   COGNITION: Overall cognitive status: Within functional limits for tasks assessed     SENSATION: WFL  PALPATION: Pain in posterior shoulder, down into deltoid, Soreness in R UT Mild hypomobility of R shoulder   LOWER EXTREMITY ROM:  Hips/knees: WFL Ankles; mild limitation for EV and DF.   Shoulder  AROM:  R: Flexion: 120, abd: 100,    PROM: flex: 140 L: Flexion: 145, abd: 130,   Strength:   Shoulder:  R  flex, abd: 4/5 L  flex, abd: 4/5    LOWER EXTREMITY SPECIAL TESTS:    FUNCTIONAL TESTS:    GAIT: Distance walked: 50 Assistive device utilized: None Level of assistance: Complete Independence Comments: Shakey legs, decreased stability    TODAY'S TREATMENT:  DATE:   01/01/23: Therapeutic Exercise: Aerobic: Supine:   Seated: sit to stand - reg chair , no UE support  x 10;  Standing:  Shoulder scap squeeze/low row x 15;  Squats at mat table x 10;  Stretches:   Neuromuscular Re-education: reaching outside of BOS 2 x 5 bil;  head turns L/R x 15; UE flexion x 10 bil;  fwd stepping with weight shifts x 10 bi at counter: Modified tandem stance/staggered stance 20 sec x 3 bil; then on 6 in stp 2  x 30 sec bil;   toe taps onto 6 in step x 20;   Manual Therapy:  Gait training:  Education and practice for walking with SPC with optimal sequencing and distance with cane. 45 ft  x 8.    12/30/22: Therapeutic Exercise: Aerobic: Supine:   Seated: sit to stand - reg height mat table  x 12;  Standing:  Shoulder scap squeeze/low row x 15;   At counter: HR, marching, hip abd x 20; reviewed Gastroc stretch at counter for HEP x 2 bil;  Stretches:   Neuromuscular Re-education: reaching outside of BOS 2 x 5 bil;  Torso turns x 10;  UE flexion x 10 bil;  Manual Therapy:  Gait training:  Education and practice for walking with SPC with optimal sequencing and distance with cane. 45 ft  x 8.    12/25/22: Therapeutic Exercise: Aerobic: Supine:   Seated:  shoulder rolls x 10;   ER with YTB 2x 10; sit to stand with education on set up and mechanics, higher mat table 3 x 5;  Standing:  Shoulder scap squeeze/low row at wall x 15;   At counter:  HR, marching , hip abd x  20;  Stretches:   Neuromuscular Re-education:Standing at counter: L/R and A/P weight shifts x 25 ea;  Walking , education on cane placement for improved sequencing and efficiency.  Manual Therapy:     12/20/22: Therapeutic Exercise: Aerobic: Supine:  Shoulder AAROM/Cane x 15,  ER butterfly x 15;  Seated:    ER with YTB 2x 10;  Standing:  Shoulder scap squeeze/low row at wall x 15;  At counter: HR and marching x 20;  Stretches:  Gastroc stretch 30 sec x 3 ea at wall (SBA); And on step, 2 hand hold on rails x 3 bil with education for safe performance with HEP.  Neuromuscular Re-education:Standing at counter: L/R and A/P weight shifts x 25 ea;  Staggered stance with fwd reach at varying heights x 10 bil;  Manual Therapy:     12/18/22:  Therapeutic Exercise: Aerobic: Supine:  Shoulder AAROM/Cane x 15, AROM flexion x 10;  flys x 10 (education with all for shoulder positioning );  Seated:  Scap squeeze 2 x 10;  ER with YTB 2x 10;  Standing:  HR at counter x 10;   Stretches:  Neuromuscular Re-education:Standing in front of mat table, L/R weight shifts x 2 min; Staggered stance(modified stance) x 20 bil: with CGA;  Manual Therapy: Prom for R shoulder, flex, IR, ER;  LAD .    PATIENT EDUCATION:  Education details: PT POC, Exam findings, HEP Person educated: Patient Education method: Explanation, Demonstration, Tactile cues, Verbal cues, and Handouts Education comprehension: verbalized understanding, returned demonstration, verbal cues required, tactile cues required, and needs further education  HOME EXERCISE PROGRAM: Access Code: [facility]    ASSESSMENT:  CLINICAL IMPRESSION: Pt most challenged with toe taps and staggered stance position today. Will continue to practice. Improving ability for  reaching outside of BOS. Legs very shaky today with activities, but does not cause pt to lose balance. She has improving ability for sequencing with SPC, but required mod-max cuing today, only  some carry over from last session. will continue to practice.   Eval: Patient presents with primary complaint of increased pain in R shoulder. She has decreased AROM and increased pain with elevation, as well as decreased strength. She also has poor balance, with decreased scoring on balance test today. She is very shakey in thighs, knees and lower legs, which also effects balance. Pt with decreased ability for full functional activities due to pain and deficit, and will benefit from skilled PT to improve.   OBJECTIVE IMPAIRMENTS: Abnormal gait, decreased activity tolerance, decreased balance, decreased coordination, decreased endurance, decreased knowledge of use of DME, decreased mobility, difficulty walking, decreased ROM, decreased strength, increased muscle spasms, and pain.   ACTIVITY LIMITATIONS: carrying, lifting, standing, stairs, transfers, dressing, reach over head, hygiene/grooming, and locomotion level  PARTICIPATION LIMITATIONS: meal prep, cleaning, laundry, shopping, and community activity  PERSONAL FACTORS: Time since onset of injury/illness/exacerbation are also affecting patient's functional outcome.   REHAB POTENTIAL: Good  CLINICAL DECISION MAKING: Stable/uncomplicated  EVALUATION COMPLEXITY: Low   GOALS: Goals reviewed with patient? Yes  SHORT TERM GOALS: Target date: 12/25/22  Pt to be independent with initial HEP  Goal status: INITIAL  2.  Pt to hold rhomberg stance for at least 20 sec.   Goal status: INITIAL  3.  Pt to perform sit to stand without use of UE from regular chair height   Goal status: INITIAL   LONG TERM GOALS: Target date: 02/05/23  Pt to be independent with final HEP  Goal status: INITIAL  2.  Pt to report decreased pain in R shoulder to -3/10 with activity and elevation for improved ADLs.   Goal status: INITIAL  3.  Pt to demo improved score on BERG by at least 6 points.  :  Goal status: INITIAL  4.  Pt to demo improved static  and dynamic balance to be [facility] for pt age. , to improve safety with community activity.   Goal status: INITIAL    PLAN:  PT FREQUENCY: 1-2x/week  PT DURATION: 8 weeks  PLANNED INTERVENTIONS: Therapeutic exercises, Therapeutic activity, Neuromuscular re-education, Balance training, Gait training, Patient/Family education, Self Care, Joint mobilization, Joint manipulation, Orthotic/Fit training, DME instructions, Aquatic Therapy, Dry Needling, Electrical stimulation, Spinal manipulation, Spinal mobilization, Cryotherapy, Moist heat, Taping, Traction, Ultrasound, Ionotophoresis '4mg'$ /ml Dexamethasone, and Manual therapy  PLAN FOR NEXT SESSION: static balance, rhomberg stance, staggered stance, step ups.   Lyndee Hensen, PT, DPT 12:18 PM  01/01/23

## 2023-01-06 ENCOUNTER — Ambulatory Visit: Payer: Medicare PPO | Admitting: Physical Therapy

## 2023-01-06 ENCOUNTER — Encounter: Payer: Self-pay | Admitting: Physical Therapy

## 2023-01-06 DIAGNOSIS — R2689 Other abnormalities of gait and mobility: Secondary | ICD-10-CM

## 2023-01-06 DIAGNOSIS — M6281 Muscle weakness (generalized): Secondary | ICD-10-CM | POA: Diagnosis not present

## 2023-01-06 NOTE — Therapy (Signed)
OUTPATIENT PHYSICAL THERAPY TREATMENT   Patient Name: Gloria Fields MRN: [id number] DOB:1946-02-26, 77 y.o., female Today's Date: 01/06/2023  END OF SESSION:  PT End of Session - 01/06/23 1217     Visit Number 7    Number of Visits 16    Date for PT Re-Evaluation 02/05/23    Authorization Type [employer]    PT Start Time 1218    PT Stop Time [id number]    PT Time Calculation (min) 38 min    Equipment Utilized During Treatment Gait belt    Activity Tolerance Patient tolerated treatment well    Behavior During Therapy WFL for tasks assessed/performed              Past Medical History:  Diagnosis Date   ADD (attention deficit disorder)    Allergy    Anemia, normocytic normochromic 12/25/2012   Mild, chronic, Hb 11-12 grams at least since 11/2006; screening studies & endoscopies unremarkable   Anxiety    Back pain    Breast cancer ([place]) 2007   Chronic kidney disease    hx of stage 3 kidney disease    Depression    Diverticulosis of colon (without mention of hemorrhage)    Esophageal reflux    H/O dehydration    Headache    PMH   Heart murmur    History of blood transfusion    History of radiation therapy    Hypercholesterolemia    Hypertension    Irritable bowel syndrome    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 ([place]) 12/24/2013   Osteopenia    Ovarian cystic mass 07/07/2015   Personal history of radiation therapy 2008   PONV (postoperative nausea and vomiting)    also had severe itching    Pyloric stenosis    Repeated falls    Shortness of breath dyspnea    walking distances or climbing stairs   Past Surgical History:  Procedure Laterality Date   APPENDECTOMY  2007   perforated appendicitis   BREAST LUMPECTOMY  2007   right   COLONOSCOPY     COLONOSCOPY WITH PROPOFOL N/A 07/07/2015   Procedure: COLONOSCOPY WITH PROPOFOL;  Surgeon: Gatha Mayer, MD;  Location: [facility] ENDOSCOPY;  Service: Endoscopy;  Laterality: N/A;  NEED ULTRA SLIM  COLONOSCOPE PLEASE, possible pyloric dilation   ESOPHAGOGASTRODUODENOSCOPY     ESOPHAGOGASTRODUODENOSCOPY N/A 07/07/2015   Procedure: ESOPHAGOGASTRODUODENOSCOPY (EGD);  Surgeon: Gatha Mayer, MD;  Location: [facility] ENDOSCOPY;  Service: Endoscopy;  Laterality: N/A;  POSSIBLE PYLORIC DILATION   HERNIA REPAIR Left    L and ventral   LAPAROSCOPIC BILATERAL SALPINGO OOPHERECTOMY Bilateral 10/12/2015   Procedure: LAPAROSCOPIC  LEFT SALPINGO OOPHORECTOMY AND LYSIS OF ADHESIONS ;  Surgeon: Everitt Amber, MD;  Location: [facility] ORS;  Service: Gynecology;  Laterality: Bilateral;   LAPAROSCOPIC LYSIS OF ADHESIONS  10/29/2012   Procedure: LAPAROSCOPIC LYSIS OF ADHESIONS;  Surgeon: Pedro Earls, MD;  Location: [facility] ORS;  Service: General;;   UPPER GASTROINTESTINAL ENDOSCOPY  Multiple   UTERINE FIBROID SURGERY     with subsequent scar tissue removal   VENTRAL HERNIA REPAIR  10/29/2012   Procedure: LAPAROSCOPIC VENTRAL HERNIA;  Surgeon: Pedro Earls, MD;  Location: [facility] ORS;  Service: General;  Laterality: N/A;  [place], [place] LYSIS OF ADHESIONS    VENTRAL HERNIA REPAIR N/A 10/12/2015   Procedure: LAPAROSCOPIC VENTRAL HERNIA REPAIR ;  Surgeon: Johnathan Hausen, MD;  Location: [facility] ORS;  Service: General;  Laterality: N/A;  WISDOM TOOTH EXTRACTION     Patient Active Problem List   Diagnosis Date Noted   Primary hypertension 11/05/2022   Gait abnormality 11/05/2022   Polyarthralgia 11/05/2022   Anxiety state 09/21/2018   Depression 09/21/2018   ADD (attention deficit disorder) 09/21/2018   Ventral hernia 10/12/2015   Endometrial stripe increased 07/28/2015   Ovarian cancer screening 07/19/2015   Ovarian cystic mass - left 07/07/2015   Colon cancer screening    Stenosis of colon ([place])    Diverticulosis of colon without hemorrhage    Pyloric stenosis, acquired    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 ([place]) 12/24/2013   Anemia, normocytic  normochromic 12/25/2012   Nausea with vomiting 08/28/2011   Esophageal reflux 08/28/2011   Pyloric channel ulcer 08/28/2011   GERD (gastroesophageal reflux disease) 08/15/2011   Depression, major, recurrent ([place]) 08/15/2011   Anxiety disorder 08/15/2011   Diverticulosis of colon (without mention of hemorrhage) 08/15/2011    PCP: Tawnya Crook  REFERRING PROVIDER: Tawnya Crook  REFERRING DIAG: R shoulder pain, gait instability   THERAPY DIAG:  Other abnormalities of gait and mobility  Muscle weakness (generalized)  Rationale for Evaluation and Treatment: Rehabilitation  ONSET DATE: 3 months: shoulder/  10 years: balance  SUBJECTIVE:   SUBJECTIVE STATEMENT: 01/06/2023  Pt with no new complaints.   Eval:  R shoulder: getting better, over several months. Pain: 7/10 with use, Likes heat. R handed.  Lives w 5 cats and dog. Stairs: 4 to get in , has 2 rails Has cane and walker, does not use either.  Has only had 1 fall, but feels very wobbly  No neuropathy, but does get leg cramps.  No other knee/hip pain, mild low back pain at times.   PERTINENT HISTORY: breast CA with radiation, HTN, Hernia pain/bulge at times.   PAIN:  Are you having pain? Yes: NPRS scale: 7/10 Pain location: R shoulder Pain description: Sore intermittent Aggravating factors: none stated  Relieving factors: heat  PRECAUTIONS: Fall  WEIGHT BEARING RESTRICTIONS: No  FALLS:  Has patient fallen in last 6 months? Yes. Number of falls 1 No injury.    PLOF: Independent  PATIENT GOALS: improve balance, decreased shoulder pain    OBJECTIVE:   DIAGNOSTIC FINDINGS:   PATIENT SURVEYS:   COGNITION: Overall cognitive status: Within functional limits for tasks assessed     SENSATION: WFL  PALPATION: Pain in posterior shoulder, down into deltoid, Soreness in R UT Mild hypomobility of R shoulder   LOWER EXTREMITY ROM:  Hips/knees: WFL Ankles; mild limitation for EV and DF.   Shoulder  AROM:  R: Flexion: 120, abd: 100,    PROM: flex: 140 L: Flexion: 145, abd: 130,   Strength:   Shoulder:  R  flex, abd: 4/5 L  flex, abd: 4/5    LOWER EXTREMITY SPECIAL TESTS:    FUNCTIONAL TESTS:    GAIT: Distance walked: 50 Assistive device utilized: None Level of assistance: Complete Independence Comments: Shakey legs, decreased stability    TODAY'S TREATMENT:  DATE:   01/06/23: Therapeutic Exercise: Aerobic: Supine:  shoulder arom flexion x 15;  Seated:  Shoulder ER YTB 2 x 10;  Standing:   shoulder scaption arom x 10 bil;  Squats at mat table x 15;  Stretches:   Neuromuscular Re-education: L/R and A/P weight shifts x 20;  fwd and lateral stepping with weight shifts x 10ea bil ; Modified tandem stance/staggered stance 20 sec x 2 bil; then on 6 in stp 2  x 30 sec bil;   toe taps onto 6 in step x 20;  Step ups 4 in x 10 bil, 1 UE support;  up/down 5 steps x 4 ,1 UE support;  Manual Therapy:  Gait training:  Education and practice for walking with SPC with optimal sequencing and distance with cane. 45 ft  x 4.   01/01/23: Therapeutic Exercise: Aerobic: Supine:   Seated: sit to stand - reg chair , no UE support  x 10;  Standing:  Shoulder scap squeeze/low row x 15;  Squats at mat table x 10;  Stretches:   Neuromuscular Re-education: reaching outside of BOS 2 x 5 bil;  head turns L/R x 15; UE flexion x 10 bil;  fwd stepping with weight shifts x 10 bi at counter: Modified tandem stance/staggered stance 20 sec x 3 bil; then on 6 in stp 2  x 30 sec bil;   toe taps onto 6 in step x 20;   Manual Therapy:  Gait training:  Education and practice for walking with SPC with optimal sequencing and distance with cane. 45 ft  x 8.    12/30/22: Therapeutic Exercise: Aerobic: Supine:   Seated: sit to stand - reg height mat table  x 12;  Standing:  Shoulder scap  squeeze/low row x 15;   At counter: HR, marching, hip abd x 20; reviewed Gastroc stretch at counter for HEP x 2 bil;  Stretches:   Neuromuscular Re-education: reaching outside of BOS 2 x 5 bil;  Torso turns x 10;  UE flexion x 10 bil;  Manual Therapy:  Gait training:  Education and practice for walking with SPC with optimal sequencing and distance with cane. 45 ft  x 8.    12/25/22: Therapeutic Exercise: Aerobic: Supine:   Seated:  shoulder rolls x 10;   ER with YTB 2x 10; sit to stand with education on set up and mechanics, higher mat table 3 x 5;  Standing:  Shoulder scap squeeze/low row at wall x 15;   At counter:  HR, marching , hip abd x 20;  Stretches:   Neuromuscular Re-education:Standing at counter: L/R and A/P weight shifts x 25 ea;  Walking , education on cane placement for improved sequencing and efficiency.  Manual Therapy:     12/20/22: Therapeutic Exercise: Aerobic: Supine:  Shoulder AAROM/Cane x 15,  ER butterfly x 15;  Seated:    ER with YTB 2x 10;  Standing:  Shoulder scap squeeze/low row at wall x 15;  At counter: HR and marching x 20;  Stretches:  Gastroc stretch 30 sec x 3 ea at wall (SBA); And on step, 2 hand hold on rails x 3 bil with education for safe performance with HEP.  Neuromuscular Re-education:Standing at counter: L/R and A/P weight shifts x 25 ea;  Staggered stance with fwd reach at varying heights x 10 bil;  Manual Therapy:     12/18/22:  Therapeutic Exercise: Aerobic: Supine:  Shoulder AAROM/Cane x 15, AROM flexion x 10;  flys x 10 (education  with all for shoulder positioning );  Seated:  Scap squeeze 2 x 10;  ER with YTB 2x 10;  Standing:  HR at counter x 10;   Stretches:  Neuromuscular Re-education:Standing in front of mat table, L/R weight shifts x 2 min; Staggered stance(modified stance) x 20 bil: with CGA;  Manual Therapy: Prom for R shoulder, flex, IR, ER;  LAD .    PATIENT EDUCATION:  Education details: PT POC, Exam findings,  HEP Person educated: Patient Education method: Explanation, Demonstration, Tactile cues, Verbal cues, and Handouts Education comprehension: verbalized understanding, returned demonstration, verbal cues required, tactile cues required, and needs further education  HOME EXERCISE PROGRAM: Access Code: [facility]    ASSESSMENT:  CLINICAL IMPRESSION: Pt with good tolerance for activities today. She has improved ability for Rhomberg stance from testing at eval. She is very challenged with tandem stance, will benefit from continued practice with this.    Eval: Patient presents with primary complaint of increased pain in R shoulder. She has decreased AROM and increased pain with elevation, as well as decreased strength. She also has poor balance, with decreased scoring on balance test today. She is very shakey in thighs, knees and lower legs, which also effects balance. Pt with decreased ability for full functional activities due to pain and deficit, and will benefit from skilled PT to improve.   OBJECTIVE IMPAIRMENTS: Abnormal gait, decreased activity tolerance, decreased balance, decreased coordination, decreased endurance, decreased knowledge of use of DME, decreased mobility, difficulty walking, decreased ROM, decreased strength, increased muscle spasms, and pain.   ACTIVITY LIMITATIONS: carrying, lifting, standing, stairs, transfers, dressing, reach over head, hygiene/grooming, and locomotion level  PARTICIPATION LIMITATIONS: meal prep, cleaning, laundry, shopping, and community activity  PERSONAL FACTORS: Time since onset of injury/illness/exacerbation are also affecting patient's functional outcome.   REHAB POTENTIAL: Good  CLINICAL DECISION MAKING: Stable/uncomplicated  EVALUATION COMPLEXITY: Low   GOALS: Goals reviewed with patient? Yes  SHORT TERM GOALS: Target date: 12/25/22  Pt to be independent with initial HEP  Goal status: INITIAL  2.  Pt to hold rhomberg stance for at  least 20 sec.   Goal status: INITIAL  3.  Pt to perform sit to stand without use of UE from regular chair height   Goal status: INITIAL   LONG TERM GOALS: Target date: 02/05/23  Pt to be independent with final HEP  Goal status: INITIAL  2.  Pt to report decreased pain in R shoulder to -3/10 with activity and elevation for improved ADLs.   Goal status: INITIAL  3.  Pt to demo improved score on BERG by at least 6 points.  :  Goal status: INITIAL  4.  Pt to demo improved static and dynamic balance to be [facility] for pt age. , to improve safety with community activity.   Goal status: INITIAL    PLAN:  PT FREQUENCY: 1-2x/week  PT DURATION: 8 weeks  PLANNED INTERVENTIONS: Therapeutic exercises, Therapeutic activity, Neuromuscular re-education, Balance training, Gait training, Patient/Family education, Self Care, Joint mobilization, Joint manipulation, Orthotic/Fit training, DME instructions, Aquatic Therapy, Dry Needling, Electrical stimulation, Spinal manipulation, Spinal mobilization, Cryotherapy, Moist heat, Taping, Traction, Ultrasound, Ionotophoresis 60m/ml Dexamethasone, and Manual therapy  PLAN FOR NEXT SESSION: static balance, rhomberg stance, staggered stance, step ups.   LLyndee Hensen PT, DPT 12:17 PM  01/06/23

## 2023-01-08 ENCOUNTER — Ambulatory Visit: Payer: Medicare PPO | Admitting: Physical Therapy

## 2023-01-08 ENCOUNTER — Encounter: Payer: Self-pay | Admitting: Physical Therapy

## 2023-01-08 DIAGNOSIS — M6281 Muscle weakness (generalized): Secondary | ICD-10-CM | POA: Diagnosis not present

## 2023-01-08 DIAGNOSIS — R2689 Other abnormalities of gait and mobility: Secondary | ICD-10-CM

## 2023-01-08 NOTE — Therapy (Signed)
OUTPATIENT PHYSICAL THERAPY TREATMENT   Patient Name: Gloria Fields MRN: [id number] DOB:1946/07/02, 77 y.o., female Today's Date: 01/08/2023  END OF SESSION:  PT End of Session - 01/10/23 1322     Visit Number 8    Number of Visits 16    Date for PT Re-Evaluation 02/05/23    Authorization Type [employer]    PT Start Time 1230    PT Stop Time 1300    PT Time Calculation (min) 30 min    Equipment Utilized During Treatment Gait belt    Activity Tolerance Patient tolerated treatment well    Behavior During Therapy WFL for tasks assessed/performed              Past Medical History:  Diagnosis Date   ADD (attention deficit disorder)    Allergy    Anemia, normocytic normochromic 12/25/2012   Mild, chronic, Hb 11-12 grams at least since 11/2006; screening studies & endoscopies unremarkable   Anxiety    Back pain    Breast cancer ([place]) 2007   Chronic kidney disease    hx of stage 3 kidney disease    Depression    Diverticulosis of colon (without mention of hemorrhage)    Esophageal reflux    H/O dehydration    Headache    PMH   Heart murmur    History of blood transfusion    History of radiation therapy    Hypercholesterolemia    Hypertension    Irritable bowel syndrome    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 ([place]) 12/24/2013   Osteopenia    Ovarian cystic mass 07/07/2015   Personal history of radiation therapy 2008   PONV (postoperative nausea and vomiting)    also had severe itching    Pyloric stenosis    Repeated falls    Shortness of breath dyspnea    walking distances or climbing stairs   Past Surgical History:  Procedure Laterality Date   APPENDECTOMY  2007   perforated appendicitis   BREAST LUMPECTOMY  2007   right   COLONOSCOPY     COLONOSCOPY WITH PROPOFOL N/A 07/07/2015   Procedure: COLONOSCOPY WITH PROPOFOL;  Surgeon: Gatha Mayer, MD;  Location[facility]COPY;  Service: Endoscopy;  Laterality: N/A;  NEED ULTRA SLIM  COLONOSCOPE PLEASE, possible pyloric dilation   ESOPHAGOGASTRODUODENOSCOPY     ESOPHAGOGASTRODUODENOSCOPY N/A 07/07/2015   Procedure: ESOPHAGOGASTRODUODENOSCOPY (EGD);  Surgeon: Gatha Mayer, MD;  Location: [facility] ENDOSCOPY;  Service: Endoscopy;  Laterality: N/A;  POSSIBLE PYLORIC DILATION   HERNIA REPAIR Left    L and ventral   LAPAROSCOPIC BILATERAL SALPINGO OOPHERECTOMY Bilateral 10/12/2015   Procedure: LAPAROSCOPIC  LEFT SALPINGO OOPHORECTOMY AND LYSIS OF ADHESIONS ;  Surgeon: Everitt Amber, MD;  Location: [facility] ORS;  Service: Gynecology;  Laterality: Bilateral;   LAPAROSCOPIC LYSIS OF ADHESIONS  10/29/2012   Procedure: LAPAROSCOPIC LYSIS OF ADHESIONS;  Surgeon: Pedro Earls, MD;  Location: [facility] ORS;  Service: General;;   UPPER GASTROINTESTINAL ENDOSCOPY  Multiple   UTERINE FIBROID SURGERY     with subsequent scar tissue removal   VENTRAL HERNIA REPAIR  10/29/2012   Procedure: LAPAROSCOPIC VENTRAL HERNIA;  Surgeon: Pedro Earls, MD;  Location: [facility] ORS;  Service: General;  Laterality: N/A;  [place] LYSIS OF ADHESIONS    VENTRAL HERNIA REPAIR N/A 10/12/2015   Procedure: LAPAROSCOPIC VENTRAL HERNIA REPAIR ;  Surgeon: Johnathan Hausen, MD;  Location: [facility] ORS;  Service: General;  Laterality: N/A;  WISDOM TOOTH EXTRACTION     Patient Active Problem List   Diagnosis Date Noted   Primary hypertension 11/05/2022   Gait abnormality 11/05/2022   Polyarthralgia 11/05/2022   Anxiety state 09/21/2018   Depression 09/21/2018   ADD (attention deficit disorder) 09/21/2018   Ventral hernia 10/12/2015   Endometrial stripe increased 07/28/2015   Ovarian cancer screening 07/19/2015   Ovarian cystic mass - left 07/07/2015   Colon cancer screening    Stenosis of colon ([place])    Diverticulosis of colon without hemorrhage    Pyloric stenosis, acquired    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 ([place]) 12/24/2013   Anemia, normocytic  normochromic 12/25/2012   Nausea with vomiting 08/28/2011   Esophageal reflux 08/28/2011   Pyloric channel ulcer 08/28/2011   GERD (gastroesophageal reflux disease) 08/15/2011   Depression, major, recurrent ([place]) 08/15/2011   Anxiety disorder 08/15/2011   Diverticulosis of colon (without mention of hemorrhage) 08/15/2011    PCP: Tawnya Crook  REFERRING PROVIDER: Tawnya Crook  REFERRING DIAG: R shoulder pain, gait instability   THERAPY DIAG:  Other abnormalities of gait and mobility  Muscle weakness (generalized)  Rationale for Evaluation and Treatment: Rehabilitation  ONSET DATE: 3 months: shoulder/  10 years: balance  SUBJECTIVE:   SUBJECTIVE STATEMENT: 01/08/2023  Pt with no new complaints. 15 min late to appt.   Eval:  R shoulder: getting better, over several months. Pain: 7/10 with use, Likes heat. R handed.  Lives w 5 cats and dog. Stairs: 4 to get in , has 2 rails Has cane and walker, does not use either.  Has only had 1 fall, but feels very wobbly  No neuropathy, but does get leg cramps.  No other knee/hip pain, mild low back pain at times.   PERTINENT HISTORY: breast CA with radiation, HTN, Hernia pain/bulge at times.   PAIN:  Are you having pain? Yes: NPRS scale: 7/10 Pain location: R shoulder Pain description: Sore intermittent Aggravating factors: none stated  Relieving factors: heat  PRECAUTIONS: Fall  WEIGHT BEARING RESTRICTIONS: No  FALLS:  Has patient fallen in last 6 months? Yes. Number of falls 1 No injury.    PLOF: Independent  PATIENT GOALS: improve balance, decreased shoulder pain    OBJECTIVE:   DIAGNOSTIC FINDINGS:   PATIENT SURVEYS:   COGNITION: Overall cognitive status: Within functional limits for tasks assessed     SENSATION: WFL  PALPATION: Pain in posterior shoulder, down into deltoid, Soreness in R UT Mild hypomobility of R shoulder   LOWER EXTREMITY ROM:  Hips/knees: WFL Ankles; mild limitation for EV  and DF.   Shoulder AROM:  R: Flexion: 120, abd: 100,    PROM: flex: 140 L: Flexion: 145, abd: 130,   Strength:   Shoulder:  R  flex, abd: 4/5 L  flex, abd: 4/5    LOWER EXTREMITY SPECIAL TESTS:    FUNCTIONAL TESTS:    GAIT: Distance walked: 50 Assistive device utilized: None Level of assistance: Complete Independence Comments: Shakey legs, decreased stability    TODAY'S TREATMENT:  DATE:   01/08/23: Therapeutic Exercise: Aerobic: Supine:  shoulder arom flexion x 15;  Seated:   Standing:   shoulder scaption arom x 10 bil;  Shoulder ER YTB 2 x 10;   Squats at mat table x 15;  Stretches:   Neuromuscular Re-education:  Modified tandem stance/staggered stance 20 sec x 2 bil;    Step ups 4 in no hands, using cane x 5 bil;  up/down 5 steps x 4 ,1 UE support; static standing wider BOS 1 min x 3;  with head turns L/R 2 x 10;  more narrow BOS 30 sec x 3 (unable to get to full rhomberg stance today).  Manual Therapy:  Gait training:    01/06/23: Therapeutic Exercise: Aerobic: Supine:  shoulder arom flexion x 15;  Seated:  Shoulder ER YTB 2 x 10;  Standing:   shoulder scaption arom x 10 bil;  Squats at mat table x 15;  Stretches:   Neuromuscular Re-education: L/R and A/P weight shifts x 20;  fwd and lateral stepping with weight shifts x 10ea bil ; Modified tandem stance/staggered stance 20 sec x 2 bil; then on 6 in stp 2  x 30 sec bil;   toe taps onto 6 in step x 20;  Step ups 4 in x 10 bil, 1 UE support;  up/down 5 steps x 4 ,1 UE support;  Manual Therapy:  Gait training:  Education and practice for walking with SPC with optimal sequencing and distance with cane. 45 ft  x 4.   01/01/23: Therapeutic Exercise: Aerobic: Supine:   Seated: sit to stand - reg chair , no UE support  x 10;  Standing:  Shoulder scap squeeze/low row x 15;  Squats at mat table x  10;  Stretches:   Neuromuscular Re-education: reaching outside of BOS 2 x 5 bil;  head turns L/R x 15; UE flexion x 10 bil;  fwd stepping with weight shifts x 10 bi at counter: Modified tandem stance/staggered stance 20 sec x 3 bil; then on 6 in stp 2  x 30 sec bil;   toe taps onto 6 in step x 20;   Manual Therapy:  Gait training:  Education and practice for walking with SPC with optimal sequencing and distance with cane. 45 ft  x 8.    12/30/22: Therapeutic Exercise: Aerobic: Supine:   Seated: sit to stand - reg height mat table  x 12;  Standing:  Shoulder scap squeeze/low row x 15;   At counter: HR, marching, hip abd x 20; reviewed Gastroc stretch at counter for HEP x 2 bil;  Stretches:   Neuromuscular Re-education: reaching outside of BOS 2 x 5 bil;  Torso turns x 10;  UE flexion x 10 bil;  Manual Therapy:  Gait training:  Education and practice for walking with SPC with optimal sequencing and distance with cane. 45 ft  x 8.    12/25/22: Therapeutic Exercise: Aerobic: Supine:   Seated:  shoulder rolls x 10;   ER with YTB 2x 10; sit to stand with education on set up and mechanics, higher mat table 3 x 5;  Standing:  Shoulder scap squeeze/low row at wall x 15;   At counter:  HR, marching , hip abd x 20;  Stretches:   Neuromuscular Re-education:Standing at counter: L/R and A/P weight shifts x 25 ea;  Walking , education on cane placement for improved sequencing and efficiency.  Manual Therapy:      PATIENT EDUCATION:  Education details: PT POC,  Exam findings, HEP Person educated: Patient Education method: Explanation, Demonstration, Tactile cues, Verbal cues, and Handouts Education comprehension: verbalized understanding, returned demonstration, verbal cues required, tactile cues required, and needs further education  HOME EXERCISE PROGRAM: Access Code: [facility]    ASSESSMENT:  CLINICAL IMPRESSION: Pt with more difficulty with static balance today. She has better ability  with movement and dynamic activities, walking, stairs, vs ability for static stance today, and as she reports same at home. She is unable to do rhomberg stance today, but was able to do at last visit. Pts legs/thighs very shaky with static balance today, but not causing LOB, just pt fear and unable to hold position for very long. Plan to continue static balance as tolerated.   Eval: Patient presents with primary complaint of increased pain in R shoulder. She has decreased AROM and increased pain with elevation, as well as decreased strength. She also has poor balance, with decreased scoring on balance test today. She is very shakey in thighs, knees and lower legs, which also effects balance. Pt with decreased ability for full functional activities due to pain and deficit, and will benefit from skilled PT to improve.   OBJECTIVE IMPAIRMENTS: Abnormal gait, decreased activity tolerance, decreased balance, decreased coordination, decreased endurance, decreased knowledge of use of DME, decreased mobility, difficulty walking, decreased ROM, decreased strength, increased muscle spasms, and pain.   ACTIVITY LIMITATIONS: carrying, lifting, standing, stairs, transfers, dressing, reach over head, hygiene/grooming, and locomotion level  PARTICIPATION LIMITATIONS: meal prep, cleaning, laundry, shopping, and community activity  PERSONAL FACTORS: Time since onset of injury/illness/exacerbation are also affecting patient's functional outcome.   REHAB POTENTIAL: Good  CLINICAL DECISION MAKING: Stable/uncomplicated  EVALUATION COMPLEXITY: Low   GOALS: Goals reviewed with patient? Yes  SHORT TERM GOALS: Target date: 12/25/22  Pt to be independent with initial HEP  Goal status: INITIAL  2.  Pt to hold rhomberg stance for at least 20 sec.   Goal status: INITIAL  3.  Pt to perform sit to stand without use of UE from regular chair height   Goal status: INITIAL   LONG TERM GOALS: Target date:  02/05/23  Pt to be independent with final HEP  Goal status: INITIAL  2.  Pt to report decreased pain in R shoulder to -3/10 with activity and elevation for improved ADLs.   Goal status: INITIAL  3.  Pt to demo improved score on BERG by at least 6 points.  :  Goal status: INITIAL  4.  Pt to demo improved static and dynamic balance to be [facility] for pt age. , to improve safety with community activity.   Goal status: INITIAL    PLAN:  PT FREQUENCY: 1-2x/week  PT DURATION: 8 weeks  PLANNED INTERVENTIONS: Therapeutic exercises, Therapeutic activity, Neuromuscular re-education, Balance training, Gait training, Patient/Family education, Self Care, Joint mobilization, Joint manipulation, Orthotic/Fit training, DME instructions, Aquatic Therapy, Dry Needling, Electrical stimulation, Spinal manipulation, Spinal mobilization, Cryotherapy, Moist heat, Taping, Traction, Ultrasound, Ionotophoresis 60m/ml Dexamethasone, and Manual therapy  PLAN FOR NEXT SESSION: static balance, rhomberg stance, staggered stance, step ups.   LLyndee Hensen PT, DPT 1:23 PM  01/10/23

## 2023-01-10 ENCOUNTER — Encounter: Payer: Self-pay | Admitting: Physical Therapy

## 2023-01-22 ENCOUNTER — Ambulatory Visit: Payer: Medicare PPO | Admitting: Physical Therapy

## 2023-01-22 ENCOUNTER — Encounter: Payer: Self-pay | Admitting: Physical Therapy

## 2023-01-22 DIAGNOSIS — M6281 Muscle weakness (generalized): Secondary | ICD-10-CM | POA: Diagnosis not present

## 2023-01-22 DIAGNOSIS — R2689 Other abnormalities of gait and mobility: Secondary | ICD-10-CM

## 2023-01-22 NOTE — Therapy (Signed)
OUTPATIENT PHYSICAL THERAPY TREATMENT   Patient Name: Gloria Fields MRN: [id number] DOB:1946/06/18, 77 y.o., female Today's Date: 2/82/2024  END OF SESSION:  PT End of Session - 01/22/23 1359     Visit Number 9    Number of Visits 16    Date for PT Re-Evaluation 02/05/23    Authorization Type [employer]    PT Start Time [id number]    PT Stop Time 1348    PT Time Calculation (min) 43 min    Equipment Utilized During Treatment Gait belt    Activity Tolerance Patient tolerated treatment well    Behavior During Therapy WFL for tasks assessed/performed               Past Medical History:  Diagnosis Date   ADD (attention deficit disorder)    Allergy    Anemia, normocytic normochromic 12/25/2012   Mild, chronic, Hb 11-12 grams at least since 11/2006; screening studies & endoscopies unremarkable   Anxiety    Back pain    Breast cancer ([place]) 2007   Chronic kidney disease    hx of stage 3 kidney disease    Depression    Diverticulosis of colon (without mention of hemorrhage)    Esophageal reflux    H/O dehydration    Headache    PMH   Heart murmur    History of blood transfusion    History of radiation therapy    Hypercholesterolemia    Hypertension    Irritable bowel syndrome    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 ([place]) 12/24/2013   Osteopenia    Ovarian cystic mass 07/07/2015   Personal history of radiation therapy 2008   PONV (postoperative nausea and vomiting)    also had severe itching    Pyloric stenosis    Repeated falls    Shortness of breath dyspnea    walking distances or climbing stairs   Past Surgical History:  Procedure Laterality Date   APPENDECTOMY  2007   perforated appendicitis   BREAST LUMPECTOMY  2007   right   COLONOSCOPY     COLONOSCOPY WITH PROPOFOL N/A 07/07/2015   Procedure: COLONOSCOPY WITH PROPOFOL;  Surgeon: Gatha Mayer, MD;  Location: [facility] ENDOSCOPY;  Service: Endoscopy;  Laterality: N/A;  NEED ULTRA SLIM  COLONOSCOPE PLEASE, possible pyloric dilation   ESOPHAGOGASTRODUODENOSCOPY     ESOPHAGOGASTRODUODENOSCOPY N/A 07/07/2015   Procedure: ESOPHAGOGASTRODUODENOSCOPY (EGD);  Surgeon: Gatha Mayer, MD;  Location: [facility] ENDOSCOPY;  Service: Endoscopy;  Laterality: N/A;  POSSIBLE PYLORIC DILATION   HERNIA REPAIR Left    L and ventral   LAPAROSCOPIC BILATERAL SALPINGO OOPHERECTOMY Bilateral 10/12/2015   Procedure: LAPAROSCOPIC  LEFT SALPINGO OOPHORECTOMY AND LYSIS OF ADHESIONS ;  Surgeon: Everitt Amber, MD;  Location: [facility] ORS;  Service: Gynecology;  Laterality: Bilateral;   LAPAROSCOPIC LYSIS OF ADHESIONS  10/29/2012   Procedure: LAPAROSCOPIC LYSIS OF ADHESIONS;  Surgeon: Pedro Earls, MD;  Location: [facility] ORS;  Service: General;;   UPPER GASTROINTESTINAL ENDOSCOPY  Multiple   UTERINE FIBROID SURGERY     with subsequent scar tissue removal   VENTRAL HERNIA REPAIR  10/29/2012   Procedure: LAPAROSCOPIC VENTRAL HERNIA;  Surgeon: Pedro Earls, MD;  Location: [facility] ORS;  Service: General;  Laterality: N/A;  [place], [place] LYSIS OF ADHESIONS    VENTRAL HERNIA REPAIR N/A 10/12/2015   Procedure: LAPAROSCOPIC VENTRAL HERNIA REPAIR ;  Surgeon: Johnathan Hausen, MD;  Location: [facility] ORS;  Service: General;  Laterality:  N/A;   WISDOM TOOTH EXTRACTION     Patient Active Problem List   Diagnosis Date Noted   Primary hypertension 11/05/2022   Gait abnormality 11/05/2022   Polyarthralgia 11/05/2022   Anxiety state 09/21/2018   Depression 09/21/2018   ADD (attention deficit disorder) 09/21/2018   Ventral hernia 10/12/2015   Endometrial stripe increased 07/28/2015   Ovarian cancer screening 07/19/2015   Ovarian cystic mass - left 07/07/2015   Colon cancer screening    Stenosis of colon ([place])    Diverticulosis of colon without hemorrhage    Pyloric stenosis, acquired    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 ([place]) 12/24/2013   Anemia, normocytic  normochromic 12/25/2012   Nausea with vomiting 08/28/2011   Esophageal reflux 08/28/2011   Pyloric channel ulcer 08/28/2011   GERD (gastroesophageal reflux disease) 08/15/2011   Depression, major, recurrent ([place]) 08/15/2011   Anxiety disorder 08/15/2011   Diverticulosis of colon (without mention of hemorrhage) 08/15/2011    PCP: Tawnya Crook  REFERRING PROVIDER: Tawnya Crook  REFERRING DIAG: R shoulder pain, gait instability   THERAPY DIAG:  Other abnormalities of gait and mobility  Muscle weakness (generalized)  Rationale for Evaluation and Treatment: Rehabilitation  ONSET DATE: 3 months: shoulder/  10 years: balance  SUBJECTIVE:   SUBJECTIVE STATEMENT: 01/22/2023  Pt last seen 2/14. She reports some improvement with ease of standing and doing household activities. Her R shoulder is still sore. Balance about the same.   Eval:  R shoulder: getting better, over several months. Pain: 7/10 with use, Likes heat. R handed.  Lives w 5 cats and dog. Stairs: 4 to get in , has 2 rails Has cane and walker, does not use either.  Has only had 1 fall, but feels very wobbly  No neuropathy, but does get leg cramps.  No other knee/hip pain, mild low back pain at times.   PERTINENT HISTORY: breast CA with radiation, HTN, Hernia pain/bulge at times.   PAIN:  Are you having pain? Yes: NPRS scale: 7/10 Pain location: R shoulder Pain description: Sore intermittent Aggravating factors: none stated  Relieving factors: heat  PRECAUTIONS: Fall  WEIGHT BEARING RESTRICTIONS: No  FALLS:  Has patient fallen in last 6 months? Yes. Number of falls 1 No injury.    PLOF: Independent  PATIENT GOALS: improve balance, decreased shoulder pain    OBJECTIVE:   DIAGNOSTIC FINDINGS:   PATIENT SURVEYS:   COGNITION: Overall cognitive status: Within functional limits for tasks assessed     SENSATION: WFL  PALPATION: Pain in posterior shoulder, down into deltoid, Soreness in R  UT Mild hypomobility of R shoulder   LOWER EXTREMITY ROM:  Hips/knees: WFL Ankles; mild limitation for EV and DF.   Shoulder AROM:  R: Flexion: 120, abd: 100,    PROM: flex: 140 L: Flexion: 145, abd: 130,   Strength:   Shoulder:  R  flex, abd: 4/5 L  flex, abd: 4/5    LOWER EXTREMITY SPECIAL TESTS:    FUNCTIONAL TESTS:    GAIT: Distance walked: 50 Assistive device utilized: None Level of assistance: Complete Independence Comments: Shakey legs, decreased stability    TODAY'S TREATMENT:  DATE:   01/22/23: Therapeutic Exercise: Aerobic: Supine:   Seated: Shoulder ER YTB 2 x 10; Standing:   shoulder scaption arom x 10 bil;  HR x 20; Hip ad 2x10 bil;  marching x 20;   Stretches:   Neuromuscular Re-education:  static standing regular width BOS 1 min x 3;  more narrow BOS 30 sec x 3 (unable to get to full rhomberg stance today). L/R and A/P weight shifts x 20 ea;  Manual Therapy:  Gait training:     01/08/23: Therapeutic Exercise: Aerobic: Supine:  shoulder arom flexion x 15;  Seated:   Standing:   shoulder scaption arom x 10 bil;  Shoulder ER YTB 2 x 10;   Squats at mat table x 15;  Stretches:   Neuromuscular Re-education:  Modified tandem stance/staggered stance 20 sec x 2 bil;    Step ups 4 in no hands, using cane x 5 bil;  up/down 5 steps x 4 ,1 UE support; static standing wider BOS 1 min x 3;  with head turns L/R 2 x 10;  more narrow BOS 30 sec x 3 (unable to get to full rhomberg stance today).  Manual Therapy:  Gait training:    01/06/23: Therapeutic Exercise: Aerobic: Supine:  shoulder arom flexion x 15;  Seated:  Shoulder ER YTB 2 x 10;  Standing:   shoulder scaption arom x 10 bil;  Squats at mat table x 15;  Stretches:   Neuromuscular Re-education: L/R and A/P weight shifts x 20;  fwd and lateral stepping with weight shifts x 10ea  bil ; Modified tandem stance/staggered stance 20 sec x 2 bil; then on 6 in stp 2  x 30 sec bil;   toe taps onto 6 in step x 20;  Step ups 4 in x 10 bil, 1 UE support;  up/down 5 steps x 4 ,1 UE support;  Manual Therapy:  Gait training:  Education and practice for walking with SPC with optimal sequencing and distance with cane. 45 ft  x 4.   01/01/23: Therapeutic Exercise: Aerobic: Supine:   Seated: sit to stand - reg chair , no UE support  x 10;  Standing:  Shoulder scap squeeze/low row x 15;  Squats at mat table x 10;  Stretches:   Neuromuscular Re-education: reaching outside of BOS 2 x 5 bil;  head turns L/R x 15; UE flexion x 10 bil;  fwd stepping with weight shifts x 10 bi at counter: Modified tandem stance/staggered stance 20 sec x 3 bil; then on 6 in stp 2  x 30 sec bil;   toe taps onto 6 in step x 20;   Manual Therapy:  Gait training:  Education and practice for walking with SPC with optimal sequencing and distance with cane. 45 ft  x 8.       PATIENT EDUCATION:  Education details: PT POC, Exam findings, HEP Person educated: Patient Education method: Explanation, Demonstration, Tactile cues, Verbal cues, and Handouts Education comprehension: verbalized understanding, returned demonstration, verbal cues required, tactile cues required, and needs further education  HOME EXERCISE PROGRAM: Access Code: [facility]    ASSESSMENT:  CLINICAL IMPRESSION: Pt continues to be very shaky with static standing balance. She has increased stiffness in legs , upper body and neck with standing, cued for slow breathing and trying to relax body in this position. She states low fear of falling in this position. Pt will benefit from continued care.   Eval: Patient presents with primary complaint of increased pain in  R shoulder. She has decreased AROM and increased pain with elevation, as well as decreased strength. She also has poor balance, with decreased scoring on balance test today. She is very  shakey in thighs, knees and lower legs, which also effects balance. Pt with decreased ability for full functional activities due to pain and deficit, and will benefit from skilled PT to improve.   OBJECTIVE IMPAIRMENTS: Abnormal gait, decreased activity tolerance, decreased balance, decreased coordination, decreased endurance, decreased knowledge of use of DME, decreased mobility, difficulty walking, decreased ROM, decreased strength, increased muscle spasms, and pain.   ACTIVITY LIMITATIONS: carrying, lifting, standing, stairs, transfers, dressing, reach over head, hygiene/grooming, and locomotion level  PARTICIPATION LIMITATIONS: meal prep, cleaning, laundry, shopping, and community activity  PERSONAL FACTORS: Time since onset of injury/illness/exacerbation are also affecting patient's functional outcome.   REHAB POTENTIAL: Good  CLINICAL DECISION MAKING: Stable/uncomplicated  EVALUATION COMPLEXITY: Low   GOALS: Goals reviewed with patient? Yes  SHORT TERM GOALS: Target date: 12/25/22  Pt to be independent with initial HEP  Goal status: INITIAL  2.  Pt to hold rhomberg stance for at least 20 sec.   Goal status: INITIAL  3.  Pt to perform sit to stand without use of UE from regular chair height   Goal status: INITIAL   LONG TERM GOALS: Target date: 02/05/23  Pt to be independent with final HEP  Goal status: INITIAL  2.  Pt to report decreased pain in R shoulder to -3/10 with activity and elevation for improved ADLs.   Goal status: INITIAL  3.  Pt to demo improved score on BERG by at least 6 points.  :  Goal status: INITIAL  4.  Pt to demo improved static and dynamic balance to be [facility] for pt age. , to improve safety with community activity.   Goal status: INITIAL    PLAN:  PT FREQUENCY: 1-2x/week  PT DURATION: 8 weeks  PLANNED INTERVENTIONS: Therapeutic exercises, Therapeutic activity, Neuromuscular re-education, Balance training, Gait training,  Patient/Family education, Self Care, Joint mobilization, Joint manipulation, Orthotic/Fit training, DME instructions, Aquatic Therapy, Dry Needling, Electrical stimulation, Spinal manipulation, Spinal mobilization, Cryotherapy, Moist heat, Taping, Traction, Ultrasound, Ionotophoresis '4mg'$ /ml Dexamethasone, and Manual therapy  PLAN FOR NEXT SESSION: static balance, rhomberg stance, staggered stance, step ups.   Lyndee Hensen, PT, DPT 2:00 PM  01/22/23

## 2023-01-24 ENCOUNTER — Other Ambulatory Visit: Payer: Self-pay | Admitting: Family Medicine

## 2023-01-27 ENCOUNTER — Ambulatory Visit: Payer: Medicare PPO | Admitting: Physical Therapy

## 2023-01-27 ENCOUNTER — Encounter: Payer: Self-pay | Admitting: Physical Therapy

## 2023-01-27 DIAGNOSIS — M6281 Muscle weakness (generalized): Secondary | ICD-10-CM

## 2023-01-27 DIAGNOSIS — R2689 Other abnormalities of gait and mobility: Secondary | ICD-10-CM

## 2023-01-27 NOTE — Therapy (Addendum)
OUTPATIENT PHYSICAL THERAPY TREATMENT /PN   Patient Name: Gloria Fields MRN: [id number] DOB:[birth year], 77 y.o., female Today's Date: 01/27/2023  END OF SESSION:  PT End of Session - 01/27/23 1213     Visit Number 10    Number of Visits 16    Date for PT Re-Evaluation 02/05/23    Authorization Type [employer]  PN done at visit 10.    PT Start Time 1215    PT Stop Time 1257    PT Time Calculation (min) 42 min    Equipment Utilized During Treatment Gait belt    Activity Tolerance Patient tolerated treatment well    Behavior During Therapy WFL for tasks assessed/performed             Physical Therapy Progress Note  Dates of Reporting Period: 12/11/22  to 01/27/23      Past Medical History:  Diagnosis Date   ADD (attention deficit disorder)    Allergy    Anemia, normocytic normochromic 12/25/2012   Mild, chronic, Hb 11-12 grams at least since 11/2006; screening studies & endoscopies unremarkable   Anxiety    Back pain    Breast cancer (HCC) 2007   Chronic kidney disease    hx of stage 3 kidney disease    Depression    Diverticulosis of colon (without mention of hemorrhage)    Esophageal reflux    H/O dehydration    Headache    PMH   Heart murmur    History of blood transfusion    History of radiation therapy    Hypercholesterolemia    Hypertension    Irritable bowel syndrome    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   Osteopenia    Ovarian cystic mass 07/07/2015   Personal history of radiation therapy 2008   PONV (postoperative nausea and vomiting)    also had severe itching    Pyloric stenosis    Repeated falls    Shortness of breath dyspnea    walking distances or climbing stairs   Past Surgical History:  Procedure Laterality Date   APPENDECTOMY  2007   perforated appendicitis   BREAST LUMPECTOMY  2007   right   COLONOSCOPY     COLONOSCOPY WITH PROPOFOL N/A 07/07/2015   Procedure: COLONOSCOPY WITH  PROPOFOL;  Surgeon: Iva Boop, MD;  Location: [facility] ENDOSCOPY;  Service: Endoscopy;  Laterality: N/A;  NEED ULTRA SLIM COLONOSCOPE PLEASE, possible pyloric dilation   ESOPHAGOGASTRODUODENOSCOPY     ESOPHAGOGASTRODUODENOSCOPY N/A 07/07/2015   Procedure: ESOPHAGOGASTRODUODENOSCOPY (EGD);  Surgeon: Iva Boop, MD;  Location: [facility] ENDOSCOPY;  Service: Endoscopy;  Laterality: N/A;  POSSIBLE PYLORIC DILATION   HERNIA REPAIR Left    L and ventral   LAPAROSCOPIC BILATERAL SALPINGO OOPHERECTOMY Bilateral 10/12/2015   Procedure: LAPAROSCOPIC  LEFT SALPINGO OOPHORECTOMY AND LYSIS OF ADHESIONS ;  Surgeon: Adolphus Birchwood, MD;  Location: [facility];  Service: Gynecology;  Laterality: Bilateral;   LAPAROSCOPIC LYSIS OF ADHESIONS  10/29/2012   Procedure: LAPAROSCOPIC LYSIS OF ADHESIONS;  Surgeon: Valarie Merino, MD;  Location: [facility] ORS;  Service: General;;   UPPER GASTROINTESTINAL ENDOSCOPY  Multiple   UTERINE FIBROID SURGERY     with subsequent scar tissue removal   VENTRAL HERNIA REPAIR  10/29/2012   Procedure: LAPAROSCOPIC VENTRAL HERNIA;  Surgeon: Valarie Merino, MD;  Location: [facility];  Service: General;  Laterality: N/A;  LAPRASCOPIC  ASSISTED OPEN VENTRAL HERNIA REPAIR, LAPRASCOPIC LYSIS OF ADHESIONS    VENTRAL HERNIA REPAIR N/A  10/12/2015   Procedure: LAPAROSCOPIC VENTRAL HERNIA REPAIR ;  Surgeon: Luretha Murphy, MD;  Location: [facility];  Service: General;  Laterality: N/A;   WISDOM TOOTH EXTRACTION     Patient Active Problem List   Diagnosis Date Noted   Primary hypertension 11/05/2022   Gait abnormality 11/05/2022   Polyarthralgia 11/05/2022   Anxiety state 09/21/2018   Depression 09/21/2018   ADD (attention deficit disorder) 09/21/2018   Ventral hernia 10/12/2015   Endometrial stripe increased 07/28/2015   Ovarian cancer screening 07/19/2015   Ovarian cystic mass - left 07/07/2015   Colon cancer screening    Stenosis of colon (HCC)    Diverticulosis of colon without hemorrhage    Pyloric  stenosis, acquired    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   Anemia, normocytic normochromic 12/25/2012   Nausea with vomiting 08/28/2011   Esophageal reflux 08/28/2011   Pyloric channel ulcer 08/28/2011   GERD (gastroesophageal reflux disease) 08/15/2011   Depression, major, recurrent (HCC) 08/15/2011   Anxiety disorder 08/15/2011   Diverticulosis of colon (without mention of hemorrhage) 08/15/2011    PCP: Jeani Sow  REFERRING PROVIDER: Jeani Sow  REFERRING DIAG: R shoulder pain, gait instability   THERAPY DIAG:  Other abnormalities of gait and mobility  Muscle weakness (generalized)  Rationale for Evaluation and Treatment: Rehabilitation  ONSET DATE: 3 months: shoulder/  10 years: balance  SUBJECTIVE:   SUBJECTIVE STATEMENT: 01/27/23: pt states shoulder has a dull , constant ache.  .   Eval:  R shoulder: getting better, over several months. Pain: 7/10 with use, Likes heat. R handed.  Lives w 5 cats and dog. Stairs: 4 to get in , has 2 rails Has cane and walker, does not use either.  Has only had 1 fall, but feels very wobbly  No neuropathy, but does get leg cramps.  No other knee/hip pain, mild low back pain at times.   PERTINENT HISTORY: breast CA with radiation, HTN, Hernia pain/bulge at times.   PAIN:  Are you having pain? Yes: NPRS scale: 7/10 Pain location: R shoulder Pain description: Sore intermittent Aggravating factors: none stated  Relieving factors: heat  PRECAUTIONS: Fall  WEIGHT BEARING RESTRICTIONS: No  FALLS:  Has patient fallen in last 6 months? Yes. Number of falls 1 No injury.    PLOF: Independent  PATIENT GOALS: improve balance, decreased shoulder pain    OBJECTIVE: updated 01/27/23  DIAGNOSTIC FINDINGS:   PATIENT SURVEYS:   COGNITION: Overall cognitive status: Within functional limits for tasks assessed     SENSATION: WFL  PALPATION: Pain in posterior shoulder, down into  deltoid, Soreness in R UT Mild hypomobility of R shoulder   LOWER EXTREMITY ROM:  Hips/knees: WFL Ankles; mild limitation for EV and DF.   Shoulder AROM:  R: Flexion: 120, abd: 110,    PROM: flex: 140 L: Flexion: 145, abd: 130,   Strength:   Shoulder:  R  flex, abd: 4/5 L  flex, abd: 4/5    LOWER EXTREMITY SPECIAL TESTS:    FUNCTIONAL TESTS:    GAIT: Distance walked: 50 Assistive device utilized: None Level of assistance: Complete Independence Comments: Shakey legs, decreased stability    TODAY'S TREATMENT:  DATE:   01/22/23: Therapeutic Exercise: Aerobic: Supine:   Seated: Shoulder ER YTB 2 x 10; Standing:   shoulder scaption arom x 10 bil;  HR x 20; Hip ad 2x10 bil;  marching x 20;   Stretches:   Neuromuscular Re-education:  static standing regular width BOS 1 min x 3;  more narrow BOS 30 sec x 3 (unable to get to full rhomberg stance today). L/R and A/P weight shifts x 20 ea; fwd weight shifts with stepping x 10 bil;  Manual Therapy:  Gait training:     01/08/23: Therapeutic Exercise: Aerobic: Supine:  shoulder arom flexion x 15;  Seated:   Standing:   shoulder scaption arom x 10 bil;  Shoulder ER YTB 2 x 10;   Squats at mat table x 15;  Stretches:   Neuromuscular Re-education:  Modified tandem stance/staggered stance 20 sec x 2 bil;    Step ups 4 in no hands, using cane x 5 bil;  up/down 5 steps x 4 ,1 UE support; static standing wider BOS 1 min x 3;  with head turns L/R 2 x 10;  more narrow BOS 30 sec x 3 (unable to get to full rhomberg stance today).  Manual Therapy:  Gait training:    01/06/23: Therapeutic Exercise: Aerobic: Supine:  shoulder arom flexion x 15;  Seated:  Shoulder ER YTB 2 x 10;  Standing:   shoulder scaption arom x 10 bil;  Squats at mat table x 15;  Stretches:   Neuromuscular Re-education: L/R and A/P weight  shifts x 20;  fwd and lateral stepping with weight shifts x 10ea bil ; Modified tandem stance/staggered stance 20 sec x 2 bil; then on 6 in stp 2  x 30 sec bil;   toe taps onto 6 in step x 20;  Step ups 4 in x 10 bil, 1 UE support;  up/down 5 steps x 4 ,1 UE support;  Manual Therapy:  Gait training:  Education and practice for walking with SPC with optimal sequencing and distance with cane. 45 ft  x 4.   01/01/23: Therapeutic Exercise: Aerobic: Supine:   Seated: sit to stand - reg chair , no UE support  x 10;  Standing:  Shoulder scap squeeze/low row x 15;  Squats at mat table x 10;  Stretches:   Neuromuscular Re-education: reaching outside of BOS 2 x 5 bil;  head turns L/R x 15; UE flexion x 10 bil;  fwd stepping with weight shifts x 10 bi at counter: Modified tandem stance/staggered stance 20 sec x 3 bil; then on 6 in stp 2  x 30 sec bil;   toe taps onto 6 in step x 20;   Manual Therapy:  Gait training:  Education and practice for walking with SPC with optimal sequencing and distance with cane. 45 ft  x 8.       PATIENT EDUCATION:  Education details: PT POC, Exam findings, HEP Person educated: Patient Education method: Explanation, Demonstration, Tactile cues, Verbal cues, and Handouts Education comprehension: verbalized understanding, returned demonstration, verbal cues required, tactile cues required, and needs further education  HOME EXERCISE PROGRAM: Access Code: [facility]    ASSESSMENT:  CLINICAL IMPRESSION: Pt with much difficulty with static standing. She has increased anxiety and sweating response with trials for static standing at each visit. She has much less difficulty with more dynamic activity. Discussed practicing static standing at home in front of chair when watching tv for more of a distraction. She is not unsafe or  losing balance with standing, but legs very shakey and pt much more fearful. She continues to have mild pain in R shoulder. She is doing well with  shoulder ROM and light strengthening, but continues to have soreness. Pt to benefit from continued care for improving balance, stability, and may benefit from referral to ortho if shoulder does not improve, as well as neurology if shakiness does not improve in Legs. Discussed continued work with behavior health for anxiety, which pt is currently already doing.   Eval: Patient presents with primary complaint of increased pain in R shoulder. She has decreased AROM and increased pain with elevation, as well as decreased strength. She also has poor balance, with decreased scoring on balance test today. She is very shakey in thighs, knees and lower legs, which also effects balance. Pt with decreased ability for full functional activities due to pain and deficit, and will benefit from skilled PT to improve.   OBJECTIVE IMPAIRMENTS: Abnormal gait, decreased activity tolerance, decreased balance, decreased coordination, decreased endurance, decreased knowledge of use of DME, decreased mobility, difficulty walking, decreased ROM, decreased strength, increased muscle spasms, and pain.   ACTIVITY LIMITATIONS: carrying, lifting, standing, stairs, transfers, dressing, reach over head, hygiene/grooming, and locomotion level  PARTICIPATION LIMITATIONS: meal prep, cleaning, laundry, shopping, and community activity  PERSONAL FACTORS: Time since onset of injury/illness/exacerbation are also affecting patient's functional outcome.   REHAB POTENTIAL: Good  CLINICAL DECISION MAKING: Stable/uncomplicated  EVALUATION COMPLEXITY: Low   GOALS: Goals reviewed with patient? Yes  SHORT TERM GOALS: Target date: 12/25/22  Pt to be independent with initial HEP  Goal status: MET  2.  Pt to hold rhomberg stance for at least 20 sec.   Goal status: NOT MET  3.  Pt to perform sit to stand without use of UE from regular chair height   Goal status: MET   LONG TERM GOALS: Target date: 02/05/23  Pt to be independent  with final HEP  Goal status: IN PROGRESS  2.  Pt to report decreased pain in R shoulder to -3/10 with activity and elevation for improved ADLs.   Goal status: IN PROGRESS  3.  Pt to demo improved score on BERG by at least 6 points.  :  Goal status: NOT MET  4.  Pt to demo improved static and dynamic balance to be [facility] for pt age. , to improve safety with community activity.   Goal status: NOT MET    PLAN:  PT FREQUENCY: 1-2x/week  PT DURATION: 8 weeks  PLANNED INTERVENTIONS: Therapeutic exercises, Therapeutic activity, Neuromuscular re-education, Balance training, Gait training, Patient/Family education, Self Care, Joint mobilization, Joint manipulation, Orthotic/Fit training, DME instructions, Aquatic Therapy, Dry Needling, Electrical stimulation, Spinal manipulation, Spinal mobilization, Cryotherapy, Moist heat, Taping, Traction, Ultrasound, Ionotophoresis 4mg /ml Dexamethasone, and Manual therapy  PLAN FOR NEXT SESSION: static balance, rhomberg stance, staggered stance, step ups.   Sedalia Muta, PT, DPT 2:53 PM  01/27/23   PHYSICAL THERAPY DISCHARGE SUMMARY  Visits from Start of Care: 10   Plan: Patient agrees to discharge.  Patient goals were partially  met. Patient is being discharged due to  - Pt did not return after last visit.     Sedalia Muta, PT, DPT 1:40 PM  06/26/23

## 2023-01-29 ENCOUNTER — Ambulatory Visit: Payer: Medicare PPO | Admitting: Psychiatry

## 2023-01-30 ENCOUNTER — Encounter: Payer: Medicare PPO | Admitting: Physical Therapy

## 2023-02-03 ENCOUNTER — Encounter: Payer: Medicare PPO | Admitting: Physical Therapy

## 2023-02-05 ENCOUNTER — Ambulatory Visit: Payer: Medicare PPO | Admitting: Psychiatry

## 2023-02-05 NOTE — Progress Notes (Signed)
No show

## 2023-02-06 ENCOUNTER — Encounter: Payer: Medicare PPO | Admitting: Physical Therapy

## 2023-02-11 ENCOUNTER — Encounter: Payer: Medicare PPO | Admitting: Physical Therapy

## 2023-02-13 ENCOUNTER — Encounter: Payer: Medicare PPO | Admitting: Physical Therapy

## 2023-02-18 ENCOUNTER — Encounter: Payer: Medicare PPO | Admitting: Physical Therapy

## 2023-02-20 ENCOUNTER — Encounter: Payer: Medicare PPO | Admitting: Physical Therapy

## 2023-02-25 ENCOUNTER — Encounter: Payer: Medicare PPO | Admitting: Physical Therapy

## 2023-02-27 ENCOUNTER — Encounter: Payer: Medicare PPO | Admitting: Physical Therapy

## 2023-03-03 ENCOUNTER — Telehealth: Payer: Self-pay | Admitting: Family Medicine

## 2023-03-03 NOTE — Telephone Encounter (Signed)
Copied from CRM [id number]. Topic: Medicare AWV >> Mar 03, 2023 12:57 PM Gwenith Spitz wrote: Reason for CRM: Called patient to schedule Medicare Annual Wellness Visit (AWV). Left message for patient to call back and schedule Medicare Annual Wellness Visit (AWV).  Last date of AWV: N/A  Please schedule an appointment at any time with Inetta Fermo, [facility]. Please schedule AWVS with Inetta Fermo, [facility].  If any questions, please contact me at [phone].  Thank you ,  Gabriel Cirri [facility] AWV TEAM Direct Dial [phone]

## 2023-03-04 ENCOUNTER — Encounter: Payer: Medicare PPO | Admitting: Physical Therapy

## 2023-03-06 ENCOUNTER — Encounter: Payer: Medicare PPO | Admitting: Physical Therapy

## 2023-03-11 ENCOUNTER — Encounter: Payer: Medicare PPO | Admitting: Physical Therapy

## 2023-04-01 ENCOUNTER — Other Ambulatory Visit: Payer: Self-pay | Admitting: Psychiatry

## 2023-04-01 DIAGNOSIS — F3342 Major depressive disorder, recurrent, in full remission: Secondary | ICD-10-CM

## 2023-04-01 DIAGNOSIS — F411 Generalized anxiety disorder: Secondary | ICD-10-CM

## 2023-04-01 DIAGNOSIS — F4001 Agoraphobia with panic disorder: Secondary | ICD-10-CM

## 2023-04-02 NOTE — Telephone Encounter (Signed)
Please call to schedule an appt  

## 2023-04-03 NOTE — Telephone Encounter (Signed)
Lvm for pt to call and schedule 

## 2023-04-08 ENCOUNTER — Encounter: Payer: Self-pay | Admitting: Family Medicine

## 2023-04-08 ENCOUNTER — Ambulatory Visit: Payer: Medicare PPO | Admitting: Family Medicine

## 2023-04-08 VITALS — BP 142/78 | HR 80 | Temp 98.2°F | Resp 18 | Ht 61.0 in | Wt 142.0 lb

## 2023-04-08 DIAGNOSIS — Z79899 Other long term (current) drug therapy: Secondary | ICD-10-CM | POA: Diagnosis not present

## 2023-04-08 DIAGNOSIS — I10 Essential (primary) hypertension: Secondary | ICD-10-CM

## 2023-04-08 DIAGNOSIS — K219 Gastro-esophageal reflux disease without esophagitis: Secondary | ICD-10-CM

## 2023-04-08 DIAGNOSIS — E559 Vitamin D deficiency, unspecified: Secondary | ICD-10-CM | POA: Diagnosis not present

## 2023-04-08 DIAGNOSIS — E7849 Other hyperlipidemia: Secondary | ICD-10-CM | POA: Diagnosis not present

## 2023-04-08 DIAGNOSIS — F411 Generalized anxiety disorder: Secondary | ICD-10-CM

## 2023-04-08 MED ORDER — OMEPRAZOLE 40 MG PO CPDR
40.0000 mg | DELAYED_RELEASE_CAPSULE | Freq: Every day | ORAL | 1 refills | Status: DC
Start: 2023-04-08 — End: 2023-10-12

## 2023-04-08 NOTE — Assessment & Plan Note (Signed)
Chronic.  Controlled when on medications(getting OTC (available over the counter without a prescription)).  Prescription for omeprazole 40 mg daily

## 2023-04-08 NOTE — Assessment & Plan Note (Signed)
Chronic.  Felt better on prescription.  Check level

## 2023-04-08 NOTE — Assessment & Plan Note (Signed)
Chronic.  Controlled on medications.  Managed by mental health

## 2023-04-08 NOTE — Progress Notes (Signed)
Subjective:     Patient ID: Gloria Fields, female    DOB: 16-Aug-1946, 77 y.o.   MRN: [id number]  Chief Complaint  Patient presents with   Medical Management of Chronic Issues    6 month follow-up Muscle aches Get SOB sometimes    HPI 1j HYPERTENSION-on valsartan 80 mg.  Can't find cuff.  No headache(s)/dizziness/chest pain/edema.  Chronic dyspnea on exertion-deconditioning.  Didn't take medications this am.   2.  HLD-not want medications.  No exercise.  3.  Moods/attention deficit disorder-stable on medications.  Seeing mental health.  No SI. 4.  Unsteady on feet.  Physical therapy helped.  Doesn't get out much(choice) 5.  GERD-doing well mostly  occasional loose stools.  Taking OTC (available over the counter without a prescription).  Omeprazole. No dysphagia 6.  Vitamin D-did better on prescription     Health Maintenance Due  Topic Date Due   Medicare Annual Wellness (AWV)  Never done   Hepatitis C Screening  Never done   DTaP/Tdap/Td (1 - Tdap) Never done    Past Medical History:  Diagnosis Date   ADD (attention deficit disorder)    Allergy    Anemia, normocytic normochromic 12/25/2012   Mild, chronic, Hb 11-12 grams at least since 11/2006; screening studies & endoscopies unremarkable   Anxiety    Back pain    Breast cancer (HCC) 2007   Chronic kidney disease    hx of stage 3 kidney disease    Depression    Diverticulosis of colon (without mention of hemorrhage)    Esophageal reflux    H/O dehydration    Headache    PMH   Heart murmur    History of blood transfusion    History of radiation therapy    Hypercholesterolemia    Hypertension    Irritable bowel syndrome    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   Osteopenia    Ovarian cystic mass 07/07/2015   Personal history of radiation therapy 2008   PONV (postoperative nausea and vomiting)    also had severe itching    Pyloric stenosis    Repeated falls     Shortness of breath dyspnea    walking distances or climbing stairs    Past Surgical History:  Procedure Laterality Date   APPENDECTOMY  2007   perforated appendicitis   BREAST LUMPECTOMY  2007   right   COLONOSCOPY     COLONOSCOPY WITH PROPOFOL N/A 07/07/2015   Procedure: COLONOSCOPY WITH PROPOFOL;  Surgeon: Iva Boop, MD;  Location[facility] ENDOSCOPY;  Service: Endoscopy;  Laterality: N/A;  NEED ULTRA SLIM COLONOSCOPE PLEASE, possible pyloric dilation   ESOPHAGOGASTRODUODENOSCOPY     ESOPHAGOGASTRODUODENOSCOPY N/A 07/07/2015   Procedure: ESOPHAGOGASTRODUODENOSCOPY (EGD);  Surgeon: Iva Boop, MD;  Location: [facility] ENDOSCOPY;  Service: Endoscopy;  Laterality: N/A;  POSSIBLE PYLORIC DILATION   HERNIA REPAIR Left    L and ventral   LAPAROSCOPIC BILATERAL SALPINGO OOPHERECTOMY Bilateral 10/12/2015   Procedure: LAPAROSCOPIC  LEFT SALPINGO OOPHORECTOMY AND LYSIS OF ADHESIONS ;  Surgeon: Adolphus Birchwood, MD;  Location: [facility];  Service: Gynecology;  Laterality: Bilateral;   LAPAROSCOPIC LYSIS OF ADHESIONS  10/29/2012   Procedure: LAPAROSCOPIC LYSIS OF ADHESIONS;  Surgeon: Valarie Merino, MD;  Location: [facility];  Service: General;;   UPPER GASTROINTESTINAL ENDOSCOPY  Multiple   UTERINE FIBROID SURGERY     with subsequent scar tissue removal   VENTRAL HERNIA REPAIR  10/29/2012   Procedure: LAPAROSCOPIC  VENTRAL HERNIA;  Surgeon: Valarie Merino, MD;  Location: [facility];  Service: General;  Laterality: N/A;  LAPRASCOPIC  ASSISTED OPEN VENTRAL HERNIA REPAIR, LAPRASCOPIC LYSIS OF ADHESIONS    VENTRAL HERNIA REPAIR N/A 10/12/2015   Procedure: LAPAROSCOPIC VENTRAL HERNIA REPAIR ;  Surgeon: Luretha Murphy, MD;  Location: [facility];  Service: General;  Laterality: N/A;   WISDOM TOOTH EXTRACTION       Current Outpatient Medications:    amphetamine-dextroamphetamine (ADDERALL) 10 MG tablet, Take 1 tablet (10 mg total) by mouth 3 (three) times daily., Disp: 270 tablet, Rfl: 0   antiseptic oral rinse  (BIOTENE) LIQD, 15 mLs by Mouth Rinse route as needed for dry mouth (uses when she brushes her teeth)., Disp: , Rfl:    busPIRone (BUSPAR) 30 MG tablet, Take 1 tablet (30 mg total) by mouth 2 (two) times daily., Disp: 180 tablet, Rfl: 1   clonazePAM (KLONOPIN) 0.5 MG tablet, Take 1 tablet (0.5 mg total) by mouth 3 (three) times daily. (Patient taking differently: Take 0.5 mg by mouth 3 (three) times daily. PRN), Disp: 60 tablet, Rfl: 4   clotrimazole-betamethasone (LOTRISONE) cream, APPLY TO AFFECTED AREA TOPICALLY 3 TIMES A DAY FOR 3 MONTHS, Disp: , Rfl:    cycloSPORINE (RESTASIS) 0.05 % ophthalmic emulsion, 1 drop 2 (two) times daily., Disp: , Rfl:    gabapentin (NEURONTIN) 100 MG capsule, TAKE 1 CAPSULE BY MOUTH TWICE A DAY, Disp: 180 capsule, Rfl: 0   Hypromellose 0.3 % SOLN, Apply 1 drop to eye 2 (two) times daily., Disp: , Rfl:    Omega-3 Fatty Acids (FISH OIL) 1000 MG CAPS, Take 1,000 mg by mouth 2 (two) times daily., Disp: , Rfl:    omeprazole (PRILOSEC) 40 MG capsule, Take 1 capsule (40 mg total) by mouth daily., Disp: 90 capsule, Rfl: 1   ondansetron (ZOFRAN) 4 MG tablet, Take 1 tablet (4 mg total) by mouth every 8 (eight) hours as needed for nausea or vomiting., Disp: 20 tablet, Rfl: 0   PREVIDENT 5000 DRY MOUTH 1.1 % GEL dental gel, , Disp: , Rfl:    sertraline (ZOLOFT) 100 MG tablet, TAKE 2 TABLETS BY MOUTH EVERY DAY, Disp: 180 tablet, Rfl: 1   traZODone (DESYREL) 150 MG tablet, Take 1-2 tablets (150-300 mg total) by mouth daily., Disp: 180 tablet, Rfl: 1   valsartan (DIOVAN) 80 MG tablet, Take 80 mg by mouth daily., Disp: , Rfl:    Vitamin D, Ergocalciferol, (DRISDOL) 1.25 MG (50000 UNIT) CAPS capsule, TAKE 1 CAPSULE (50,000 UNITS TOTAL) BY MOUTH EVERY 7 (SEVEN) DAYS FOR 12 DOSES., Disp: 12 capsule, Rfl: 0  Allergies  Allergen Reactions   Morphine Other (See Comments)    REACTION: paranoid   ROS neg/noncontributory except as noted HPI/below      Objective:     BP (!) 142/78  (BP Location: Left Arm, Patient Position: Sitting, Cuff Size: Normal)   Pulse 80   Temp 98.2 F (36.8 C) (Temporal)   Resp 18   Ht 5\' 1"  (1.549 m)   Wt 142 lb (64.4 kg)   SpO2 98%   BMI 26.83 kg/m  Wt Readings from Last 3 Encounters:  04/08/23 142 lb (64.4 kg)  11/05/22 145 lb 8 oz (66 kg)  04/23/19 163 lb (73.9 kg)    Physical Exam   Gen: WDWN NAD HEENT: NCAT, conjunctiva not injected, sclera nonicteric NECK:  supple, no thyromegaly, no nodes, no carotid bruits CARDIAC: RRR, S1S2+,1/6 murmur. DP 1+B LUNGS: CTAB. No wheezes ABDOMEN:  BS+, soft, NTND, No HSM, no masses EXT:  no edema MSK: no gross abnormalities.  NEURO: A&O x3.  CN II-XII intact.  PSYCH: normal mood. Good eye contact     Assessment & Plan:  Primary hypertension -     CBC with Differential/Platelet -     Comprehensive metabolic panel -     TSH  Gastroesophageal reflux disease without esophagitis -     CBC with Differential/Platelet -     Vitamin B12  Other hyperlipidemia -     Lipid panel  Vitamin D deficiency -     VITAMIN D 25 Hydroxy (Vit-D Deficiency, Fractures)  High risk medication use -     CBC with Differential/Platelet -     Magnesium -     Vitamin B12  Other orders -     Omeprazole; Take 1 capsule (40 mg total) by mouth daily.  Dispense: 90 capsule; Refill: 1  Follow up 6 mo  Angelena Sole, MD

## 2023-04-08 NOTE — Assessment & Plan Note (Signed)
Chronic.  Seems controlled(no medications today).  Continue valsartan 80 mg(has plenty).  Check labs

## 2023-04-08 NOTE — Patient Instructions (Signed)
One tiny task per day.

## 2023-04-08 NOTE — Assessment & Plan Note (Signed)
Patient declines medications due to Dad's reaction in past and patient with a lot of myalgias.

## 2023-04-09 LAB — LIPID PANEL
Cholesterol: 194 mg/dL (ref 0–200)
HDL: 52.7 mg/dL (ref >39.00)
LDL Cholesterol: 118 mg/dL — ABNORMAL HIGH (ref 0–99)
NonHDL: 141.27
Total CHOL/HDL Ratio: 4
Triglycerides: 117 mg/dL (ref 0.0–149.0)
VLDL: 23.4 mg/dL (ref 0.0–40.0)

## 2023-04-09 LAB — CBC WITH DIFFERENTIAL/PLATELET
Basophils Absolute: 0 10*3/uL (ref 0.0–0.1)
Basophils Relative: 0.8 % (ref 0.0–3.0)
Eosinophils Absolute: 0.2 10*3/uL (ref 0.0–0.7)
Eosinophils Relative: 4 % (ref 0.0–5.0)
HCT: 30 % — ABNORMAL LOW (ref 36.0–46.0)
Hemoglobin: 10 g/dL — ABNORMAL LOW (ref 12.0–15.0)
Lymphocytes Relative: 25.7 % (ref 12.0–46.0)
Lymphs Abs: 1.6 10*3/uL (ref 0.7–4.0)
MCHC: 33.4 g/dL (ref 30.0–36.0)
MCV: 91.5 fl (ref 78.0–100.0)
Monocytes Absolute: 0.6 10*3/uL (ref 0.1–1.0)
Monocytes Relative: 9.4 % (ref 3.0–12.0)
Neutro Abs: 3.7 10*3/uL (ref 1.4–7.7)
Neutrophils Relative %: 60.1 % (ref 43.0–77.0)
Platelets: 188 10*3/uL (ref 150.0–400.0)
RBC: 3.28 Mil/uL — ABNORMAL LOW (ref 3.87–5.11)
RDW: 14.5 % (ref 11.5–15.5)
WBC: 6.2 10*3/uL (ref 4.0–10.5)

## 2023-04-09 LAB — COMPREHENSIVE METABOLIC PANEL
ALT: 11 U/L (ref 0–35)
AST: 15 U/L (ref 0–37)
Albumin: 3.9 g/dL (ref 3.5–5.2)
Alkaline Phosphatase: 57 U/L (ref 39–117)
BUN: 17 mg/dL (ref 6–23)
CO2: 27 mEq/L (ref 19–32)
Calcium: 8.8 mg/dL (ref 8.4–10.5)
Chloride: 107 mEq/L (ref 96–112)
Creatinine, Ser: 1.37 mg/dL — ABNORMAL HIGH (ref 0.40–1.20)
GFR: 37.75 mL/min — ABNORMAL LOW (ref >60.00)
Glucose, Bld: 104 mg/dL — ABNORMAL HIGH (ref 70–99)
Potassium: 4 mEq/L (ref 3.5–5.1)
Sodium: 141 mEq/L (ref 135–145)
Total Bilirubin: 0.4 mg/dL (ref 0.2–1.2)
Total Protein: 6.3 g/dL (ref 6.0–8.3)

## 2023-04-09 LAB — MAGNESIUM: Magnesium: 1.8 mg/dL (ref 1.5–2.5)

## 2023-04-09 LAB — TSH: TSH: 2.39 u[IU]/mL (ref 0.35–5.50)

## 2023-04-09 LAB — VITAMIN B12: Vitamin B-12: 508 pg/mL (ref 211–911)

## 2023-04-09 LAB — VITAMIN D 25 HYDROXY (VIT D DEFICIENCY, FRACTURES): VITD: 48.32 ng/mL (ref 30.00–100.00)

## 2023-04-09 NOTE — Progress Notes (Signed)
Labs are either normal or stable except Kidney function is a bit elevated-make sure not taking any Motrin ibuprofen.  Make sure you are drinking plenty of water.  Recheck BMP 1 to 2 months

## 2023-04-10 ENCOUNTER — Other Ambulatory Visit: Payer: Self-pay | Admitting: *Deleted

## 2023-04-10 ENCOUNTER — Telehealth: Payer: Self-pay | Admitting: Family Medicine

## 2023-04-10 DIAGNOSIS — R7989 Other specified abnormal findings of blood chemistry: Secondary | ICD-10-CM

## 2023-04-10 NOTE — Telephone Encounter (Signed)
Contacted Gloria Fields to schedule their annual wellness visit. Appointment made for 04/14/2023.  Gabriel Cirri [facility] AWV TEAM Direct Dial [phone]

## 2023-04-14 ENCOUNTER — Ambulatory Visit (INDEPENDENT_AMBULATORY_CARE_PROVIDER_SITE_OTHER): Payer: Medicare PPO

## 2023-04-14 VITALS — Wt 142.0 lb

## 2023-04-14 DIAGNOSIS — Z0000 Encounter for general adult medical examination without abnormal findings: Secondary | ICD-10-CM

## 2023-04-14 NOTE — Progress Notes (Signed)
I connected with  Gloria Fields on 04/14/23 by a audio enabled telemedicine application and verified that I am speaking with the correct person using two identifiers.  Patient Location: Home  Provider Location: Home Office  I discussed the limitations of evaluation and management by telemedicine. The patient expressed understanding and agreed to proceed.   Subjective:   Gloria Fields is a 77 y.o. female who presents for an Initial Medicare Annual Wellness Visit.  Review of Systems     Cardiac Risk Factors include: advanced age (>30men, >43 women);hypertension;dyslipidemia     Objective:    Today's Vitals   04/14/23 1437  Weight: 142 lb (64.4 kg)   Body mass index is 26.83 kg/m.     04/14/2023    2:43 PM 12/13/2022    1:28 PM 06/19/2016    9:16 AM 10/12/2015    5:30 PM 10/09/2015    8:23 AM 07/19/2015    9:35 AM 07/07/2015    6:23 AM  Advanced Directives  Does Patient Have a Medical Advance Directive? Yes No No No Yes Yes Yes  Type of Estate agent of [place];Living will    Healthcare Power of [employer] Power of [employer] Power of Attorney  Does patient want to make changes to medical advance directive?     No - Patient declined No - Patient declined   Copy of Healthcare Power of Attorney in Chart? No - copy requested    No - copy requested No - copy requested No - copy requested  Would patient like information on creating a medical advance directive?  No - Patient declined Yes - Educational materials given No - patient declined information       Current Medications (verified) Outpatient Encounter Medications as of 04/14/2023  Medication Sig   amphetamine-dextroamphetamine (ADDERALL) 10 MG tablet Take 1 tablet (10 mg total) by mouth 3 (three) times daily.   antiseptic oral rinse (BIOTENE) LIQD 15 mLs by Mouth Rinse route as needed for dry mouth (uses when she brushes her teeth).   busPIRone (BUSPAR) 30 MG tablet Take 1  tablet (30 mg total) by mouth 2 (two) times daily.   clonazePAM (KLONOPIN) 0.5 MG tablet Take 1 tablet (0.5 mg total) by mouth 3 (three) times daily. (Patient taking differently: Take 0.5 mg by mouth 3 (three) times daily. PRN)   clotrimazole-betamethasone (LOTRISONE) cream APPLY TO AFFECTED AREA TOPICALLY 3 TIMES A DAY FOR 3 MONTHS   cycloSPORINE (RESTASIS) 0.05 % ophthalmic emulsion 1 drop 2 (two) times daily.   gabapentin (NEURONTIN) 100 MG capsule TAKE 1 CAPSULE BY MOUTH TWICE A DAY   Omega-3 Fatty Acids (FISH OIL) 1000 MG CAPS Take 1,000 mg by mouth 2 (two) times daily.   omeprazole (PRILOSEC) 40 MG capsule Take 1 capsule (40 mg total) by mouth daily.   PREVIDENT 5000 DRY MOUTH 1.1 % GEL dental gel    sertraline (ZOLOFT) 100 MG tablet TAKE 2 TABLETS BY MOUTH EVERY DAY   traZODone (DESYREL) 150 MG tablet Take 1-2 tablets (150-300 mg total) by mouth daily.   valsartan (DIOVAN) 80 MG tablet Take 80 mg by mouth daily.   [DISCONTINUED] Hypromellose 0.3 % SOLN Apply 1 drop to eye 2 (two) times daily.   [DISCONTINUED] ondansetron (ZOFRAN) 4 MG tablet Take 1 tablet (4 mg total) by mouth every 8 (eight) hours as needed for nausea or vomiting.   No facility-administered encounter medications on file as of 04/14/2023.    Allergies (verified) Morphine  History: Past Medical History:  Diagnosis Date   ADD (attention deficit disorder)    Allergy    Anemia, normocytic normochromic 12/25/2012   Mild, chronic, Hb 11-12 grams at least since 11/2006; screening studies & endoscopies unremarkable   Anxiety    Back pain    Breast cancer (HCC) 2007   Chronic kidney disease    hx of stage 3 kidney disease    Depression    Diverticulosis of colon (without mention of hemorrhage)    Esophageal reflux    H/O dehydration    Headache    PMH   Heart murmur    History of blood transfusion    History of radiation therapy    Hypercholesterolemia    Hypertension    Irritable bowel syndrome    Malignant  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) 12/24/2013   Osteopenia    Ovarian cystic mass 07/07/2015   Personal history of radiation therapy 2008   PONV (postoperative nausea and vomiting)    also had severe itching    Pyloric stenosis    Repeated falls    Shortness of breath dyspnea    walking distances or climbing stairs   Past Surgical History:  Procedure Laterality Date   APPENDECTOMY  2007   perforated appendicitis   BREAST LUMPECTOMY  2007   right   COLONOSCOPY     COLONOSCOPY WITH PROPOFOL N/A 07/07/2015   Procedure: COLONOSCOPY WITH PROPOFOL;  Surgeon: Iva Boop, MD;  Location: [facility] ENDOSCOPY;  Service: Endoscopy;  Laterality: N/A;  NEED ULTRA SLIM COLONOSCOPE PLEASE, possible pyloric dilation   ESOPHAGOGASTRODUODENOSCOPY     ESOPHAGOGASTRODUODENOSCOPY N/A 07/07/2015   Procedure: ESOPHAGOGASTRODUODENOSCOPY (EGD);  Surgeon: Iva Boop, MD;  Location: [facility] ENDOSCOPY;  Service: Endoscopy;  Laterality: N/A;  POSSIBLE PYLORIC DILATION   HERNIA REPAIR Left    L and ventral   LAPAROSCOPIC BILATERAL SALPINGO OOPHERECTOMY Bilateral 10/12/2015   Procedure: LAPAROSCOPIC  LEFT SALPINGO OOPHORECTOMY AND LYSIS OF ADHESIONS ;  Surgeon: Adolphus Birchwood, MD;  Location: [facility] ORS;  Service: Gynecology;  Laterality: Bilateral;   LAPAROSCOPIC LYSIS OF ADHESIONS  10/29/2012   Procedure: LAPAROSCOPIC LYSIS OF ADHESIONS;  Surgeon: Valarie Merino, MD;  Location: [facility] ORS;  Service: General;;   UPPER GASTROINTESTINAL ENDOSCOPY  Multiple   UTERINE FIBROID SURGERY     with subsequent scar tissue removal   VENTRAL HERNIA REPAIR  10/29/2012   Procedure: LAPAROSCOPIC VENTRAL HERNIA;  Surgeon: Valarie Merino, MD;  Location: [facility] ORS;  Service: General;  Laterality: N/A;  LAPRASCOPIC  ASSISTED OPEN VENTRAL HERNIA REPAIR, LAPRASCOPIC LYSIS OF ADHESIONS    VENTRAL HERNIA REPAIR N/A 10/12/2015   Procedure: LAPAROSCOPIC VENTRAL HERNIA REPAIR ;  Surgeon: Luretha Murphy, MD;  Location: [facility] ORS;   Service: General;  Laterality: N/A;   WISDOM TOOTH EXTRACTION     Family History  Problem Relation Age of Onset   Miscarriages / [place] Mother    Hearing loss Father    Arthritis Father    Other Father        colon rupture   Diabetes Maternal Aunt    Breast cancer Maternal Aunt    Breast cancer Maternal Aunt        X 3   Breast cancer Maternal Aunt    Cancer Maternal Grandfather    Prostate cancer Maternal Grandfather    Arthritis Paternal Grandmother    Early death Paternal Grandfather    Drug abuse Paternal Grandfather    Pancreatic cancer Cousin  Social History   Socioeconomic History   Marital status: Divorced    Spouse name: Not on file   Number of children: 0   Years of education: Not on file   Highest education level: Not on file  Occupational History   Occupation: Retired Magazine features editor: RETIRED  Tobacco Use   Smoking status: Never   Smokeless tobacco: Never  Vaping Use   Vaping Use: Never used  Substance and Sexual Activity   Alcohol use: Yes    Alcohol/week: 0.0 standard drinks of alcohol    Comment: rarely   Drug use: No   Sexual activity: Not Currently  Other Topics Concern   Not on file  Social History Narrative   She is divorced. No children. She is a retired Insurance account manager.  infertility   Lives alone.   One caffeinated beverage daily.      Social Determinants of Health   Financial Resource Strain: Low Risk  (04/14/2023)   Overall Financial Resource Strain (CARDIA)    Difficulty of Paying Living Expenses: Not hard at all  Food Insecurity: No Food Insecurity (04/14/2023)   Hunger Vital Sign    Worried About Running Out of Food in the Last Year: Never true    Ran Out of Food in the Last Year: Never true  Transportation Needs: No Transportation Needs (04/14/2023)   PRAPARE - Administrator, Civil Service (Medical): No    Lack of Transportation (Non-Medical): No  Physical Activity: Inactive (04/14/2023)   Exercise  Vital Sign    Days of Exercise per Week: 0 days    Minutes of Exercise per Session: 0 min  Stress: No Stress Concern Present (04/14/2023)   [employer] of Occupational Health - Occupational Stress Questionnaire    Feeling of Stress : Only a little  Social Connections: Moderately Isolated (04/14/2023)   Social Connection and Isolation Panel [NHANES]    Frequency of Communication with Friends and Family: More than three times a week    Frequency of Social Gatherings with Friends and Family: Once a week    Attends Religious Services: 1 to 4 times per year    Active Member of [employer] or Organizations: No    Attends Engineer, structural: Never    Marital Status: Divorced    Tobacco Counseling Counseling given: Not Answered   Clinical Intake:  Pre-visit preparation completed: Yes  Pain : No/denies pain     BMI - recorded: 26.83 Nutritional Status: BMI 25 -29 Overweight Nutritional Risks: None Diabetes: No  How often do you need to have someone help you when you read instructions, pamphlets, or other written materials from your doctor or pharmacy?: 1 - Never  Diabetic?no  Interpreter Needed?: No  Information entered by :: Lanier Ensign, LPN   Activities of Daily Living    04/14/2023    2:45 PM  In your present state of health, do you have any difficulty performing the following activities:  Hearing? 0  Vision? 0  Difficulty concentrating or making decisions? 0  Walking or climbing stairs? 0  Dressing or bathing? 0  Doing errands, shopping? 0  Preparing Food and eating ? N  Using the Toilet? N  In the past six months, have you accidently leaked urine? Y  Comment wears brief  Do you have problems with loss of bowel control? N  Managing your Medications? N  Managing your Finances? N  Housekeeping or managing your Housekeeping? N    Patient Care Team:  Jeani Sow, MD as PCP - General (Family Medicine) Ccs, Md, MD (General Surgery)  Indicate any  recent Medical Services you may have received from other than [facility] providers in the past year (date may be approximate).     Assessment:   This is a routine wellness examination for Dustin.  Hearing/Vision screen Hearing Screening - Comments:: Pt denies any hearing  Vision Screening - Comments:: Pt follows up with [facility] for annual eye exam   Dietary issues and exercise activities discussed: Current Exercise Habits: The patient does not participate in regular exercise at present   Goals Addressed             This Visit's Progress    Patient Stated       Stay healthy        Depression Screen    04/14/2023    2:41 PM 04/08/2023    2:00 PM 11/05/2022   10:53 AM  PHQ 2/9 Scores  PHQ - 2 Score 2 2 2   PHQ- 9 Score 3 4 7     Fall Risk    04/14/2023    2:44 PM 04/08/2023    1:59 PM 11/05/2022   10:53 AM  Fall Risk   Falls in the past year? 1 1 1   Number falls in past yr: 1 0 1  Injury with Fall? 1 1 1   Comment two black eye    Risk for fall due to : Impaired balance/gait;Impaired vision;Impaired mobility Impaired balance/gait Impaired balance/gait  Follow up Falls prevention discussed Falls prevention discussed Falls prevention discussed;Education provided    FALL RISK PREVENTION PERTAINING TO THE HOME:  Any stairs in or around the home? Yes  If so, are there any without handrails? No  Home free of loose throw rugs in walkways, pet beds, electrical cords, etc? Yes  Adequate lighting in your home to reduce risk of falls? Yes   ASSISTIVE DEVICES UTILIZED TO PREVENT FALLS:  Life alert? No  Use of a cane, walker or w/c? Yes  Grab bars in the bathroom? Yes  Shower chair or bench in shower? Yes  Elevated toilet seat or a handicapped toilet? No   TIMED UP AND GO:  Was the test performed? No .  Cognitive Function:        04/14/2023    2:46 PM  6CIT Screen  What Year? 0 points  What month? 0 points  What time? 0 points  Count back from 20 0 points  Months  in reverse 0 points  Repeat phrase 0 points  Total Score 0 points    Immunizations Immunization History  Administered Date(s) Administered   Moderna Sars-Covid-2 Vaccination 01/08/2020, 02/05/2020, 10/25/2022    TDAP status: Due, Education has been provided regarding the importance of this vaccine. Advised may receive this vaccine at local pharmacy or Health Dept. Aware to provide a copy of the vaccination record if obtained from local pharmacy or Health Dept. Verbalized acceptance and understanding.  Flu Vaccine status: Declined, Education has been provided regarding the importance of this vaccine but patient still declined. Advised may receive this vaccine at local pharmacy or Health Dept. Aware to provide a copy of the vaccination record if obtained from local pharmacy or Health Dept. Verbalized acceptance and understanding.  Pneumococcal vaccine status: Declined,  Education has been provided regarding the importance of this vaccine but patient still declined. Advised may receive this vaccine at local pharmacy or Health Dept. Aware to provide a copy of the vaccination record if obtained  from local pharmacy or Health Dept. Verbalized acceptance and understanding.   Covid-19 vaccine status: Completed vaccines  Qualifies for Shingles Vaccine? Yes   Zostavax completed No   Shingrix Completed?: No.    Education has been provided regarding the importance of this vaccine. Patient has been advised to call insurance company to determine out of pocket expense if they have not yet received this vaccine. Advised may also receive vaccine at local pharmacy or Health Dept. Verbalized acceptance and understanding.  Screening Tests Health Maintenance  Topic Date Due   Hepatitis C Screening  Never done   DTaP/Tdap/Td (1 - Tdap) Never done   Pneumonia Vaccine 1+ Years old (1 of 1 - PCV) 07/07/2023 (Originally 09/05/2012)   COVID-19 Vaccine (4 - 2023-24 season) 07/22/2023 (Originally 12/20/2022)    Zoster Vaccines- Shingrix (1 of 2) 11/05/2023 (Originally 09/05/1965)   INFLUENZA VACCINE  06/26/2023   Medicare Annual Wellness (AWV)  04/13/2024   COLONOSCOPY (Pts 45-87yrs Insurance coverage will need to be confirmed)  07/06/2025   DEXA SCAN  Completed   HPV VACCINES  Aged Out    Health Maintenance  Health Maintenance Due  Topic Date Due   Hepatitis C Screening  Never done   DTaP/Tdap/Td (1 - Tdap) Never done    Colorectal cancer screening: Type of screening: Colonoscopy. Completed 07/07/15. Repeat every 10 years  Mammogram status: Completed 12/16/22. Repeat every year  Bone Density status: Completed 11/27/12. Results reflect: Bone density results: OSTEOPENIA. Repeat every 2 years.   Additional Screening:  Hepatitis C Screening: does qualify  Vision Screening: Recommended annual ophthalmology exams for early detection of glaucoma and other disorders of the eye. Is the patient up to date with their annual eye exam?  Yes  Who is the provider or what is the name of the office in which the patient attends annual eye exams? [facility]  If pt is not established with a provider, would they like to be referred to a provider to establish care? No .   Dental Screening: Recommended annual dental exams for proper oral hygiene  Community Resource Referral / Chronic Care Management: CRR required this visit?  No   CCM required this visit?  No      Plan:     I have personally reviewed and noted the following in the patient's chart:   Medical and social history Use of alcohol, tobacco or illicit drugs  Current medications and supplements including opioid prescriptions. Patient is not currently taking opioid prescriptions. Functional ability and status Nutritional status Physical activity Advanced directives List of other physicians Hospitalizations, surgeries, and ER visits in previous 12 months Vitals Screenings to include cognitive, depression, and falls Referrals and  appointments  In addition, I have reviewed and discussed with patient certain preventive protocols, quality metrics, and best practice recommendations. A written personalized care plan for preventive services as well as general preventive health recommendations were provided to patient.     Marzella Schlein, LPN   03/03/8118   Nurse Notes: none

## 2023-04-14 NOTE — Patient Instructions (Signed)
Gloria Fields , Thank you for taking time to come for your Medicare Wellness Visit. I appreciate your ongoing commitment to your health goals. Please review the following plan we discussed and let me know if I can assist you in the future.   These are the goals we discussed:  Goals      Patient Stated     Stay healthy         This is a list of the screening recommended for you and due dates:  Health Maintenance  Topic Date Due   Hepatitis C Screening: USPSTF Recommendation to screen - Ages 54-79 yo.  Never done   DTaP/Tdap/Td vaccine (1 - Tdap) Never done   Pneumonia Vaccine (1 of 1 - PCV) 07/07/2023*   COVID-19 Vaccine (4 - 2023-24 season) 07/22/2023*   Zoster (Shingles) Vaccine (1 of 2) 11/05/2023*   Flu Shot  06/26/2023   Medicare Annual Wellness Visit  04/13/2024   Colon Cancer Screening  07/06/2025   DEXA scan (bone density measurement)  Completed   HPV Vaccine  Aged Out  *Topic was postponed. The date shown is not the original due date.    Advanced directives: Please bring a copy of your health care power of attorney and living will to the office at your convenience.  Conditions/risks identified: stay healthy   Next appointment: Follow up in one year for your annual wellness visit    Preventive Care 65 Years and Older, Female Preventive care refers to lifestyle choices and visits with your health care provider that can promote health and wellness. What does preventive care include? A yearly physical exam. This is also called an annual well check. Dental exams once or twice a year. Routine eye exams. Ask your health care provider how often you should have your eyes checked. Personal lifestyle choices, including: Daily care of your teeth and gums. Regular physical activity. Eating a healthy diet. Avoiding tobacco and drug use. Limiting alcohol use. Practicing safe sex. Taking low-dose aspirin every day. Taking vitamin and mineral supplements as recommended by your  health care provider. What happens during an annual well check? The services and screenings done by your health care provider during your annual well check will depend on your age, overall health, lifestyle risk factors, and family history of disease. Counseling  Your health care provider may ask you questions about your: Alcohol use. Tobacco use. Drug use. Emotional well-being. Home and relationship well-being. Sexual activity. Eating habits. History of falls. Memory and ability to understand (cognition). Work and work Astronomer. Reproductive health. Screening  You may have the following tests or measurements: Height, weight, and BMI. Blood pressure. Lipid and cholesterol levels. These may be checked every 5 years, or more frequently if you are over 14 years old. Skin check. Lung cancer screening. You may have this screening every year starting at age 40 if you have a 30-pack-year history of smoking and currently smoke or have quit within the past 15 years. Fecal occult blood test (FOBT) of the stool. You may have this test every year starting at age 86. Flexible sigmoidoscopy or colonoscopy. You may have a sigmoidoscopy every 5 years or a colonoscopy every 10 years starting at age 62. Hepatitis C blood test. Hepatitis B blood test. Sexually transmitted disease (STD) testing. Diabetes screening. This is done by checking your blood sugar (glucose) after you have not eaten for a while (fasting). You may have this done every 1-3 years. Bone density scan. This is done to screen for  osteoporosis. You may have this done starting at age 54. Mammogram. This may be done every 1-2 years. Talk to your health care provider about how often you should have regular mammograms. Talk with your health care provider about your test results, treatment options, and if necessary, the need for more tests. Vaccines  Your health care provider may recommend certain vaccines, such as: Influenza vaccine.  This is recommended every year. Tetanus, diphtheria, and acellular pertussis (Tdap, Td) vaccine. You may need a Td booster every 10 years. Zoster vaccine. You may need this after age 42. Pneumococcal 13-valent conjugate (PCV13) vaccine. One dose is recommended after age 41. Pneumococcal polysaccharide (PPSV23) vaccine. One dose is recommended after age 2. Talk to your health care provider about which screenings and vaccines you need and how often you need them. This information is not intended to replace advice given to you by your health care provider. Make sure you discuss any questions you have with your health care provider. Document Released: 12/08/2015 Document Revised: 07/31/2016 Document Reviewed: 09/12/2015 Elsevier Interactive Patient Education  2017 [employer].  Fall Prevention in the Home Falls can cause injuries. They can happen to people of all ages. There are many things you can do to make your home safe and to help prevent falls. What can I do on the outside of my home? Regularly fix the edges of walkways and driveways and fix any cracks. Remove anything that might make you trip as you walk through a door, such as a raised step or threshold. Trim any bushes or trees on the path to your home. Use bright outdoor lighting. Clear any walking paths of anything that might make someone trip, such as rocks or tools. Regularly check to see if handrails are loose or broken. Make sure that both sides of any steps have handrails. Any raised decks and porches should have guardrails on the edges. Have any leaves, snow, or ice cleared regularly. Use sand or salt on walking paths during winter. Clean up any spills in your garage right away. This includes oil or grease spills. What can I do in the bathroom? Use night lights. Install grab bars by the toilet and in the tub and shower. Do not use towel bars as grab bars. Use non-skid mats or decals in the tub or shower. If you need to sit  down in the shower, use a plastic, non-slip stool. Keep the floor dry. Clean up any water that spills on the floor as soon as it happens. Remove soap buildup in the tub or shower regularly. Attach bath mats securely with double-sided non-slip rug tape. Do not have throw rugs and other things on the floor that can make you trip. What can I do in the bedroom? Use night lights. Make sure that you have a light by your bed that is easy to reach. Do not use any sheets or blankets that are too big for your bed. They should not hang down onto the floor. Have a firm chair that has side arms. You can use this for support while you get dressed. Do not have throw rugs and other things on the floor that can make you trip. What can I do in the kitchen? Clean up any spills right away. Avoid walking on wet floors. Keep items that you use a lot in easy-to-reach places. If you need to reach something above you, use a strong step stool that has a grab bar. Keep electrical cords out of the way. Do not  use floor polish or wax that makes floors slippery. If you must use wax, use non-skid floor wax. Do not have throw rugs and other things on the floor that can make you trip. What can I do with my stairs? Do not leave any items on the stairs. Make sure that there are handrails on both sides of the stairs and use them. Fix handrails that are broken or loose. Make sure that handrails are as long as the stairways. Check any carpeting to make sure that it is firmly attached to the stairs. Fix any carpet that is loose or worn. Avoid having throw rugs at the top or bottom of the stairs. If you do have throw rugs, attach them to the floor with carpet tape. Make sure that you have a light switch at the top of the stairs and the bottom of the stairs. If you do not have them, ask someone to add them for you. What else can I do to help prevent falls? Wear shoes that: Do not have high heels. Have rubber bottoms. Are  comfortable and fit you well. Are closed at the toe. Do not wear sandals. If you use a stepladder: Make sure that it is fully opened. Do not climb a closed stepladder. Make sure that both sides of the stepladder are locked into place. Ask someone to hold it for you, if possible. Clearly mark and make sure that you can see: Any grab bars or handrails. First and last steps. Where the edge of each step is. Use tools that help you move around (mobility aids) if they are needed. These include: Canes. Walkers. Scooters. Crutches. Turn on the lights when you go into a dark area. Replace any light bulbs as soon as they burn out. Set up your furniture so you have a clear path. Avoid moving your furniture around. If any of your floors are uneven, fix them. If there are any pets around you, be aware of where they are. Review your medicines with your doctor. Some medicines can make you feel dizzy. This can increase your chance of falling. Ask your doctor what other things that you can do to help prevent falls. This information is not intended to replace advice given to you by your health care provider. Make sure you discuss any questions you have with your health care provider. Document Released: 09/07/2009 Document Revised: 04/18/2016 Document Reviewed: 12/16/2014 Elsevier Interactive Patient Education  2017 [employer].

## 2023-04-15 ENCOUNTER — Other Ambulatory Visit: Payer: Self-pay | Admitting: Family Medicine

## 2023-04-16 ENCOUNTER — Telehealth: Payer: Self-pay | Admitting: Psychiatry

## 2023-04-16 ENCOUNTER — Other Ambulatory Visit: Payer: Self-pay

## 2023-04-16 ENCOUNTER — Other Ambulatory Visit: Payer: Self-pay | Admitting: Psychiatry

## 2023-04-16 DIAGNOSIS — F988 Other specified behavioral and emotional disorders with onset usually occurring in childhood and adolescence: Secondary | ICD-10-CM

## 2023-04-16 NOTE — Telephone Encounter (Signed)
Pt called requesting Rx for generic adderall 10 mg 3/d HT [place]. Apt 5/23

## 2023-04-16 NOTE — Telephone Encounter (Signed)
Patient lvm at 11:33 stating that she checked with pharmacy and they do have the Adderall in stock at [facility] [address] [place]

## 2023-04-16 NOTE — Telephone Encounter (Signed)
No refill until seen

## 2023-04-16 NOTE — Telephone Encounter (Signed)
Pended. Has an appt tomorrow.

## 2023-04-17 ENCOUNTER — Encounter: Payer: Self-pay | Admitting: Psychiatry

## 2023-04-17 ENCOUNTER — Ambulatory Visit (INDEPENDENT_AMBULATORY_CARE_PROVIDER_SITE_OTHER): Payer: Medicare PPO | Admitting: Psychiatry

## 2023-04-17 DIAGNOSIS — F902 Attention-deficit hyperactivity disorder, combined type: Secondary | ICD-10-CM

## 2023-04-17 DIAGNOSIS — F411 Generalized anxiety disorder: Secondary | ICD-10-CM | POA: Diagnosis not present

## 2023-04-17 DIAGNOSIS — F5105 Insomnia due to other mental disorder: Secondary | ICD-10-CM

## 2023-04-17 DIAGNOSIS — F988 Other specified behavioral and emotional disorders with onset usually occurring in childhood and adolescence: Secondary | ICD-10-CM

## 2023-04-17 DIAGNOSIS — F3342 Major depressive disorder, recurrent, in full remission: Secondary | ICD-10-CM

## 2023-04-17 DIAGNOSIS — F423 Hoarding disorder: Secondary | ICD-10-CM

## 2023-04-17 DIAGNOSIS — F4001 Agoraphobia with panic disorder: Secondary | ICD-10-CM | POA: Diagnosis not present

## 2023-04-17 MED ORDER — TRAZODONE HCL 150 MG PO TABS
150.0000 mg | ORAL_TABLET | Freq: Every day | ORAL | 1 refills | Status: DC
Start: 2023-04-17 — End: 2023-10-20

## 2023-04-17 MED ORDER — BUSPIRONE HCL 30 MG PO TABS
30.0000 mg | ORAL_TABLET | Freq: Two times a day (BID) | ORAL | 1 refills | Status: DC
Start: 2023-04-17 — End: 2024-04-20

## 2023-04-17 MED ORDER — AMPHETAMINE-DEXTROAMPHETAMINE 10 MG PO TABS
10.0000 mg | ORAL_TABLET | Freq: Three times a day (TID) | ORAL | 0 refills | Status: DC
Start: 2023-04-17 — End: 2023-08-01

## 2023-04-17 NOTE — Progress Notes (Signed)
Gloria Fields [id number] Mar 21, 1946 77 y.o.  Subjective:   Patient ID:  Gloria Fields is a 77 y.o. (DOB 1946-11-12) female.  Chief Complaint:  Chief Complaint  Patient presents with   Follow-up    Anxiety Symptoms include nervous/anxious behavior. Patient reports no palpitations.    Medication Refill Associated symptoms include arthralgias. Pertinent negatives include no abdominal pain or weakness.  Depression        Past medical history includes anxiety.    Gloria Fields presents to the office today for follow-up of depression anxiety, ADD, sleep.  seen in September 2019.  Trazodone was increased to 150 mg nightly for sleep and depression.  She was also given hydroxyzine 25 mg 3 times daily for anxiety.  seen June 2020 without med changes.  On Adderall 10 mg twice daily, buspirone 30 mg twice daily, clonazepam 0.5 mg twice daily, gabapentin 100 mg twice daily, hydroxyzine 25 3 times daily as needed, sertraline 200, trazodone 150-300 nightly for sleep  05/09/20 appt with following noted: Tremendous anxiety about leaving the house and needs clonazepam.  Uses prn. Average one daily or for sleep. Asks to increase Adderall to TID.  Wears off at 3 pm and can't do much.   Wants to clean out her house and be more productive.  Getting things done with Adderall in morning. House looks like hoarder house LT.   No kids, no husband.  Just ended 59 year rel with cruel man with PD.  He lost job house and car sending money to [place] woman.   Residual depression, anxiety, sleep problems with erratic schedule.  Procrastinates. Tolerates meds. Plan: OK increase Adderall to 10 TID to prolong productivity period which could also help mood.  11/08/2020 appointment with the following noted: Increased Adderall TID helped productivity.  Organizes closests but there's stuff all over the house and working on it.   Clonazepam variable.  Needs it to go out places. But some days  don't take it. Still takes sertraline 200 and trazodone 150-300.   Sleep is OK.  Occ NM less often. 5 cats and a dog. Some depression but manageable.  Anxiety episodic. Tolerating meds.  Ongoing GI problems.  Takes antibiotic for it with benefit. Plan:  OK increase Adderall to 10 TID to prolong productivity period which could also help mood.  05/10/21 appt noted: Longer duration with Adderall 10 TID bu tstill procrastinates. Don't do much.  Can't stand the heat.  Some yard work Not crying or marked depression.  Not sad.  Has interests. Sleep is good. Anxierty with losing things or crowds or rushed. Clonazepam to go out. Not daily. Plan: No med changes, continue sertraline 200 mg and Adderall 10 mg 3 times daily, trazodone 300 mg HS, Buspirone 30 BID, clonazepam 0.5 mg prn  01/28/2022 appointment with the following noted: Average clonazepam up to 2 daily and some days none. Allergy problems. Some ups and downs since here but overall OK. Consistent with meds without problems. No SE 1-2 panic since here. Clonazepam helps. Sleep good with trazodone 10 hours.  Like to have my sleep. Lonely around holidays. Plan: no med changes. Trazodone helps sleep. 300 HS Continue sertraline 200 mg daily. Buspirone 30 BID  Adderall 10 TID  Continue prn clonazepam.  07/31/22 appt noted: late bc wrong turn Overall ok with good days and bad days.only 1-2 days down at time.  Hard to tell.  Anxiety will make head sweating worse and always had this.  Hard on herself.  Still in cluttered mess.  Enjoys cats and dog. Stay home so don't go buy things and add to clutter. Consistent with meds. No med concerns. No SE with meds. Occ panic.  Last in a couple of weeks and manages with breathing techniques. Married 3 times. Plan: Trazodone helps sleep. 300 HS Continue sertraline 200 mg daily. Buspirone 30 BID  Adderall 10 TID  Continue prn clonazepam.  04/17/23 appt noted: Meds as above. No SE and consistent  with meds. Been doing ok.  Been through a hermit stage which doesn't bother her but getting out more.   Anxiety not good and driven by clutter Stressed with house.  Anxiety can make her unsteady.   Found new PCP Not buying new things but hard to get rid of things. Dep less of a problem, not that unsatisfied.  But anxiety can interfere with her function  Past Psychiatric Medication Trials: Patient reportedly been on Adderall for about 25 years.   Clonazepam 0.5, lorazepam buspirone 30 twice daily, sertraline 200,  Wellbutrin, citalopram, trazodone, gabapentin, hydroxyzine, Lunesta,  olanzapine, Abilify, Saphris,   No sig therapy for hoarding.  Review of Systems:  Review of Systems  Cardiovascular:  Negative for palpitations.  Gastrointestinal:  Negative for abdominal pain and diarrhea.  Musculoskeletal:  Positive for arthralgias.  Neurological:  Negative for tremors and weakness.  Psychiatric/Behavioral:  The patient is nervous/anxious.   GI problems diet related in part  Medications: I have reviewed the patient's current medications.  Current Outpatient Medications  Medication Sig Dispense Refill   amphetamine-dextroamphetamine (ADDERALL) 10 MG tablet Take 1 tablet (10 mg total) by mouth 3 (three) times daily. 270 tablet 0   antiseptic oral rinse (BIOTENE) LIQD 15 mLs by Mouth Rinse route as needed for dry mouth (uses when she brushes her teeth).     busPIRone (BUSPAR) 30 MG tablet Take 1 tablet (30 mg total) by mouth 2 (two) times daily. 180 tablet 1   clonazePAM (KLONOPIN) 0.5 MG tablet Take 1 tablet (0.5 mg total) by mouth 3 (three) times daily. (Patient taking differently: Take 0.5 mg by mouth 3 (three) times daily. PRN) 60 tablet 4   clotrimazole-betamethasone (LOTRISONE) cream APPLY TO AFFECTED AREA TOPICALLY 3 TIMES A DAY FOR 3 MONTHS     cycloSPORINE (RESTASIS) 0.05 % ophthalmic emulsion 1 drop 2 (two) times daily.     gabapentin (NEURONTIN) 100 MG capsule TAKE 1 CAPSULE BY  MOUTH TWICE A DAY 180 capsule 0   Omega-3 Fatty Acids (FISH OIL) 1000 MG CAPS Take 1,000 mg by mouth 2 (two) times daily.     omeprazole (PRILOSEC) 40 MG capsule Take 1 capsule (40 mg total) by mouth daily. 90 capsule 1   PREVIDENT 5000 DRY MOUTH 1.1 % GEL dental gel      sertraline (ZOLOFT) 100 MG tablet TAKE 2 TABLETS BY MOUTH EVERY DAY 180 tablet 1   traZODone (DESYREL) 150 MG tablet Take 1-2 tablets (150-300 mg total) by mouth daily. 180 tablet 1   valsartan (DIOVAN) 80 MG tablet Take 80 mg by mouth daily.     Vitamin D, Ergocalciferol, (DRISDOL) 1.25 MG (50000 UNIT) CAPS capsule TAKE 1 CAPSULE (50,000 UNITS TOTAL) BY MOUTH EVERY 7 (SEVEN) DAYS FOR 12 DOSES. 12 capsule 0   No current facility-administered medications for this visit.    Medication Side Effects: None  Allergies:  Allergies  Allergen Reactions   Morphine Other (See Comments)    REACTION: paranoid    Past Medical History:  Diagnosis Date  ADD (attention deficit disorder)    Allergy    Anemia, normocytic normochromic 12/25/2012   Mild, chronic, Hb 11-12 grams at least since 11/2006; screening studies & endoscopies unremarkable   Anxiety    Back pain    Breast cancer (HCC) 2007   Chronic kidney disease    hx of stage 3 kidney disease    Depression    Diverticulosis of colon (without mention of hemorrhage)    Esophageal reflux    H/O dehydration    Headache    PMH   Heart murmur    History of blood transfusion    History of radiation therapy    Hypercholesterolemia    Hypertension    Irritable bowel syndrome    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   Osteopenia    Ovarian cystic mass 07/07/2015   Personal history of radiation therapy 2008   PONV (postoperative nausea and vomiting)    also had severe itching    Pyloric stenosis    Repeated falls    Shortness of breath dyspnea    walking distances or climbing stairs    Family History  Problem Relation Age of  Onset   Miscarriages / [place] Mother    Hearing loss Father    Arthritis Father    Other Father        colon rupture   Diabetes Maternal Aunt    Breast cancer Maternal Aunt    Breast cancer Maternal Aunt        X 3   Breast cancer Maternal Aunt    Cancer Maternal Grandfather    Prostate cancer Maternal Grandfather    Arthritis Paternal Grandmother    Early death Paternal Grandfather    Drug abuse Paternal Grandfather    Pancreatic cancer Cousin     Social History   Socioeconomic History   Marital status: Divorced    Spouse name: Not on file   Number of children: 0   Years of education: Not on file   Highest education level: Not on file  Occupational History   Occupation: Retired Magazine features editor: RETIRED  Tobacco Use   Smoking status: Never   Smokeless tobacco: Never  Vaping Use   Vaping Use: Never used  Substance and Sexual Activity   Alcohol use: Yes    Alcohol/week: 0.0 standard drinks of alcohol    Comment: rarely   Drug use: No   Sexual activity: Not Currently  Other Topics Concern   Not on file  Social History Narrative   She is divorced. No children. She is a retired Insurance account manager.  infertility   Lives alone.   One caffeinated beverage daily.      Social Determinants of Health   Financial Resource Strain: Low Risk  (04/14/2023)   Overall Financial Resource Strain (CARDIA)    Difficulty of Paying Living Expenses: Not hard at all  Food Insecurity: No Food Insecurity (04/14/2023)   Hunger Vital Sign    Worried About Running Out of Food in the Last Year: Never true    Ran Out of Food in the Last Year: Never true  Transportation Needs: No Transportation Needs (04/14/2023)   PRAPARE - Administrator, Civil Service (Medical): No    Lack of Transportation (Non-Medical): No  Physical Activity: Inactive (04/14/2023)   Exercise Vital Sign    Days of Exercise per Week: 0 days    Minutes of Exercise per Session: 0 min  Stress:  No Stress  Concern Present (04/14/2023)   [employer] of Occupational Health - Occupational Stress Questionnaire    Feeling of Stress : Only a little  Social Connections: Moderately Isolated (04/14/2023)   Social Connection and Isolation Panel [NHANES]    Frequency of Communication with Friends and Family: More than three times a week    Frequency of Social Gatherings with Friends and Family: Once a week    Attends Religious Services: 1 to 4 times per year    Active Member of [employer] or Organizations: No    Attends Banker Meetings: Never    Marital Status: Divorced  Catering manager Violence: Not At Risk (04/14/2023)   Humiliation, Afraid, Rape, and Kick questionnaire    Fear of Current or Ex-Partner: No    Emotionally Abused: No    Physically Abused: No    Sexually Abused: No    Past Medical History, Surgical history, Social history, and Family history were reviewed and updated as appropriate.   Lives now with man shes dated for 20 years who moved in due to PD  Please see review of systems for further details on the patient's review from today.   Objective:   Physical Exam:  There were no vitals taken for this visit.  Physical Exam Constitutional:      General: She is not in acute distress.    Appearance: She is well-developed. She is obese.  Musculoskeletal:        General: No deformity.  Neurological:     Mental Status: She is alert and oriented to person, place, and time.     Cranial Nerves: No dysarthria.     Coordination: Coordination normal.  Psychiatric:        Attention and Perception: She is attentive.        Mood and Affect: Mood is anxious. Mood is not depressed. Affect is not labile, blunt, angry, tearful or inappropriate.        Speech: Speech normal. Speech is not rapid and pressured or slurred.        Behavior: Behavior normal.        Thought Content: Thought content normal. Thought content is not delusional. Thought content does not include homicidal  or suicidal ideation. Thought content does not include suicidal plan.        Cognition and Memory: Cognition normal.        Judgment: Judgment normal.     Comments: Insight is fair.    sitational depression generally mild. Residual chronic anxiety     Lab Review:     Component Value Date/Time   NA 141 04/08/2023 1457   NA 142 01/15/2017 0901   K 4.0 04/08/2023 1457   K 4.2 01/15/2017 0901   CL 107 04/08/2023 1457   CO2 27 04/08/2023 1457   CO2 25 01/15/2017 0901   GLUCOSE 104 (H) 04/08/2023 1457   GLUCOSE 100 01/15/2017 0901   BUN 17 04/08/2023 1457   BUN 22.3 01/15/2017 0901   CREATININE 1.37 (H) 04/08/2023 1457   CREATININE 1.3 (H) 01/15/2017 0901   CALCIUM 8.8 04/08/2023 1457   CALCIUM 9.3 01/15/2017 0901   PROT 6.3 04/08/2023 1457   PROT 6.6 01/15/2017 0901   ALBUMIN 3.9 04/08/2023 1457   ALBUMIN 3.6 01/15/2017 0901   AST 15 04/08/2023 1457   AST 14 01/15/2017 0901   ALT 11 04/08/2023 1457   ALT 10 01/15/2017 0901   ALKPHOS 57 04/08/2023 1457   ALKPHOS 65 01/15/2017 0901  BILITOT 0.4 04/08/2023 1457   BILITOT 0.27 01/15/2017 0901   GFRNONAA >60 10/13/2015 0440   GFRAA >60 10/13/2015 0440       Component Value Date/Time   WBC 6.2 04/08/2023 1457   RBC 3.28 (L) 04/08/2023 1457   HGB 10.0 (L) 04/08/2023 1457   HGB 10.1 (L) 01/15/2017 0901   HCT 30.0 (L) 04/08/2023 1457   HCT 31.5 (L) 01/15/2017 0901   PLT 188.0 04/08/2023 1457   PLT 177 01/15/2017 0901   MCV 91.5 04/08/2023 1457   MCV 88.7 01/15/2017 0901   MCH 28.5 01/15/2017 0901   MCH 30.4 10/13/2015 0440   MCHC 33.4 04/08/2023 1457   RDW 14.5 04/08/2023 1457   RDW 14.1 01/15/2017 0901   LYMPHSABS 1.6 04/08/2023 1457   LYMPHSABS 2.4 01/15/2017 0901   MONOABS 0.6 04/08/2023 1457   MONOABS 0.6 01/15/2017 0901   EOSABS 0.2 04/08/2023 1457   EOSABS 0.5 01/15/2017 0901   BASOSABS 0.0 04/08/2023 1457   BASOSABS 0.0 01/15/2017 0901    No results found for: "POCLITH", "LITHIUM"   No results found  for: "PHENYTOIN", "PHENOBARB", "VALPROATE", "CBMZ"   .res Assessment: Plan:    Kemberley was seen today for follow-up.  Diagnoses and all orders for this visit:  Generalized anxiety disorder  Attention deficit hyperactivity disorder (ADHD), combined type  Panic disorder with agoraphobia  Recurrent major depression in full remission (HCC)  Insomnia due to mental condition    30 min face to face time with patient was spent on counseling and coordination of care. We discussed diagnoses to which should be added hoarding.  Struggling with this ongoing producing a good deal of anxiety..  We discussed the short-term risks associated with benzodiazepines including sedation and increased fall risk among others.  Discussed long-term side effect risk including dependence, potential withdrawal symptoms, and the potential eventual dose-related risk of dementia.  But recent studies from 2020 dispute this association between benzodiazepines and dementia risk. Newer studies in 2020 do not support an association with dementia.  Not ideal to take stimulants and clonazepam but needs BZ to leave the house.  Fearful.  Adderall helps function. OK  Adderall 10 TID to prolong productivity period which could also help mood. Some avoidance.  Discussed potential benefits, risks, and side effects of stimulants with patient to include increased heart rate, palpitations, insomnia, increased anxiety, increased irritability, or decreased appetite.  Instructed patient to contact office if experiencing any significant tolerability issues.  16 min counseling; Supportive therapy dealing with broken relationship and loneliness.  Disc hoarding and pushing herself through procrastination and hoarding.  She is making some progress since here. Encourage activity and return to [employer]. Encouraget consult with downsizing consultant of if that does not work then a therpist with expertise hoarding.  No med changes  indicated.   Trazodone helps sleep. 300 HS Continue sertraline 200 mg daily. Buspirone 30 BID  Adderall 10 TID  Continue prn clonazepam.  Benefit and tolerating meds.  FU 6 mos  Meredith Staggers, MD, DFAPA   Please see After Visit Summary for patient specific instructions.  Future Appointments  Date Time Provider Department Center  06/06/2023 11:30 AM [facility]    No orders of the defined types were placed in this encounter.    -------------------------------

## 2023-04-30 ENCOUNTER — Other Ambulatory Visit: Payer: Self-pay | Admitting: Psychiatry

## 2023-04-30 ENCOUNTER — Telehealth: Payer: Self-pay | Admitting: Psychiatry

## 2023-04-30 DIAGNOSIS — F4001 Agoraphobia with panic disorder: Secondary | ICD-10-CM

## 2023-04-30 MED ORDER — CLONAZEPAM 0.5 MG PO TABS
0.5000 mg | ORAL_TABLET | Freq: Three times a day (TID) | ORAL | 2 refills | Status: DC | PRN
Start: 2023-04-30 — End: 2023-09-25

## 2023-04-30 NOTE — Telephone Encounter (Signed)
Pt is requesting a refill on her klonopin. [facility] [address] or [facility] on [address]

## 2023-04-30 NOTE — Telephone Encounter (Signed)
Sent [facility]

## 2023-06-06 ENCOUNTER — Other Ambulatory Visit (INDEPENDENT_AMBULATORY_CARE_PROVIDER_SITE_OTHER): Payer: Medicare PPO

## 2023-06-06 DIAGNOSIS — R7989 Other specified abnormal findings of blood chemistry: Secondary | ICD-10-CM | POA: Diagnosis not present

## 2023-06-06 LAB — BASIC METABOLIC PANEL
BUN: 27 mg/dL — ABNORMAL HIGH (ref 6–23)
CO2: 27 mEq/L (ref 19–32)
Calcium: 9.5 mg/dL (ref 8.4–10.5)
Chloride: 105 mEq/L (ref 96–112)
Creatinine, Ser: 1.51 mg/dL — ABNORMAL HIGH (ref 0.40–1.20)
GFR: 33.55 mL/min — ABNORMAL LOW (ref >60.00)
Glucose, Bld: 125 mg/dL — ABNORMAL HIGH (ref 70–99)
Potassium: 4 mEq/L (ref 3.5–5.1)
Sodium: 141 mEq/L (ref 135–145)

## 2023-06-08 NOTE — Progress Notes (Signed)
Kidney function a little worse and looks like some dehydration.  Is she drinking a lot of water?  Needs to.  Repeat bmp and send out UA in 1 month.

## 2023-06-09 ENCOUNTER — Other Ambulatory Visit: Payer: Self-pay | Admitting: *Deleted

## 2023-06-09 DIAGNOSIS — N289 Disorder of kidney and ureter, unspecified: Secondary | ICD-10-CM

## 2023-06-10 ENCOUNTER — Telehealth: Payer: Self-pay | Admitting: Family Medicine

## 2023-06-10 NOTE — Telephone Encounter (Signed)
Patient contacted and given lab results and recommendations. Patient stated understanding and did not have any questions or concerns at this time.  ? ?

## 2023-06-10 NOTE — Telephone Encounter (Signed)
Patient called stating that she was returning Gloria Fields's call in regards to her labs.

## 2023-07-04 ENCOUNTER — Other Ambulatory Visit: Payer: Self-pay | Admitting: Family Medicine

## 2023-07-07 DIAGNOSIS — H524 Presbyopia: Secondary | ICD-10-CM | POA: Diagnosis not present

## 2023-07-07 DIAGNOSIS — H52223 Regular astigmatism, bilateral: Secondary | ICD-10-CM | POA: Diagnosis not present

## 2023-07-07 DIAGNOSIS — H5203 Hypermetropia, bilateral: Secondary | ICD-10-CM | POA: Diagnosis not present

## 2023-07-11 ENCOUNTER — Other Ambulatory Visit: Payer: Medicare PPO

## 2023-07-11 DIAGNOSIS — N289 Disorder of kidney and ureter, unspecified: Secondary | ICD-10-CM

## 2023-07-11 LAB — URINALYSIS, ROUTINE W REFLEX MICROSCOPIC
Bilirubin Urine: NEGATIVE
Hgb urine dipstick: NEGATIVE
Ketones, ur: NEGATIVE
Leukocytes,Ua: NEGATIVE
Nitrite: NEGATIVE
Specific Gravity, Urine: 1.025 (ref 1.000–1.030)
Total Protein, Urine: NEGATIVE
Urine Glucose: NEGATIVE
Urobilinogen, UA: 0.2 (ref 0.0–1.0)
pH: 6 (ref 5.0–8.0)

## 2023-07-11 LAB — BASIC METABOLIC PANEL
BUN: 27 mg/dL — ABNORMAL HIGH (ref 6–23)
CO2: 23 mEq/L (ref 19–32)
Calcium: 9.6 mg/dL (ref 8.4–10.5)
Chloride: 104 mEq/L (ref 96–112)
Creatinine, Ser: 1.4 mg/dL — ABNORMAL HIGH (ref 0.40–1.20)
GFR: 36.72 mL/min — ABNORMAL LOW (ref >60.00)
Glucose, Bld: 113 mg/dL — ABNORMAL HIGH (ref 70–99)
Potassium: 3.9 mEq/L (ref 3.5–5.1)
Sodium: 135 mEq/L (ref 135–145)

## 2023-07-11 NOTE — Progress Notes (Signed)
Stable.  Just needs to make sure drinking 8 glasses of water daily.  Avoid nsaids.  Sch f/u appt in November for HTN/recheck and will repeat labs then

## 2023-08-01 ENCOUNTER — Other Ambulatory Visit: Payer: Self-pay

## 2023-08-01 ENCOUNTER — Telehealth: Payer: Self-pay | Admitting: Psychiatry

## 2023-08-01 DIAGNOSIS — F3342 Major depressive disorder, recurrent, in full remission: Secondary | ICD-10-CM

## 2023-08-01 DIAGNOSIS — F988 Other specified behavioral and emotional disorders with onset usually occurring in childhood and adolescence: Secondary | ICD-10-CM

## 2023-08-01 DIAGNOSIS — F411 Generalized anxiety disorder: Secondary | ICD-10-CM

## 2023-08-01 DIAGNOSIS — F4001 Agoraphobia with panic disorder: Secondary | ICD-10-CM

## 2023-08-01 MED ORDER — AMPHETAMINE-DEXTROAMPHETAMINE 10 MG PO TABS
10.0000 mg | ORAL_TABLET | Freq: Three times a day (TID) | ORAL | 0 refills | Status: DC
Start: 2023-08-01 — End: 2023-10-20

## 2023-08-01 MED ORDER — SERTRALINE HCL 100 MG PO TABS
200.0000 mg | ORAL_TABLET | Freq: Every day | ORAL | 0 refills | Status: DC
Start: 2023-08-01 — End: 2023-10-13

## 2023-08-01 NOTE — Telephone Encounter (Signed)
Sent/pended 

## 2023-08-01 NOTE — Telephone Encounter (Signed)
Patient called in for refill on Adderall 10mg  and Sertraline 200mg . States they have in stock. Ph: [phone] Appt 11/25 [facility] [address] [place]

## 2023-09-25 ENCOUNTER — Other Ambulatory Visit: Payer: Self-pay

## 2023-09-25 ENCOUNTER — Telehealth: Payer: Self-pay | Admitting: Psychiatry

## 2023-09-25 DIAGNOSIS — F4001 Agoraphobia with panic disorder: Secondary | ICD-10-CM

## 2023-09-25 MED ORDER — CLONAZEPAM 0.5 MG PO TABS
0.5000 mg | ORAL_TABLET | Freq: Three times a day (TID) | ORAL | 0 refills | Status: DC | PRN
Start: 2023-09-25 — End: 2023-10-20

## 2023-09-25 NOTE — Telephone Encounter (Signed)
PENDED

## 2023-09-25 NOTE — Telephone Encounter (Signed)
Pt LVM @ 9:25a to request Klonopin to [facility] in [place].  Next appt 11/25

## 2023-10-12 ENCOUNTER — Other Ambulatory Visit: Payer: Self-pay | Admitting: Family Medicine

## 2023-10-12 ENCOUNTER — Other Ambulatory Visit: Payer: Self-pay | Admitting: Psychiatry

## 2023-10-12 DIAGNOSIS — F4001 Agoraphobia with panic disorder: Secondary | ICD-10-CM

## 2023-10-12 DIAGNOSIS — F3342 Major depressive disorder, recurrent, in full remission: Secondary | ICD-10-CM

## 2023-10-12 DIAGNOSIS — F411 Generalized anxiety disorder: Secondary | ICD-10-CM

## 2023-10-12 NOTE — Telephone Encounter (Signed)
 Due appt

## 2023-10-13 NOTE — Telephone Encounter (Signed)
LVM informing pt to schedule OV for continuation of refills.

## 2023-10-20 ENCOUNTER — Ambulatory Visit: Payer: Medicare PPO | Admitting: Psychiatry

## 2023-10-20 ENCOUNTER — Encounter: Payer: Self-pay | Admitting: Psychiatry

## 2023-10-20 DIAGNOSIS — F411 Generalized anxiety disorder: Secondary | ICD-10-CM | POA: Diagnosis not present

## 2023-10-20 DIAGNOSIS — F423 Hoarding disorder: Secondary | ICD-10-CM

## 2023-10-20 DIAGNOSIS — F902 Attention-deficit hyperactivity disorder, combined type: Secondary | ICD-10-CM | POA: Diagnosis not present

## 2023-10-20 DIAGNOSIS — F5105 Insomnia due to other mental disorder: Secondary | ICD-10-CM | POA: Diagnosis not present

## 2023-10-20 DIAGNOSIS — F4001 Agoraphobia with panic disorder: Secondary | ICD-10-CM

## 2023-10-20 DIAGNOSIS — F3342 Major depressive disorder, recurrent, in full remission: Secondary | ICD-10-CM

## 2023-10-20 MED ORDER — TRAZODONE HCL 150 MG PO TABS: 150.0000 mg | ORAL_TABLET | Freq: Every day | ORAL | 1 refills | Status: DC

## 2023-10-20 MED ORDER — CLONAZEPAM 0.5 MG PO TABS: 0.5000 mg | ORAL_TABLET | Freq: Three times a day (TID) | ORAL | 5 refills | Status: DC | PRN

## 2023-10-20 MED ORDER — SERTRALINE HCL 100 MG PO TABS: 200.0000 mg | ORAL_TABLET | Freq: Every day | ORAL | 1 refills | Status: DC

## 2023-10-20 MED ORDER — AMPHETAMINE-DEXTROAMPHETAMINE 10 MG PO TABS: 10.0000 mg | ORAL_TABLET | Freq: Three times a day (TID) | ORAL | 0 refills | Status: DC

## 2023-10-20 NOTE — Progress Notes (Signed)
Gloria Fields [id number] 1945/12/22 77 y.o.  Subjective:   Patient ID:  Gloria Fields is a 77 y.o. (DOB 25-Apr-1946) female.  Chief Complaint:  Chief Complaint  Patient presents with   Follow-up   Anxiety   Sleeping Problem   Depression    Anxiety Symptoms include nervous/anxious behavior. Patient reports no palpitations.    Medication Refill Associated symptoms include arthralgias. Pertinent negatives include no abdominal pain or weakness.  Depression        Past medical history includes anxiety.    Gloria Fields presents to the office today for follow-up of depression anxiety, ADD, sleep.  seen in September 2019.  Trazodone was increased to 150 mg nightly for sleep and depression.  She was also given hydroxyzine 25 mg 3 times daily for anxiety.  seen June 2020 without med changes.  On Adderall 10 mg twice daily, buspirone 30 mg twice daily, clonazepam 0.5 mg twice daily, gabapentin 100 mg twice daily, hydroxyzine 25 3 times daily as needed, sertraline 200, trazodone 150-300 nightly for sleep  05/09/20 appt with following noted: Tremendous anxiety about leaving the house and needs clonazepam.  Uses prn. Average one daily or for sleep. Asks to increase Adderall to TID.  Wears off at 3 pm and can't do much.   Wants to clean out her house and be more productive.  Getting things done with Adderall in morning. House looks like hoarder house LT.   No kids, no husband.  Just ended 60 year rel with cruel man with PD.  He lost job house and car sending money to overseas woman.   Residual depression, anxiety, sleep problems with erratic schedule.  Procrastinates. Tolerates meds. Plan: OK increase Adderall to 10 TID to prolong productivity period which could also help mood.  11/08/2020 appointment with the following noted: Increased Adderall TID helped productivity.  Organizes closests but there's stuff all over the house and working on it.   Clonazepam  variable.  Needs it to go out places. But some days don't take it. Still takes sertraline 200 and trazodone 150-300.   Sleep is OK.  Occ NM less often. 5 cats and a dog. Some depression but manageable.  Anxiety episodic. Tolerating meds.  Ongoing GI problems.  Takes antibiotic for it with benefit. Plan:  OK increase Adderall to 10 TID to prolong productivity period which could also help mood.  05/10/21 appt noted: Longer duration with Adderall 10 TID bu tstill procrastinates. Don't do much.  Can't stand the heat.  Some yard work Not crying or marked depression.  Not sad.  Has interests. Sleep is good. Anxierty with losing things or crowds or rushed. Clonazepam to go out. Not daily. Plan: No med changes, continue sertraline 200 mg and Adderall 10 mg 3 times daily, trazodone 300 mg HS, Buspirone 30 BID, clonazepam 0.5 mg prn  01/28/2022 appointment with the following noted: Average clonazepam up to 2 daily and some days none. Allergy problems. Some ups and downs since here but overall OK. Consistent with meds without problems. No SE 1-2 panic since here. Clonazepam helps. Sleep good with trazodone 10 hours.  Like to have my sleep. Lonely around holidays. Plan: no med changes. Trazodone helps sleep. 300 HS Continue sertraline 200 mg daily. Buspirone 30 BID  Adderall 10 TID  Continue prn clonazepam.  07/31/22 appt noted: late bc wrong turn Overall ok with good days and bad days.only 1-2 days down at time.  Hard to tell.  Anxiety will make head sweating  worse and always had this.  Hard on herself.  Still in cluttered mess.  Enjoys cats and dog. Stay home so don't go buy things and add to clutter. Consistent with meds. No med concerns. No SE with meds. Occ panic.  Last in a couple of weeks and manages with breathing techniques. Married 3 times. Plan: Trazodone helps sleep. 300 HS Continue sertraline 200 mg daily. Buspirone 30 BID  Adderall 10 TID  Continue prn clonazepam.  04/17/23  appt noted: Meds as above. No SE and consistent with meds. Been doing ok.  Been through a hermit stage which doesn't bother her but getting out more.   Anxiety not good and driven by clutter Stressed with house.  Anxiety can make her unsteady.   Found new PCP Not buying new things but hard to get rid of things. Dep less of a problem, not that unsatisfied.  But anxiety can interfere with her function Plan: no med changes  10/20/23 appt noted: Meds as above plus no gabapentin 100 mg BID.  Clonazepam not more than once daily. Her cats and dog keep her company.   Working on hoarding.  Too embarrassing to have anyone come in to help her.  Bottom line is I'm the one responsible.  Some practice.  Doesn't make her anxious to get rid of things but needs energy and motivation to tackle thing.s I've lost most people I loved.  Just lost a good friend.   Not going to let me get it down.   No SE.   Anxiety DT stressors.  House is so cluttered its a chore to get dressed.   Meds are ok but some struggles sleeping with anxiety when awakens from NM.  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  Left a bitter taste in her mouth.    Past Psychiatric Medication Trials: Patient reportedly been on Adderall for about 25 years.   Clonazepam 0.5, lorazepam buspirone 30 twice daily, sertraline 200,  Wellbutrin, citalopram, trazodone, gabapentin, hydroxyzine, Lunesta,  olanzapine, Abilify, Saphris,   No sig therapy for hoarding.  Review of Systems:  Review of Systems  Cardiovascular:  Negative for palpitations.  Gastrointestinal:  Negative for abdominal pain and diarrhea.  Musculoskeletal:  Positive for arthralgias.  Neurological:  Negative for tremors and weakness.  Psychiatric/Behavioral:  Positive for sleep disturbance. The patient is nervous/anxious.   GI problems diet related in part  Medications: I have reviewed the patient's current  medications.  Current Outpatient Medications  Medication Sig Dispense Refill   antiseptic oral rinse (BIOTENE) LIQD 15 mLs by Mouth Rinse route as needed for dry mouth (uses when she brushes her teeth).     busPIRone (BUSPAR) 30 MG tablet Take 1 tablet (30 mg total) by mouth 2 (two) times daily. 180 tablet 1   clotrimazole-betamethasone (LOTRISONE) cream APPLY TO AFFECTED AREA TOPICALLY 3 TIMES A DAY FOR 3 MONTHS     cycloSPORINE (RESTASIS) 0.05 % ophthalmic emulsion 1 drop 2 (two) times daily.     Omega-3 Fatty Acids (FISH OIL) 1000 MG CAPS Take 1,000 mg by mouth 2 (two) times daily.     omeprazole (PRILOSEC) 40 MG capsule TAKE 1 CAPSULE (40 MG TOTAL) BY MOUTH DAILY. 30 capsule 0   PREVIDENT 5000 DRY MOUTH 1.1 % GEL dental gel      valsartan (DIOVAN) 80 MG tablet Take 80 mg by mouth daily.     amphetamine-dextroamphetamine (ADDERALL)  10 MG tablet Take 1 tablet (10 mg total) by mouth 3 (three) times daily. 270 tablet 0   clonazePAM (KLONOPIN) 0.5 MG tablet Take 1 tablet (0.5 mg total) by mouth 3 (three) times daily as needed for anxiety. 60 tablet 5   sertraline (ZOLOFT) 100 MG tablet Take 2 tablets (200 mg total) by mouth daily. 180 tablet 1   traZODone (DESYREL) 150 MG tablet Take 1-2 tablets (150-300 mg total) by mouth daily. 180 tablet 1   No current facility-administered medications for this visit.    Medication Side Effects: None  Allergies:  Allergies  Allergen Reactions   Morphine Other (See Comments)    REACTION: paranoid    Past Medical History:  Diagnosis Date   ADD (attention deficit disorder)    Allergy    Anemia, normocytic normochromic 12/25/2012   Mild, chronic, Hb 11-12 grams at least since 11/2006; screening studies & endoscopies unremarkable   Anxiety    Back pain    Breast cancer (HCC) 2007   Chronic kidney disease    hx of stage 3 kidney disease    Depression    Diverticulosis of colon (without mention of hemorrhage)    Esophageal reflux    H/O  dehydration    Headache    PMH   Heart murmur    History of blood transfusion    History of radiation therapy    Hypercholesterolemia    Hypertension    Irritable bowel syndrome    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   Osteopenia    Ovarian cystic mass 07/07/2015   Personal history of radiation therapy 2008   PONV (postoperative nausea and vomiting)    also had severe itching    Pyloric stenosis    Repeated falls    Shortness of breath dyspnea    walking distances or climbing stairs    Family History  Problem Relation Age of Onset   Miscarriages / [place] Mother    Hearing loss Father    Arthritis Father    Other Father        colon rupture   Diabetes Maternal Aunt    Breast cancer Maternal Aunt    Breast cancer Maternal Aunt        X 3   Breast cancer Maternal Aunt    Cancer Maternal Grandfather    Prostate cancer Maternal Grandfather    Arthritis Paternal Grandmother    Early death Paternal Grandfather    Drug abuse Paternal Grandfather    Pancreatic cancer Cousin     Social History   Socioeconomic History   Marital status: Divorced    Spouse name: Not on file   Number of children: 0   Years of education: Not on file   Highest education level: Not on file  Occupational History   Occupation: Retired Magazine features editor: RETIRED  Tobacco Use   Smoking status: Never   Smokeless tobacco: Never  Vaping Use   Vaping status: Never Used  Substance and Sexual Activity   Alcohol use: Yes    Alcohol/week: 0.0 standard drinks of alcohol    Comment: rarely   Drug use: No   Sexual activity: Not Currently  Other Topics Concern   Not on file  Social History Narrative   She is divorced. No children. She is a retired Insurance account manager.  infertility   Lives alone.   One caffeinated beverage daily.      Social Determinants of Health  Financial Resource Strain: Low Risk  (04/14/2023)   Overall Financial Resource  Strain (CARDIA)    Difficulty of Paying Living Expenses: Not hard at all  Food Insecurity: No Food Insecurity (04/14/2023)   Hunger Vital Sign    Worried About Running Out of Food in the Last Year: Never true    Ran Out of Food in the Last Year: Never true  Transportation Needs: No Transportation Needs (04/14/2023)   PRAPARE - Administrator, Civil Service (Medical): No    Lack of Transportation (Non-Medical): No  Physical Activity: Inactive (04/14/2023)   Exercise Vital Sign    Days of Exercise per Week: 0 days    Minutes of Exercise per Session: 0 min  Stress: No Stress Concern Present (04/14/2023)   [employer] of Occupational Health - Occupational Stress Questionnaire    Feeling of Stress : Only a little  Social Connections: Moderately Isolated (04/14/2023)   Social Connection and Isolation Panel [NHANES]    Frequency of Communication with Friends and Family: More than three times a week    Frequency of Social Gatherings with Friends and Family: Once a week    Attends Religious Services: 1 to 4 times per year    Active Member of [employer] or Organizations: No    Attends Banker Meetings: Never    Marital Status: Divorced  Catering manager Violence: Not At Risk (04/14/2023)   Humiliation, Afraid, Rape, and Kick questionnaire    Fear of Current or Ex-Partner: No    Emotionally Abused: No    Physically Abused: No    Sexually Abused: No    Past Medical History, Surgical history, Social history, and Family history were reviewed and updated as appropriate.   Lives now with man shes dated for 20 years who moved in due to PD  Please see review of systems for further details on the patient's review from today.   Objective:   Physical Exam:  There were no vitals taken for this visit.  Physical Exam Constitutional:      General: She is not in acute distress.    Appearance: She is well-developed. She is obese.  Genitourinary:    Rectum: Normal.   Musculoskeletal:        General: No deformity.  Neurological:     Mental Status: She is alert and oriented to person, place, and time.     Cranial Nerves: No dysarthria.     Coordination: Coordination normal.  Psychiatric:        Attention and Perception: She is attentive.        Mood and Affect: Mood is anxious. Mood is not depressed. Affect is not labile, blunt, angry, tearful or inappropriate.        Speech: Speech normal. Speech is not rapid and pressured or slurred.        Behavior: Behavior normal.        Thought Content: Thought content normal. Thought content is not delusional. Thought content does not include homicidal or suicidal ideation. Thought content does not include suicidal plan.        Cognition and Memory: Cognition normal.        Judgment: Judgment normal.     Comments: Insight is fair.    sitational depression generally mild. Residual chronic anxiety     Lab Review:     Component Value Date/Time   NA 135 07/11/2023 1210   NA 142 01/15/2017 0901   K 3.9 07/11/2023 1210   K 4.2  01/15/2017 0901   CL 104 07/11/2023 1210   CO2 23 07/11/2023 1210   CO2 25 01/15/2017 0901   GLUCOSE 113 (H) 07/11/2023 1210   GLUCOSE 100 01/15/2017 0901   BUN 27 (H) 07/11/2023 1210   BUN 22.3 01/15/2017 0901   CREATININE 1.40 (H) 07/11/2023 1210   CREATININE 1.3 (H) 01/15/2017 0901   CALCIUM 9.6 07/11/2023 1210   CALCIUM 9.3 01/15/2017 0901   PROT 6.3 04/08/2023 1457   PROT 6.6 01/15/2017 0901   ALBUMIN 3.9 04/08/2023 1457   ALBUMIN 3.6 01/15/2017 0901   AST 15 04/08/2023 1457   AST 14 01/15/2017 0901   ALT 11 04/08/2023 1457   ALT 10 01/15/2017 0901   ALKPHOS 57 04/08/2023 1457   ALKPHOS 65 01/15/2017 0901   BILITOT 0.4 04/08/2023 1457   BILITOT 0.27 01/15/2017 0901   GFRNONAA >60 10/13/2015 0440   GFRAA >60 10/13/2015 0440       Component Value Date/Time   WBC 6.2 04/08/2023 1457   RBC 3.28 (L) 04/08/2023 1457   HGB 10.0 (L) 04/08/2023 1457   HGB 10.1 (L)  01/15/2017 0901   HCT 30.0 (L) 04/08/2023 1457   HCT 31.5 (L) 01/15/2017 0901   PLT 188.0 04/08/2023 1457   PLT 177 01/15/2017 0901   MCV 91.5 04/08/2023 1457   MCV 88.7 01/15/2017 0901   MCH 28.5 01/15/2017 0901   MCH 30.4 10/13/2015 0440   MCHC 33.4 04/08/2023 1457   RDW 14.5 04/08/2023 1457   RDW 14.1 01/15/2017 0901   LYMPHSABS 1.6 04/08/2023 1457   LYMPHSABS 2.4 01/15/2017 0901   MONOABS 0.6 04/08/2023 1457   MONOABS 0.6 01/15/2017 0901   EOSABS 0.2 04/08/2023 1457   EOSABS 0.5 01/15/2017 0901   BASOSABS 0.0 04/08/2023 1457   BASOSABS 0.0 01/15/2017 0901    No results found for: "POCLITH", "LITHIUM"   No results found for: "PHENYTOIN", "PHENOBARB", "VALPROATE", "CBMZ"   .res Assessment: Plan:    Jazlyn "[name]" was seen today for follow-up, anxiety, sleeping problem and depression.  Diagnoses and all orders for this visit:  Panic disorder with agoraphobia -     clonazePAM (KLONOPIN) 0.5 MG tablet; Take 1 tablet (0.5 mg total) by mouth 3 (three) times daily as needed for anxiety. -     sertraline (ZOLOFT) 100 MG tablet; Take 2 tablets (200 mg total) by mouth daily.  Hoarding disorder  Generalized anxiety disorder -     sertraline (ZOLOFT) 100 MG tablet; Take 2 tablets (200 mg total) by mouth daily.  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  Attention deficit hyperactivity disorder (ADHD), combined type -     amphetamine-dextroamphetamine (ADDERALL) 10 MG tablet; Take 1 tablet (10 mg total) by mouth 3 (three) times daily.  Insomnia due to mental condition -     traZODone (DESYREL) 150 MG tablet; Take 1-2 tablets (150-300 mg total) by mouth daily.    30 min face to face time with patient was spent on counseling and coordination of care. We discussed diagnoses to which should be added hoarding.  Struggling with this ongoing producing a good deal of anxiety..  We  discussed the short-term risks associated with benzodiazepines including sedation and increased fall risk among others.  Discussed long-term side effect risk including dependence, potential withdrawal symptoms, and the potential eventual dose-related risk of dementia.  But recent studies from 2020 dispute this association between  benzodiazepines and dementia risk. Newer studies in 2020 do not support an association with dementia.  Not ideal to take stimulants and clonazepam but needs BZ to leave the house.  Fearful.  Adderall helps function. OK  Adderall 10 TID to prolong productivity period which could also help mood. Some avoidance.  Discussed potential benefits, risks, and side effects of stimulants with patient to include increased heart rate, palpitations, insomnia, increased anxiety, increased irritability, or decreased appetite.  Instructed patient to contact office if experiencing any significant tolerability issues.  17 min counseling; Supportive therapy dealing with broken relationship and loneliness.  Disc hoarding and pushing herself through procrastination and hoarding.  She is making some progress since here. Encourage activity and return to [employer]. Encouraget consult with downsizing consultant of if that does not work then a therpist with expertise hoarding. Disc recent govt study that 7% of adults over 70 have HD. 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  Including journaling about prior principal.   Disc therapeutic letter writing.   No med changes indicated.   Trazodone helps sleep. 300 HS Continue sertraline 200 mg daily. Buspirone 30 BID  Adderall 10 TID  Continue prn clonazepam.  Benefit and tolerating meds.  FU 6 mos  Meredith Staggers, MD, DFAPA   Please see After Visit Summary for patient specific instructions.  Future Appointments  Date Time Provider Department Center  04/19/2024 11:30 AM [facility] ANNUAL WELLNESS VISIT 1 [facility]    No  orders of the defined types were placed in this encounter.    -------------------------------

## 2023-10-26 ENCOUNTER — Other Ambulatory Visit: Payer: Self-pay | Admitting: Family Medicine

## 2023-11-30 ENCOUNTER — Other Ambulatory Visit: Payer: Self-pay | Admitting: Psychiatry

## 2023-11-30 DIAGNOSIS — F5105 Insomnia due to other mental disorder: Secondary | ICD-10-CM

## 2024-02-06 ENCOUNTER — Telehealth: Payer: Self-pay | Admitting: *Deleted

## 2024-02-06 NOTE — Telephone Encounter (Signed)
 Copied from CRM [id number]. Topic: Clinical - Prescription Issue >> Feb 06, 2024 12:29 PM Sonny Dandy B wrote: Reason for CRM: Pt daughter Gloria Fields called to follow up on DME order for Diapers for her mom. She states she has call several times. No on has called her back. States mom really needs Diapers. Would like some one to call her back. [phone]

## 2024-02-06 NOTE — Telephone Encounter (Signed)
 This is the incorrect patient. Sending telephone note for correct patient. This patient does not need diapers.

## 2024-02-10 ENCOUNTER — Ambulatory Visit: Admitting: Family Medicine

## 2024-02-10 ENCOUNTER — Encounter: Payer: Self-pay | Admitting: Family Medicine

## 2024-02-10 VITALS — BP 124/74 | HR 95 | Temp 98.3°F | Resp 16 | Ht 61.0 in | Wt 136.2 lb

## 2024-02-10 DIAGNOSIS — D649 Anemia, unspecified: Secondary | ICD-10-CM

## 2024-02-10 DIAGNOSIS — F330 Major depressive disorder, recurrent, mild: Secondary | ICD-10-CM | POA: Diagnosis not present

## 2024-02-10 DIAGNOSIS — E7849 Other hyperlipidemia: Secondary | ICD-10-CM

## 2024-02-10 DIAGNOSIS — K219 Gastro-esophageal reflux disease without esophagitis: Secondary | ICD-10-CM | POA: Diagnosis not present

## 2024-02-10 DIAGNOSIS — E559 Vitamin D deficiency, unspecified: Secondary | ICD-10-CM | POA: Diagnosis not present

## 2024-02-10 DIAGNOSIS — I10 Essential (primary) hypertension: Secondary | ICD-10-CM

## 2024-02-10 DIAGNOSIS — F411 Generalized anxiety disorder: Secondary | ICD-10-CM

## 2024-02-10 DIAGNOSIS — Z1159 Encounter for screening for other viral diseases: Secondary | ICD-10-CM | POA: Diagnosis not present

## 2024-02-10 LAB — COMPREHENSIVE METABOLIC PANEL
ALT: 9 U/L (ref 0–35)
AST: 15 U/L (ref 0–37)
Albumin: 4.3 g/dL (ref 3.5–5.2)
Alkaline Phosphatase: 64 U/L (ref 39–117)
BUN: 23 mg/dL (ref 6–23)
CO2: 26 meq/L (ref 19–32)
Calcium: 9.6 mg/dL (ref 8.4–10.5)
Chloride: 101 meq/L (ref 96–112)
Creatinine, Ser: 1.46 mg/dL — ABNORMAL HIGH (ref 0.40–1.20)
GFR: 34.77 mL/min — ABNORMAL LOW (ref >60.00)
Glucose, Bld: 114 mg/dL — ABNORMAL HIGH (ref 70–99)
Potassium: 3.6 meq/L (ref 3.5–5.1)
Sodium: 138 meq/L (ref 135–145)
Total Bilirubin: 0.3 mg/dL (ref 0.2–1.2)
Total Protein: 6.9 g/dL (ref 6.0–8.3)

## 2024-02-10 LAB — CBC WITH DIFFERENTIAL/PLATELET
Basophils Absolute: 0 10*3/uL (ref 0.0–0.1)
Basophils Relative: 0.6 % (ref 0.0–3.0)
Eosinophils Absolute: 0.1 10*3/uL (ref 0.0–0.7)
Eosinophils Relative: 1.7 % (ref 0.0–5.0)
HCT: 35.4 % — ABNORMAL LOW (ref 36.0–46.0)
Hemoglobin: 11.8 g/dL — ABNORMAL LOW (ref 12.0–15.0)
Lymphocytes Relative: 19.8 % (ref 12.0–46.0)
Lymphs Abs: 1.3 10*3/uL (ref 0.7–4.0)
MCHC: 33.2 g/dL (ref 30.0–36.0)
MCV: 89.7 fl (ref 78.0–100.0)
Monocytes Absolute: 0.4 10*3/uL (ref 0.1–1.0)
Monocytes Relative: 6.1 % (ref 3.0–12.0)
Neutro Abs: 4.8 10*3/uL (ref 1.4–7.7)
Neutrophils Relative %: 71.8 % (ref 43.0–77.0)
Platelets: 234 10*3/uL (ref 150.0–400.0)
RBC: 3.95 Mil/uL (ref 3.87–5.11)
RDW: 13.2 % (ref 11.5–15.5)
WBC: 6.7 10*3/uL (ref 4.0–10.5)

## 2024-02-10 LAB — IBC + FERRITIN
Ferritin: 87.7 ng/mL (ref 10.0–291.0)
Iron: 52 ug/dL (ref 42–145)
Saturation Ratios: 18 % — ABNORMAL LOW (ref 20.0–50.0)
TIBC: 288.4 ug/dL (ref 250.0–450.0)
Transferrin: 206 mg/dL — ABNORMAL LOW (ref 212.0–360.0)

## 2024-02-10 LAB — LIPID PANEL
Cholesterol: 213 mg/dL — ABNORMAL HIGH (ref 0–200)
HDL: 50.8 mg/dL (ref >39.00)
LDL Cholesterol: 135 mg/dL — ABNORMAL HIGH (ref 0–99)
NonHDL: 162.68
Total CHOL/HDL Ratio: 4
Triglycerides: 139 mg/dL (ref 0.0–149.0)
VLDL: 27.8 mg/dL (ref 0.0–40.0)

## 2024-02-10 LAB — VITAMIN D 25 HYDROXY (VIT D DEFICIENCY, FRACTURES): VITD: 27.85 ng/mL — ABNORMAL LOW (ref 30.00–100.00)

## 2024-02-10 LAB — VITAMIN B12: Vitamin B-12: 694 pg/mL (ref 211–911)

## 2024-02-10 LAB — MAGNESIUM: Magnesium: 1.7 mg/dL (ref 1.5–2.5)

## 2024-02-10 MED ORDER — OMEPRAZOLE 40 MG PO CPDR: 40.0000 mg | DELAYED_RELEASE_CAPSULE | Freq: Every day | ORAL | 1 refills | Status: DC

## 2024-02-10 MED ORDER — VALSARTAN 80 MG PO TABS: 80.0000 mg | ORAL_TABLET | Freq: Every day | ORAL | 1 refills | Status: DC

## 2024-02-10 NOTE — Progress Notes (Signed)
 Subjective:     Patient ID: Gloria Fields, female    DOB: Sep 19, 1946, 78 y.o.   MRN: [id number]  Chief Complaint  Patient presents with   Medical Management of Chronic Issues    Follow-up on htn, hld, gerd, anxiety Fasting     HPI 1j HYPERTENSION-on valsartan 80 mg.  Can't find cuff.  No headache(s)/dizziness/chest pain/edema.  Chronic dyspnea on exertion-deconditioning.     2.  HLD-not want medications.  No exercise.  3.  Moods/attention deficit disorder-stable on medications.  Seeing mental health.  No SI. 4.  Unsteady on feet.  Physical therapy helped.  Doesn't get out much(choice) 5.  GERD-doing well mostly  occasional loose stools.  Taking Omeprazole 40mg  daily. No dysphagia 6.  Vitamin D-did better on prescription   not taking reg   Discussed the use of AI scribe software for clinical note transcription with the patient, who gave verbal consent to proceed.  History of Present Illness   Gloria Fields "Gloria Fields" is a 78 year old female who presents for follow-up care.  She sustained a burn injury on her right wrist from spilling hot potato soup approximately a week ago. Initially, she treated it with hydrogen peroxide and Vicks VapoRub. The skin was initially attached but is now not well attached.  She is currently taking Valsartan 80 mg for hypertension but has not been monitoring her blood pressure due to a misplaced blood pressure machine. No headaches, dizziness, chest pain, or shortness of breath.  For heartburn, she takes Prilosec 40 mg daily and reports no breakthrough heartburn, food getting stuck, or bleeding.  She continues to see a psychiatrist for depression and anxiety and is on Adderall, buspirone, clonazepam, sertraline, and trazodone. She feels stable on these medications and denies any thoughts of suicide. She describes feeling overwhelmed by clutter and lacks energy to tackle it, despite taking Adderall. She struggles with procrastination and  maintaining a schedule, often feeling 'better doing nothing than trying to attack it with full force.'  She reports a history of anemia but is unsure of the cause. She experiences leg cramps occasionally, which she attributes to possible magnesium or potassium deficiency.  She has not been taking her vitamin D regularly but has purchased some over-the-counter vitamins. She mentions difficulty swallowing some vitamins.  She recently had cataract surgery and reports no complications from the procedure.       Health Maintenance Due  Topic Date Due   Hepatitis C Screening  Never done   DTaP/Tdap/Td (1 - Tdap) Never done   Zoster Vaccines- Shingrix (1 of 2) Never done   Pneumonia Vaccine 61+ Years old (1 of 1 - PCV) Never done   INFLUENZA VACCINE  06/26/2023   COVID-19 Vaccine (5 - 2024-25 season) 07/27/2023    Past Medical History:  Diagnosis Date   ADD (attention deficit disorder)    Allergy    Anemia, normocytic normochromic 12/25/2012   Mild, chronic, Hb 11-12 grams at least since 11/2006; screening studies & endoscopies unremarkable   Anxiety    Back pain    Breast cancer (HCC) 2007   Chronic kidney disease    hx of stage 3 kidney disease    Depression    Diverticulosis of colon (without mention of hemorrhage)    Esophageal reflux    H/O dehydration    Headache    PMH   Heart murmur    History of blood transfusion    History of radiation therapy    Hypercholesterolemia  Hypertension    Irritable bowel syndrome    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   Osteopenia    Ovarian cystic mass 07/07/2015   Personal history of radiation therapy 2008   PONV (postoperative nausea and vomiting)    also had severe itching    Pyloric stenosis    Repeated falls    Shortness of breath dyspnea    walking distances or climbing stairs    Past Surgical History:  Procedure Laterality Date   APPENDECTOMY  2007   perforated appendicitis    BREAST LUMPECTOMY  2007   right   CATARACT EXTRACTION     COLONOSCOPY     COLONOSCOPY WITH PROPOFOL N/A 07/07/2015   Procedure: COLONOSCOPY WITH PROPOFOL;  Surgeon: Iva Boop, MD;  Location: [facility] ENDOSCOPY;  Service: Endoscopy;  Laterality: N/A;  NEED ULTRA SLIM COLONOSCOPE PLEASE, possible pyloric dilation   ESOPHAGOGASTRODUODENOSCOPY     ESOPHAGOGASTRODUODENOSCOPY N/A 07/07/2015   Procedure: ESOPHAGOGASTRODUODENOSCOPY (EGD);  Surgeon: Iva Boop, MD;  Location: [facility] ENDOSCOPY;  Service: Endoscopy;  Laterality: N/A;  POSSIBLE PYLORIC DILATION   HERNIA REPAIR Left    L and ventral   LAPAROSCOPIC BILATERAL SALPINGO OOPHERECTOMY Bilateral 10/12/2015   Procedure: LAPAROSCOPIC  LEFT SALPINGO OOPHORECTOMY AND LYSIS OF ADHESIONS ;  Surgeon: Adolphus Birchwood, MD;  Location: [facility] ORS;  Service: Gynecology;  Laterality: Bilateral;   LAPAROSCOPIC LYSIS OF ADHESIONS  10/29/2012   Procedure: LAPAROSCOPIC LYSIS OF ADHESIONS;  Surgeon: Valarie Merino, MD;  Location: [facility] ORS;  Service: General;;   UPPER GASTROINTESTINAL ENDOSCOPY  Multiple   UTERINE FIBROID SURGERY     with subsequent scar tissue removal   VENTRAL HERNIA REPAIR  10/29/2012   Procedure: LAPAROSCOPIC VENTRAL HERNIA;  Surgeon: Valarie Merino, MD;  Location: [facility];  Service: General;  Laterality: N/A;  LAPRASCOPIC  ASSISTED OPEN VENTRAL HERNIA REPAIR, LAPRASCOPIC LYSIS OF ADHESIONS    VENTRAL HERNIA REPAIR N/A 10/12/2015   Procedure: LAPAROSCOPIC VENTRAL HERNIA REPAIR ;  Surgeon: Luretha Murphy, MD;  Location: [facility];  Service: General;  Laterality: N/A;   WISDOM TOOTH EXTRACTION       Current Outpatient Medications:    amphetamine-dextroamphetamine (ADDERALL) 10 MG tablet, Take 1 tablet (10 mg total) by mouth 3 (three) times daily., Disp: 270 tablet, Rfl: 0   antiseptic oral rinse (BIOTENE) LIQD, 15 mLs by Mouth Rinse route as needed for dry mouth (uses when she brushes her teeth)., Disp: , Rfl:    busPIRone (BUSPAR) 30 MG tablet, Take 1  tablet (30 mg total) by mouth 2 (two) times daily., Disp: 180 tablet, Rfl: 1   clonazePAM (KLONOPIN) 0.5 MG tablet, Take 1 tablet (0.5 mg total) by mouth 3 (three) times daily as needed for anxiety., Disp: 60 tablet, Rfl: 5   clotrimazole-betamethasone (LOTRISONE) cream, APPLY TO AFFECTED AREA TOPICALLY 3 TIMES A DAY FOR 3 MONTHS, Disp: , Rfl:    cycloSPORINE (RESTASIS) 0.05 % ophthalmic emulsion, 1 drop 2 (two) times daily., Disp: , Rfl:    Omega-3 Fatty Acids (FISH OIL) 1000 MG CAPS, Take 1,000 mg by mouth 2 (two) times daily., Disp: , Rfl:    PREVIDENT 5000 DRY MOUTH 1.1 % GEL dental gel, , Disp: , Rfl:    sertraline (ZOLOFT) 100 MG tablet, Take 2 tablets (200 mg total) by mouth daily., Disp: 180 tablet, Rfl: 1   traZODone (DESYREL) 150 MG tablet, TAKE 1-2 TABLETS (150-300 MG TOTAL) BY MOUTH DAILY., Disp: 180 tablet, Rfl: 1  omeprazole (PRILOSEC) 40 MG capsule, Take 1 capsule (40 mg total) by mouth daily., Disp: 90 capsule, Rfl: 1   valsartan (DIOVAN) 80 MG tablet, Take 1 tablet (80 mg total) by mouth daily., Disp: 90 tablet, Rfl: 1  Allergies  Allergen Reactions   Morphine Other (See Comments)    REACTION: paranoid   ROS neg/noncontributory except as noted HPI/below      Objective:     BP 124/74   Pulse 95   Temp 98.3 F (36.8 C) (Temporal)   Resp 16   Ht 5\' 1"  (1.549 m)   Wt 136 lb 4 oz (61.8 kg)   SpO2 95%   BMI 25.74 kg/m  Wt Readings from Last 3 Encounters:  02/10/24 136 lb 4 oz (61.8 kg)  04/14/23 142 lb (64.4 kg)  04/08/23 142 lb (64.4 kg)    Physical Exam   Gen: [facility] NAD HEENT: NCAT, conjunctiva not injected, sclera nonicteric NECK:  supple, no thyromegaly, no nodes, no carotid bruits CARDIAC: RRR, S1S2+,1/6 murmur. DP 1+B LUNGS: CTAB. No wheezes ABDOMEN:  BS+, soft, NTND, No HSM, no masses EXT:  no edema MSK: no gross abnormalities.  NEURO: A&O x3.  CN II-XII intact.  PSYCH: normal mood. Good eye contact     Assessment & Plan:  Primary  hypertension -     CBC with Differential/Platelet -     Comprehensive metabolic panel -     Magnesium -     Valsartan; Take 1 tablet (80 mg total) by mouth daily.  Dispense: 90 tablet; Refill: 1  Gastroesophageal reflux disease without esophagitis -     Omeprazole; Take 1 capsule (40 mg total) by mouth daily.  Dispense: 90 capsule; Refill: 1  Other hyperlipidemia -     Lipid panel  Vitamin D deficiency -     VITAMIN D 25 Hydroxy (Vit-D Deficiency, Fractures)  Anemia, normocytic normochromic -     CBC with Differential/Platelet -     IBC + Ferritin -     Vitamin B12  Screening for viral disease -     Hepatitis C antibody  Generalized anxiety disorder  Mild episode of recurrent major depressive disorder (HCC)  Assessment and Plan    Burn on right wrist   She sustained an acute burn on her right wrist from hot potato soup about a week ago. She has been using hydrogen peroxide and Vicks VapoRub, which is not recommended as hydrogen peroxide can damage healthy tissue. Discontinue hydrogen peroxide use. Apply bacitracin to the burn and cover it during activities that may expose it to dirt or contaminants.  Hypertension   Chronic hypertension is managed with valsartan 80 mg. She has not been monitoring her blood pressure at home due to a misplaced blood pressure machine. Regular monitoring is crucial for effective management. Advise purchasing a new blood pressure monitor if the current one cannot be found. Continue valsartan 80 mg as prescribed.  Gastroesophageal Reflux Disease (GERD)   Chronic GERD is managed with Prilosec 40 mg daily. She reports no breakthrough heartburn, food getting stuck, or bleeding. Continue Prilosec 40 mg daily.  Depression and Anxiety   Chronic depression and anxiety are managed with Adderall, buspirone, clonazepam, sertraline, and trazodone. She reports stability with the current medication regimen and denies suicidal thoughts. Continued psychiatric  follow-up is important to maintain mental health stability. Continue current psychiatric medications and encourage continued follow-up with psychiatric care.  Anemia   She frequently presents with anemia, though the cause is unclear. Reports  low energy levels, possibly related to anemia. Further evaluation is needed to determine the cause and appropriate treatment. Order blood work to assess anemia and determine if iron supplementation is needed.  Vitamin D Deficiency   She has not been consistently taking vitamin D supplements, which are important for bone health and overall well-being. Regular supplementation is necessary to prevent deficiency-related complications. Advise resuming vitamin D supplementation and check vitamin D levels.  Clutter and Lack of Energy   She feels overwhelmed by clutter and lacks energy to address it. Lives alone with no immediate family nearby to assist. Discussed strategies to manage clutter and improve energy levels, including setting small goals and seeking external help. Recommend setting small, manageable goals for decluttering and suggest finding a college or high school student to assist. Advise taking a B complex vitamin, preferably dissolvable, to help with energy levels.  General Health Maintenance   Advised to maintain hydration and monitor kidney function. Continue vitamin supplementation to address potential deficiencies. Regular health maintenance is important to prevent complications. Encourage adequate fluid intake, order blood work to monitor kidney function, and advise taking a B complex vitamin.       Follow up 6 mo  Angelena Sole, MD

## 2024-02-10 NOTE — Patient Instructions (Addendum)
 It was very nice to see you today!  Tackle a small project 59minutes/day  Maybe get a college student to help  B complex-ideally dissolvable.   PLEASE NOTE:  If you had any lab tests please let us know if you have not heard back within a few days. You may see your results on MyChart before we have a chance to review them but we will give you a call once they are reviewed by [place]. If we ordered any referrals today, please let us know if you have not heard from their office within the next week.   Please try these tips to maintain a healthy lifestyle:  Eat most of your calories during the day when you are active. Eliminate processed foods including packaged sweets (pies, cakes, cookies), reduce intake of potatoes, white bread, white pasta, and white rice. Look for whole grain options, oat flour or almond flour.  Each meal should contain half fruits/vegetables, one quarter protein, and one quarter carbs (no bigger than a computer mouse).  Cut down on sweet beverages. This includes juice, soda, and sweet tea. Also watch fruit intake, though this is a healthier sweet option, it still contains natural sugar! Limit to 3 servings daily.  Drink at least 1 glass of water with each meal and aim for at least 8 glasses per day  Exercise at least 150 minutes every week.

## 2024-02-11 LAB — HEPATITIS C ANTIBODY: Hepatitis C Ab: NONREACTIVE

## 2024-02-12 ENCOUNTER — Encounter: Payer: Self-pay | Admitting: Family Medicine

## 2024-02-12 NOTE — Progress Notes (Signed)
 Labs are stable Cholesterol needs work-work on diet/exercise Anemia has improved.  She should take a vitamin with iron Vitamin D a little low-should take 1000iu/d or 5000iu/week.    She can try gummy vitamins if she chooses.

## 2024-04-19 ENCOUNTER — Ambulatory Visit: Payer: Medicare PPO

## 2024-04-20 ENCOUNTER — Ambulatory Visit (INDEPENDENT_AMBULATORY_CARE_PROVIDER_SITE_OTHER): Payer: Medicare PPO | Admitting: Psychiatry

## 2024-04-20 ENCOUNTER — Encounter: Payer: Self-pay | Admitting: Psychiatry

## 2024-04-20 DIAGNOSIS — F902 Attention-deficit hyperactivity disorder, combined type: Secondary | ICD-10-CM | POA: Diagnosis not present

## 2024-04-20 DIAGNOSIS — F423 Hoarding disorder: Secondary | ICD-10-CM

## 2024-04-20 DIAGNOSIS — F4001 Agoraphobia with panic disorder: Secondary | ICD-10-CM | POA: Diagnosis not present

## 2024-04-20 DIAGNOSIS — F411 Generalized anxiety disorder: Secondary | ICD-10-CM | POA: Diagnosis not present

## 2024-04-20 DIAGNOSIS — F5105 Insomnia due to other mental disorder: Secondary | ICD-10-CM | POA: Diagnosis not present

## 2024-04-20 DIAGNOSIS — F3342 Major depressive disorder, recurrent, in full remission: Secondary | ICD-10-CM

## 2024-04-20 MED ORDER — SERTRALINE HCL 100 MG PO TABS
200.0000 mg | ORAL_TABLET | Freq: Every day | ORAL | 1 refills | Status: AC
Start: 2024-04-20 — End: ?

## 2024-04-20 MED ORDER — BUSPIRONE HCL 30 MG PO TABS
30.0000 mg | ORAL_TABLET | Freq: Two times a day (BID) | ORAL | 1 refills | Status: DC
Start: 2024-04-20 — End: 2024-10-17

## 2024-04-20 MED ORDER — AMPHETAMINE-DEXTROAMPHETAMINE 10 MG PO TABS
10.0000 mg | ORAL_TABLET | Freq: Three times a day (TID) | ORAL | 0 refills | Status: DC
Start: 2024-04-20 — End: 2024-08-18

## 2024-04-20 MED ORDER — TRAZODONE HCL 150 MG PO TABS: 150.0000 mg | ORAL_TABLET | Freq: Every day | ORAL | 1 refills | Status: DC

## 2024-04-20 MED ORDER — CLONAZEPAM 0.5 MG PO TABS: 0.5000 mg | ORAL_TABLET | Freq: Three times a day (TID) | ORAL | 5 refills | Status: DC | PRN

## 2024-04-20 NOTE — Progress Notes (Signed)
 Gloria Fields [id number] 17-Jan-1946 78 y.o.  Subjective:   Patient ID:  Gloria Fields is a 78 y.o. (DOB 15-Aug-1946) female.  Chief Complaint:  Chief Complaint  Patient presents with   Follow-up   Depression   Anxiety   ADD   Sleeping Problem    Gloria Fields presents to the office today for follow-up of depression anxiety, ADD, sleep.  seen in September 2019.  Trazodone  was increased to 150 mg nightly for sleep and depression.  She was also given hydroxyzine  25 mg 3 times daily for anxiety.  seen June 2020 without med changes.  On Adderall 10 mg twice daily, buspirone  30 mg twice daily, clonazepam  0.5 mg twice daily, gabapentin  100 mg twice daily, hydroxyzine  25 3 times daily as needed, sertraline  200, trazodone  150-300 nightly for sleep  05/09/20 appt with following noted: Tremendous anxiety about leaving the house and needs clonazepam .  Uses prn. Average one daily or for sleep. Asks to increase Adderall to TID.  Wears off at 3 pm and can't do much.   Wants to clean out her house and be more productive.  Getting things done with Adderall in morning. House looks like hoarder house LT.   No kids, no husband.  Just ended 59 year rel with cruel man with PD.  He lost job house and car sending money to overseas woman.   Residual depression, anxiety, sleep problems with erratic schedule.  Procrastinates. Tolerates meds. Plan: OK increase Adderall to 10 TID to prolong productivity period which could also help mood.  11/08/2020 appointment with the following noted: Increased Adderall TID helped productivity.  Organizes closests but there's stuff all over the house and working on it.   Clonazepam  variable.  Needs it to go out places. But some days don't take it. Still takes sertraline  200 and trazodone  150-300.   Sleep is OK.  Occ NM less often. 5 cats and a dog. Some depression but manageable.  Anxiety episodic. Tolerating meds.  Ongoing GI problems.  Takes  antibiotic for it with benefit. Plan:  OK increase Adderall to 10 TID to prolong productivity period which could also help mood.  05/10/21 appt noted: Longer duration with Adderall 10 TID bu tstill procrastinates. Don't do much.  Can't stand the heat.  Some yard work Not crying or marked depression.  Not sad.  Has interests. Sleep is good. Anxierty with losing things or crowds or rushed. Clonazepam  to go out. Not daily. Plan: No med changes, continue sertraline  200 mg and Adderall 10 mg 3 times daily, trazodone  300 mg HS, Buspirone  30 BID, clonazepam  0.5 mg prn  01/28/2022 appointment with the following noted: Average clonazepam  up to 2 daily and some days none. Allergy problems. Some ups and downs since here but overall OK. Consistent with meds without problems. No SE 1-2 panic since here. Clonazepam  helps. Sleep good with trazodone  10 hours.  Like to have my sleep. Lonely around holidays. Plan: no med changes. Trazodone  helps sleep. 300 HS Continue sertraline  200 mg daily. Buspirone  30 BID  Adderall 10 TID  Continue prn clonazepam .  07/31/22 appt noted: late bc wrong turn Overall ok with good days and bad days.only 1-2 days down at time.  Hard to tell.  Anxiety will make head sweating worse and always had this.  Hard on herself.  Still in cluttered mess.  Enjoys cats and dog. Stay home so don't go buy things and add to clutter. Consistent with meds. No med concerns. No SE with meds.  Occ panic.  Last in a couple of weeks and manages with breathing techniques. Married 3 times. Plan: Trazodone  helps sleep. 300 HS Continue sertraline  200 mg daily. Buspirone  30 BID  Adderall 10 TID  Continue prn clonazepam .  04/17/23 appt noted: Meds as above. No SE and consistent with meds. Been doing ok.  Been through a hermit stage which doesn't bother her but getting out more.   Anxiety not good and driven by clutter Stressed with house.  Anxiety can make her unsteady.   Found new PCP Not  buying new things but hard to get rid of things. Dep less of a problem, not that unsatisfied.  But anxiety can interfere with her function Plan: no med changes  10/20/23 appt noted: Meds as above plus no gabapentin  100 mg BID.  Clonazepam  not more than once daily. Her cats and dog keep her company.   Working on hoarding.  Too embarrassing to have anyone come in to help her.  Bottom line is I'm the one responsible.  Some practice.  Doesn't make her anxious to get rid of things but needs energy and motivation to tackle thing.s I've lost most people I loved.  Just lost a good friend.   Not going to let me get it down.   No SE.   Anxiety DT stressors.  House is so cluttered its a chore to get dressed.   Meds are ok but some struggles sleeping with anxiety when awakens from NM.  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  Left a bitter taste in her mouth.   Plan: No med changes indicated.   Trazodone  helps sleep. 300 HS Continue sertraline  200 mg daily. Buspirone  30 BID  Adderall 10 TID  Continue prn clonazepam .  04/20/24 appt noted: Meds noted Doing better mood and anxiety.  A little progress with dealing with hoarding.  A little less .  Still no direct path in house without stepping over things.  Harder to get rid of family things.  overwhelmed.   Still needs the meds.  No SE. Sleep limited awakening.  Reduced NM No new concerns. Health ok.  Benefit ADD.  Past Psychiatric Medication Trials: Patient reportedly been on Adderall for about 25 years.   Clonazepam  0.5, lorazepam buspirone  30 twice daily, sertraline  200,  Wellbutrin, citalopram, trazodone , gabapentin , hydroxyzine , Lunesta,  olanzapine, Abilify, Saphris,   No sig therapy for hoarding.  Review of Systems:  Review of Systems  Cardiovascular:  Negative for palpitations.  Gastrointestinal:  Negative for abdominal pain and diarrhea.  Musculoskeletal:  Positive for  arthralgias.  Neurological:  Negative for tremors and weakness.  Psychiatric/Behavioral:  Positive for sleep disturbance. The patient is nervous/anxious.   GI problems diet related in part  Medications: I have reviewed the patient's current medications.  Current Outpatient Medications  Medication Sig Dispense Refill   antiseptic oral rinse (BIOTENE) LIQD 15 mLs by Mouth Rinse route as needed for dry mouth (uses when she brushes her teeth).     clotrimazole -betamethasone  (LOTRISONE ) cream APPLY TO AFFECTED AREA TOPICALLY 3 TIMES A DAY FOR 3 MONTHS     cycloSPORINE  (RESTASIS ) 0.05 % ophthalmic emulsion 1 drop 2 (two) times daily.     Omega-3 Fatty Acids (FISH OIL) 1000 MG CAPS Take 1,000 mg by mouth 2 (two) times daily.     omeprazole  (PRILOSEC) 40 MG capsule Take 1 capsule (40 mg total) by mouth daily. 90 capsule  1   PREVIDENT  5000 DRY MOUTH 1.1 % GEL dental gel      valsartan  (DIOVAN ) 80 MG tablet Take 1 tablet (80 mg total) by mouth daily. 90 tablet 1   amphetamine -dextroamphetamine  (ADDERALL) 10 MG tablet Take 1 tablet (10 mg total) by mouth 3 (three) times daily. 270 tablet 0   busPIRone  (BUSPAR ) 30 MG tablet Take 1 tablet (30 mg total) by mouth 2 (two) times daily. 180 tablet 1   clonazePAM  (KLONOPIN ) 0.5 MG tablet Take 1 tablet (0.5 mg total) by mouth 3 (three) times daily as needed for anxiety. 60 tablet 5   sertraline  (ZOLOFT ) 100 MG tablet Take 2 tablets (200 mg total) by mouth daily. 180 tablet 1   traZODone  (DESYREL ) 150 MG tablet Take 1-2 tablets (150-300 mg total) by mouth daily. 180 tablet 1   No current facility-administered medications for this visit.    Medication Side Effects: None  Allergies:  Allergies  Allergen Reactions   Morphine Other (See Comments)    REACTION: paranoid    Past Medical History:  Diagnosis Date   ADD (attention deficit disorder)    Allergy    Anemia, normocytic normochromic 12/25/2012   Mild, chronic, Hb 11-12 grams at least since 11/2006;  screening studies & endoscopies unremarkable   Anxiety    Back pain    Breast cancer (HCC) 2007   Chronic kidney disease    hx of stage 3 kidney disease    Depression    Diverticulosis of colon (without mention of hemorrhage)    Esophageal reflux    H/O dehydration    Headache    PMH   Heart murmur    History of blood transfusion    History of radiation therapy    Hypercholesterolemia    Hypertension    Irritable bowel syndrome    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   Osteopenia    Ovarian cystic mass 07/07/2015   Personal history of radiation therapy 2008   PONV (postoperative nausea and vomiting)    also had severe itching    Pyloric stenosis    Repeated falls    Shortness of breath dyspnea    walking distances or climbing stairs    Family History  Problem Relation Age of Onset   Miscarriages / [place] Mother    Hearing loss Father    Arthritis Father    Other Father        colon rupture   Diabetes Maternal Aunt    Breast cancer Maternal Aunt    Breast cancer Maternal Aunt        X 3   Breast cancer Maternal Aunt    Cancer Maternal Grandfather    Prostate cancer Maternal Grandfather    Arthritis Paternal Grandmother    Early death Paternal Grandfather    Drug abuse Paternal Grandfather    Pancreatic cancer Cousin     Social History   Socioeconomic History   Marital status: Divorced    Spouse name: Not on file   Number of children: 0   Years of education: Not on file   Highest education level: Not on file  Occupational History   Occupation: Retired Magazine features editor: RETIRED  Tobacco Use   Smoking status: Never   Smokeless tobacco: Never  Vaping Use   Vaping status: Never Used  Substance and Sexual Activity   Alcohol  use: Yes    Alcohol /week: 0.0 standard drinks of alcohol     Comment:  rarely   Drug use: No   Sexual activity: Not Currently  Other Topics Concern   Not on file  Social History  Narrative   She is divorced. No children. She is a retired Insurance account manager.  infertility   Lives alone.   One caffeinated beverage daily.      Social Drivers of Corporate investment banker Strain: Low Risk  (04/14/2023)   Overall Financial Resource Strain (CARDIA)    Difficulty of Paying Living Expenses: Not hard at all  Food Insecurity: No Food Insecurity (04/14/2023)   Hunger Vital Sign    Worried About Running Out of Food in the Last Year: Never true    Ran Out of Food in the Last Year: Never true  Transportation Needs: No Transportation Needs (04/14/2023)   PRAPARE - Administrator, Civil Service (Medical): No    Lack of Transportation (Non-Medical): No  Physical Activity: Inactive (04/14/2023)   Exercise Vital Sign    Days of Exercise per Week: 0 days    Minutes of Exercise per Session: 0 min  Stress: No Stress Concern Present (04/14/2023)   [employer] of Occupational Health - Occupational Stress Questionnaire    Feeling of Stress : Only a little  Social Connections: Moderately Isolated (04/14/2023)   Social Connection and Isolation Panel [NHANES]    Frequency of Communication with Friends and Family: More than three times a week    Frequency of Social Gatherings with Friends and Family: Once a week    Attends Religious Services: 1 to 4 times per year    Active Member of [employer] or Organizations: No    Attends Banker Meetings: Never    Marital Status: Divorced  Catering manager Violence: Not At Risk (04/14/2023)   Humiliation, Afraid, Rape, and Kick questionnaire    Fear of Current or Ex-Partner: No    Emotionally Abused: No    Physically Abused: No    Sexually Abused: No    Past Medical History, Surgical history, Social history, and Family history were reviewed and updated as appropriate.   Lives now with man shes dated for 20 years who moved in due to PD  Please see review of systems for further details on the patient's review from today.    Objective:   Physical Exam:  There were no vitals taken for this visit.  Physical Exam Constitutional:      General: She is not in acute distress.    Appearance: She is well-developed. She is obese.  Genitourinary:    Rectum: Normal.  Musculoskeletal:        General: No deformity.  Neurological:     Mental Status: She is alert and oriented to person, place, and time.     Cranial Nerves: No dysarthria.     Coordination: Coordination normal.  Psychiatric:        Attention and Perception: She is attentive.        Mood and Affect: Mood is anxious. Mood is not depressed. Affect is not labile, blunt, angry, tearful or inappropriate.        Speech: Speech normal. Speech is not rapid and pressured or slurred.        Behavior: Behavior normal.        Thought Content: Thought content normal. Thought content is not delusional. Thought content does not include homicidal or suicidal ideation. Thought content does not include suicidal plan.        Cognition and Memory: Cognition normal.  Judgment: Judgment normal.     Comments: Insight is fair.    sitational depression generally mild. Residual chronic anxiety but not severe.     Lab Review:     Component Value Date/Time   NA 138 02/10/2024 1210   NA 142 01/15/2017 0901   K 3.6 02/10/2024 1210   K 4.2 01/15/2017 0901   CL 101 02/10/2024 1210   CO2 26 02/10/2024 1210   CO2 25 01/15/2017 0901   GLUCOSE 114 (H) 02/10/2024 1210   GLUCOSE 100 01/15/2017 0901   BUN 23 02/10/2024 1210   BUN 22.3 01/15/2017 0901   CREATININE 1.46 (H) 02/10/2024 1210   CREATININE 1.3 (H) 01/15/2017 0901   CALCIUM  9.6 02/10/2024 1210   CALCIUM  9.3 01/15/2017 0901   PROT 6.9 02/10/2024 1210   PROT 6.6 01/15/2017 0901   ALBUMIN 4.3 02/10/2024 1210   ALBUMIN 3.6 01/15/2017 0901   AST 15 02/10/2024 1210   AST 14 01/15/2017 0901   ALT 9 02/10/2024 1210   ALT 10 01/15/2017 0901   ALKPHOS 64 02/10/2024 1210   ALKPHOS 65 01/15/2017 0901   BILITOT  0.3 02/10/2024 1210   BILITOT 0.27 01/15/2017 0901   GFRNONAA >60 10/13/2015 0440   GFRAA >60 10/13/2015 0440       Component Value Date/Time   WBC 6.7 02/10/2024 1210   RBC 3.95 02/10/2024 1210   HGB 11.8 (L) 02/10/2024 1210   HGB 10.1 (L) 01/15/2017 0901   HCT 35.4 (L) 02/10/2024 1210   HCT 31.5 (L) 01/15/2017 0901   PLT 234.0 02/10/2024 1210   PLT 177 01/15/2017 0901   MCV 89.7 02/10/2024 1210   MCV 88.7 01/15/2017 0901   MCH 28.5 01/15/2017 0901   MCH 30.4 10/13/2015 0440   MCHC 33.2 02/10/2024 1210   RDW 13.2 02/10/2024 1210   RDW 14.1 01/15/2017 0901   LYMPHSABS 1.3 02/10/2024 1210   LYMPHSABS 2.4 01/15/2017 0901   MONOABS 0.4 02/10/2024 1210   MONOABS 0.6 01/15/2017 0901   EOSABS 0.1 02/10/2024 1210   EOSABS 0.5 01/15/2017 0901   BASOSABS 0.0 02/10/2024 1210   BASOSABS 0.0 01/15/2017 0901    No results found for: "POCLITH", "LITHIUM"   No results found for: "PHENYTOIN", "PHENOBARB", "VALPROATE", "CBMZ"   .res Assessment: Plan:    Gloria "Clide Dalton" was seen today for follow-up, depression, anxiety, add and sleeping problem.  Diagnoses and all orders for this visit:  Generalized anxiety disorder -     sertraline  (ZOLOFT ) 100 MG tablet; Take 2 tablets (200 mg total) by mouth daily. -     busPIRone  (BUSPAR ) 30 MG tablet; Take 1 tablet (30 mg total) by mouth 2 (two) times daily.  Panic disorder with agoraphobia -     sertraline  (ZOLOFT ) 100 MG tablet; Take 2 tablets (200 mg total) by mouth daily. -     clonazePAM  (KLONOPIN ) 0.5 MG tablet; Take 1 tablet (0.5 mg total) by mouth 3 (three) times daily as needed for anxiety.  Recurrent major depression in full remission (HCC) -     sertraline  (ZOLOFT ) 100 MG tablet; Take 2 tablets (200 mg total) by mouth daily.  Attention deficit hyperactivity disorder (ADHD), combined type -     amphetamine -dextroamphetamine  (ADDERALL) 10 MG tablet; Take 1 tablet (10 mg total) by mouth 3 (three) times daily.  Insomnia due to  mental condition -     traZODone  (DESYREL ) 150 MG tablet; Take 1-2 tablets (150-300 mg total) by mouth daily.  Hoarding disorder     30  min face to face time with patient was spent on counseling and coordination of care. We discussed diagnoses to which should be added hoarding.  Struggling with this ongoing producing a good deal of anxiety..  We discussed the short-term risks associated with benzodiazepines including sedation and increased fall risk among others.  Discussed long-term side effect risk including dependence, potential withdrawal symptoms, and the potential eventual dose-related risk of dementia.  But recent studies from 2020 dispute this association between benzodiazepines and dementia risk. Newer studies in 2020 do not support an association with dementia.  Not ideal to take stimulants and clonazepam  but needs BZ to leave the house.  Fearful.  Adderall helps function. OK  Adderall 10 TID to prolong productivity period which could also help mood. Some avoidance.  Discussed potential benefits, risks, and side effects of stimulants with patient to include increased heart rate, palpitations, insomnia, increased anxiety, increased irritability, or decreased appetite.  Instructed patient to contact office if experiencing any significant tolerability issues.  17 min counseling; Supportive therapy dealing with broken relationship and loneliness.  Disc hoarding and pushing herself through procrastination and hoarding.  She is making some progress since here. Encourage activity and return to [employer]. Encouraget consult with downsizing consultant of if that does not work then a therpist with expertise hoarding. Disc recent govt study that 7% of adults over 70 have HD. 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  Including journaling about prior principal.   Disc therapeutic letter writing.   No med changes indicated.   Trazodone  helps sleep. 300 HS Continue sertraline   200 mg daily. Buspirone  30 BID  Adderall 10 TID  Continue prn clonazepam .  Benefit and tolerating meds.  FU 6-9  mos  Nori Beat, MD, DFAPA   Please see After Visit Summary for patient specific instructions.  Future Appointments  Date Time Provider Department Center  04/21/2024 11:20 AM [facility] ANNUAL WELLNESS VISIT 1 [facility]  08/12/2024 10:30 AM Christel Cousins, MD [facility]    No orders of the defined types were placed in this encounter.    -------------------------------

## 2024-04-21 ENCOUNTER — Ambulatory Visit (INDEPENDENT_AMBULATORY_CARE_PROVIDER_SITE_OTHER)

## 2024-04-21 VITALS — Ht 60.0 in | Wt 136.0 lb

## 2024-04-21 DIAGNOSIS — Z0000 Encounter for general adult medical examination without abnormal findings: Secondary | ICD-10-CM

## 2024-04-21 NOTE — Patient Instructions (Signed)
 Gloria Fields , Thank you for taking time out of your busy schedule to complete your Annual Wellness Visit with me. I enjoyed our conversation and look forward to speaking with you again next year. I, as well as your care team,  appreciate your ongoing commitment to your health goals. Please review the following plan we discussed and let me know if I can assist you in the future. Your Game plan/ To Do List    Referrals: If you haven't heard from the office you've been referred to, please reach out to them at the phone provided.   Follow up Visits: Next Medicare AWV with our clinical staff: 04/26/25   Have you seen your provider in the last 6 months (3 months if uncontrolled diabetes)? Yes Next Office Visit with your provider: 08/12/24  Clinician Recommendations:  Each day, aim for 6 glasses of water, plenty of protein in your diet and try to get up and walk/ stretch every hour for 5-10 minutes at a time.        This is a list of the screening recommended for you and due dates:  Health Maintenance  Topic Date Due   DTaP/Tdap/Td vaccine (1 - Tdap) Never done   Zoster (Shingles) Vaccine (1 of 2) Never done   Pneumonia Vaccine (1 of 1 - PCV) Never done   COVID-19 Vaccine (5 - 2024-25 season) 07/27/2023   Medicare Annual Wellness Visit  04/13/2024   Flu Shot  06/25/2024   DEXA scan (bone density measurement)  Completed   Hepatitis C Screening  Completed   HPV Vaccine  Aged Out   Meningitis B Vaccine  Aged Out   Colon Cancer Screening  Discontinued    Advanced directives: (Declined) Advance directive discussed with you today. Even though you declined this today, please call our office should you change your mind, and we can give you the proper paperwork for you to fill out. Advance Care Planning is important because it:  [x]  Makes sure you receive the medical care that is consistent with your values, goals, and preferences  [x]  It provides guidance to your family and loved ones and reduces  their decisional burden about whether or not they are making the right decisions based on your wishes.  Follow the link provided in your after visit summary or read over the paperwork we have mailed to you to help you started getting your Advance Directives in place. If you need assistance in completing these, please reach out to us  so that we can help you!  See attachments for Preventive Care and Fall Prevention Tips.

## 2024-04-21 NOTE — Progress Notes (Signed)
 Subjective:   Gloria Fields is a 78 y.o. who presents for a Medicare Wellness preventive visit.  As a reminder, Annual Wellness Visits don't include a physical exam, and some assessments may be limited, especially if this visit is performed virtually. We may recommend an in-person follow-up visit with your provider if needed.  Visit Complete: Virtual I connected with  Gloria Fields on 04/21/24 by a audio enabled telemedicine application and verified that I am speaking with the correct person using two identifiers.  Patient Location: Home  Provider Location: Home Office  I discussed the limitations of evaluation and management by telemedicine. The patient expressed understanding and agreed to proceed.  Vital Signs: Because this visit was a virtual/telehealth visit, some criteria may be missing or patient reported. Any vitals not documented were not able to be obtained and vitals that have been documented are patient reported.  VideoDeclined- This patient declined Librarian, academic. Therefore the visit was completed with audio only.  Persons Participating in Visit: Patient.  AWV Questionnaire: No: Patient Medicare AWV questionnaire was not completed prior to this visit.  Cardiac Risk Factors include: advanced age (>7men, >68 women);hypertension     Objective:     Today's Vitals   04/21/24 1117  Weight: 136 lb (61.7 kg)  Height: 5' (1.524 m)   Body mass index is 26.56 kg/m.     04/21/2024   11:22 AM 04/14/2023    2:43 PM 12/13/2022    1:28 PM 06/19/2016    9:16 AM 10/12/2015    5:30 PM 10/09/2015    8:23 AM 07/19/2015    9:35 AM  Advanced Directives  Does Patient Have a Medical Advance Directive? No Yes No No No Yes Yes  Type of Furniture conservator/restorer;Living will    Healthcare Power of [employer] Power of Attorney  Does patient want to make changes to medical advance directive?      No - Patient  declined No - Patient declined  Copy of Healthcare Power of Attorney in Chart?  No - copy requested    No - copy requested No - copy requested  Would patient like information on creating a medical advance directive? No - Patient declined  No - Patient declined Yes - Educational materials given No - patient declined information      Current Medications (verified) Outpatient Encounter Medications as of 04/21/2024  Medication Sig   amphetamine -dextroamphetamine  (ADDERALL) 10 MG tablet Take 1 tablet (10 mg total) by mouth 3 (three) times daily.   antiseptic oral rinse (BIOTENE) LIQD 15 mLs by Mouth Rinse route as needed for dry mouth (uses when she brushes her teeth).   busPIRone  (BUSPAR ) 30 MG tablet Take 1 tablet (30 mg total) by mouth 2 (two) times daily.   clonazePAM  (KLONOPIN ) 0.5 MG tablet Take 1 tablet (0.5 mg total) by mouth 3 (three) times daily as needed for anxiety.   Omega-3 Fatty Acids (FISH OIL) 1000 MG CAPS Take 1,000 mg by mouth 2 (two) times daily.   omeprazole  (PRILOSEC) 40 MG capsule Take 1 capsule (40 mg total) by mouth daily.   PREVIDENT  5000 DRY MOUTH 1.1 % GEL dental gel    sertraline  (ZOLOFT ) 100 MG tablet Take 2 tablets (200 mg total) by mouth daily.   traZODone  (DESYREL ) 150 MG tablet Take 1-2 tablets (150-300 mg total) by mouth daily.   valsartan  (DIOVAN ) 80 MG tablet Take 1 tablet (80 mg total) by mouth daily.  VITAMIN D  PO Take by mouth.   [DISCONTINUED] clotrimazole -betamethasone  (LOTRISONE ) cream APPLY TO AFFECTED AREA TOPICALLY 3 TIMES A DAY FOR 3 MONTHS   [DISCONTINUED] cycloSPORINE  (RESTASIS ) 0.05 % ophthalmic emulsion 1 drop 2 (two) times daily.   No facility-administered encounter medications on file as of 04/21/2024.    Allergies (verified) Morphine   History: Past Medical History:  Diagnosis Date   ADD (attention deficit disorder)    Allergy    Anemia, normocytic normochromic 12/25/2012   Mild, chronic, Hb 11-12 grams at least since 11/2006; screening  studies & endoscopies unremarkable   Anxiety    Back pain    Breast cancer (HCC) 2007   Chronic kidney disease    hx of stage 3 kidney disease    Depression    Diverticulosis of colon (without mention of hemorrhage)    Esophageal reflux    H/O dehydration    Headache    PMH   Heart murmur    History of blood transfusion    History of radiation therapy    Hypercholesterolemia    Hypertension    Irritable bowel syndrome    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   Osteopenia    Ovarian cystic mass 07/07/2015   Personal history of radiation therapy 2008   PONV (postoperative nausea and vomiting)    also had severe itching    Pyloric stenosis    Repeated falls    Shortness of breath dyspnea    walking distances or climbing stairs   Past Surgical History:  Procedure Laterality Date   APPENDECTOMY  2007   perforated appendicitis   BREAST LUMPECTOMY  2007   right   CATARACT EXTRACTION     COLONOSCOPY     COLONOSCOPY WITH PROPOFOL  N/A 07/07/2015   Procedure: COLONOSCOPY WITH PROPOFOL ;  Surgeon: Kenney Peacemaker, MD;  Location[facility];  Service: Endoscopy;  Laterality: N/A;  NEED ULTRA SLIM COLONOSCOPE PLEASE, possible pyloric dilation   ESOPHAGOGASTRODUODENOSCOPY     ESOPHAGOGASTRODUODENOSCOPY N/A 07/07/2015   Procedure: ESOPHAGOGASTRODUODENOSCOPY (EGD);  Surgeon: Kenney Peacemaker, MD;  Location: [facility] ENDOSCOPY;  Service: Endoscopy;  Laterality: N/A;  POSSIBLE PYLORIC DILATION   HERNIA REPAIR Left    L and ventral   LAPAROSCOPIC BILATERAL SALPINGO OOPHERECTOMY Bilateral 10/12/2015   Procedure: LAPAROSCOPIC  LEFT SALPINGO OOPHORECTOMY AND LYSIS OF ADHESIONS ;  Surgeon: Alphonso Aschoff, MD;  Location: [facility] ORS;  Service: Gynecology;  Laterality: Bilateral;   LAPAROSCOPIC LYSIS OF ADHESIONS  10/29/2012   Procedure: LAPAROSCOPIC LYSIS OF ADHESIONS;  Surgeon: Azucena Bollard, MD;  Location: [facility] ORS;  Service: General;;   UPPER GASTROINTESTINAL  ENDOSCOPY  Multiple   UTERINE FIBROID SURGERY     with subsequent scar tissue removal   VENTRAL HERNIA REPAIR  10/29/2012   Procedure: LAPAROSCOPIC VENTRAL HERNIA;  Surgeon: Azucena Bollard, MD;  Location: [facility];  Service: General;  Laterality: N/A;  LAPRASCOPIC  ASSISTED OPEN VENTRAL HERNIA REPAIR, LAPRASCOPIC LYSIS OF ADHESIONS    VENTRAL HERNIA REPAIR N/A 10/12/2015   Procedure: LAPAROSCOPIC VENTRAL HERNIA REPAIR ;  Surgeon: Jacolyn Matar, MD;  Location: [facility];  Service: General;  Laterality: N/A;   WISDOM TOOTH EXTRACTION     Family History  Problem Relation Age of Onset   Miscarriages / [place] Mother    Hearing loss Father    Arthritis Father    Other Father        colon rupture   Diabetes Maternal Aunt    Breast cancer  Maternal Aunt    Breast cancer Maternal Aunt        X 3   Breast cancer Maternal Aunt    Cancer Maternal Grandfather    Prostate cancer Maternal Grandfather    Arthritis Paternal Grandmother    Early death Paternal Grandfather    Drug abuse Paternal Grandfather    Pancreatic cancer Cousin    Social History   Socioeconomic History   Marital status: Divorced    Spouse name: Not on file   Number of children: 0   Years of education: Not on file   Highest education level: Not on file  Occupational History   Occupation: Retired Magazine features editor: RETIRED  Tobacco Use   Smoking status: Never   Smokeless tobacco: Never  Vaping Use   Vaping status: Never Used  Substance and Sexual Activity   Alcohol  use: Yes    Alcohol /week: 0.0 standard drinks of alcohol     Comment: rarely   Drug use: No   Sexual activity: Not Currently  Other Topics Concern   Not on file  Social History Narrative   She is divorced. No children. She is a retired Insurance account manager.  infertility   Lives alone.   One caffeinated beverage daily.      Social Drivers of Corporate investment banker Strain: Low Risk  (04/14/2023)   Overall Financial Resource Strain (CARDIA)     Difficulty of Paying Living Expenses: Not hard at all  Food Insecurity: No Food Insecurity (04/14/2023)   Hunger Vital Sign    Worried About Running Out of Food in the Last Year: Never true    Ran Out of Food in the Last Year: Never true  Transportation Needs: No Transportation Needs (04/14/2023)   PRAPARE - Administrator, Civil Service (Medical): No    Lack of Transportation (Non-Medical): No  Physical Activity: Inactive (04/21/2024)   Exercise Vital Sign    Days of Exercise per Week: 0 days    Minutes of Exercise per Session: 0 min  Stress: No Stress Concern Present (04/21/2024)   [employer] of Occupational Health - Occupational Stress Questionnaire    Feeling of Stress : Only a little  Social Connections: Moderately Integrated (04/21/2024)   Social Connection and Isolation Panel [NHANES]    Frequency of Communication with Friends and Family: More than three times a week    Frequency of Social Gatherings with Friends and Family: More than three times a week    Attends Religious Services: 1 to 4 times per year    Active Member of [employer] or Organizations: Yes    Attends Banker Meetings: 1 to 4 times per year    Marital Status: Divorced    Tobacco Counseling Counseling given: Not Answered    Clinical Intake:  Pre-visit preparation completed: Yes  Pain : No/denies pain     BMI - recorded: 26.56 Nutritional Status: BMI 25 -29 Overweight Nutritional Risks: None Diabetes: No  No results found for: "HGBA1C"   How often do you need to have someone help you when you read instructions, pamphlets, or other written materials from your doctor or pharmacy?: 1 - Never  Interpreter Needed?: No  Information entered by :: Lamont Pilsner, LPN   Activities of Daily Living     04/21/2024   11:18 AM  In your present state of health, do you have any difficulty performing the following activities:  Hearing? 0  Vision? 0  Difficulty concentrating or  making decisions? 0  Walking or climbing stairs? 0  Dressing or bathing? 0  Doing errands, shopping? 0  Preparing Food and eating ? N  Using the Toilet? N  In the past six months, have you accidently leaked urine? Y  Comment adult diapers  Do you have problems with loss of bowel control? Y  Comment adult diapers  Managing your Medications? N  Managing your Finances? N  Housekeeping or managing your Housekeeping? N    Patient Care Team: Christel Cousins, MD as PCP - General (Family Medicine) Ccs, Md, MD (General Surgery)  Indicate any recent Medical Services you may have received from other than [facility] providers in the past year (date may be approximate).     Assessment:    This is a routine wellness examination for Kera.  Hearing/Vision screen Hearing Screening - Comments:: Pt denies any hearing issues  Vision Screening - Comments:: Wears rx glasses - up to date with routine eye exams with [facility]     Goals Addressed             This Visit's Progress    Patient Stated       Increase energy and stamina       Depression Screen     04/21/2024   11:23 AM 02/10/2024   11:34 AM 04/14/2023    2:41 PM 04/08/2023    2:00 PM 11/05/2022   10:53 AM  PHQ 2/9 Scores  PHQ - 2 Score 1 4 2 2 2   PHQ- 9 Score   3 4 7     Fall Risk     04/21/2024   11:22 AM 04/14/2023    2:44 PM 04/08/2023    1:59 PM 11/05/2022   10:53 AM  Fall Risk   Falls in the past year? 0 1 1 1   Number falls in past yr: 0 1 0 1  Injury with Fall? 0 1 1 1   Comment  two black eye    Risk for fall due to : Impaired balance/gait;Impaired mobility Impaired balance/gait;Impaired vision;Impaired mobility Impaired balance/gait Impaired balance/gait  Follow up Falls prevention discussed Falls prevention discussed Falls prevention discussed Falls prevention discussed;Education provided    MEDICARE RISK AT HOME:  Medicare Risk at Home Any stairs in or around the home?: Yes If so, are there any without  handrails?: No Home free of loose throw rugs in walkways, pet beds, electrical cords, etc?: Yes Adequate lighting in your home to reduce risk of falls?: Yes Life alert?: No Use of a cane, walker or w/c?: Yes Grab bars in the bathroom?: No Shower chair or bench in shower?: Yes Elevated toilet seat or a handicapped toilet?: No  TIMED UP AND GO:  Was the test performed?  No  Cognitive Function: 6CIT completed        04/21/2024   11:25 AM 04/14/2023    2:46 PM  6CIT Screen  What Year? 0 points 0 points  What month? 0 points 0 points  What time? 0 points 0 points  Count back from 20 0 points 0 points  Months in reverse 0 points 0 points  Repeat phrase 0 points 0 points  Total Score 0 points 0 points    Immunizations Immunization History  Administered Date(s) Administered   Moderna Sars-Covid-2 Vaccination 01/08/2020, 02/05/2020, 10/25/2020, 10/25/2022    Screening Tests Health Maintenance  Topic Date Due   DTaP/Tdap/Td (1 - Tdap) Never done   Zoster Vaccines- Shingrix (1 of 2) Never done   Pneumonia  Vaccine 12+ Years old (1 of 1 - PCV) Never done   COVID-19 Vaccine (5 - 2024-25 season) 07/27/2023   INFLUENZA VACCINE  06/25/2024   Medicare Annual Wellness (AWV)  04/21/2025   DEXA SCAN  Completed   Hepatitis C Screening  Completed   HPV VACCINES  Aged Out   Meningococcal B Vaccine  Aged Out   Colonoscopy  Discontinued    Health Maintenance  Health Maintenance Due  Topic Date Due   DTaP/Tdap/Td (1 - Tdap) Never done   Zoster Vaccines- Shingrix (1 of 2) Never done   Pneumonia Vaccine 50+ Years old (1 of 1 - PCV) Never done   COVID-19 Vaccine (5 - 2024-25 season) 07/27/2023   Health Maintenance Items Addressed: See Nurse Notes  Additional Screening:  Vision Screening: Recommended annual ophthalmology exams for early detection of glaucoma and other disorders of the eye.  Dental Screening: Recommended annual dental exams for proper oral hygiene  Community  Resource Referral / Chronic Care Management: CRR required this visit?  No   CCM required this visit?  No   Plan:    I have personally reviewed and noted the following in the patient's chart:   Medical and social history Use of alcohol , tobacco or illicit drugs  Current medications and supplements including opioid prescriptions. Patient is not currently taking opioid prescriptions. Functional ability and status Nutritional status Physical activity Advanced directives List of other physicians Hospitalizations, surgeries, and ER visits in previous 12 months Vitals Screenings to include cognitive, depression, and falls Referrals and appointments  In addition, I have reviewed and discussed with patient certain preventive protocols, quality metrics, and best practice recommendations. A written personalized care plan for preventive services as well as general preventive health recommendations were provided to patient.   Bruno Capri, LPN   7/82/9562   After Visit Summary: (MyChart) Due to this being a telephonic visit, the after visit summary with patients personalized plan was offered to patient via MyChart   Notes: Nothing significant to report at this time.

## 2024-04-26 ENCOUNTER — Telehealth: Payer: Self-pay | Admitting: Psychiatry

## 2024-04-26 NOTE — Telephone Encounter (Signed)
 Downsizing consultant Magdaleno Schooling [phone]

## 2024-04-26 NOTE — Telephone Encounter (Signed)
 Pt lvm that  at her last visit dr. Toi Foster gave her a name of of a person who could help with hoarding and organization. She has lost that name and would like to have a call back with the information. Her number is [phone]

## 2024-04-26 NOTE — Telephone Encounter (Signed)
 Provided name/number to patient.

## 2024-04-26 NOTE — Telephone Encounter (Signed)
 At 5/27 visit:  [employer] consult with downsizing consultant of if that does not work then a therpist with expertise hoarding.   Pt said you gave her a name of someone, but she has lost it. Asking again for recommendation.

## 2024-06-11 ENCOUNTER — Encounter: Payer: Self-pay | Admitting: Advanced Practice Midwife

## 2024-07-07 DIAGNOSIS — H5203 Hypermetropia, bilateral: Secondary | ICD-10-CM | POA: Diagnosis not present

## 2024-07-07 DIAGNOSIS — H52223 Regular astigmatism, bilateral: Secondary | ICD-10-CM | POA: Diagnosis not present

## 2024-07-07 DIAGNOSIS — Z961 Presence of intraocular lens: Secondary | ICD-10-CM | POA: Diagnosis not present

## 2024-07-07 DIAGNOSIS — H43813 Vitreous degeneration, bilateral: Secondary | ICD-10-CM | POA: Diagnosis not present

## 2024-07-07 DIAGNOSIS — H524 Presbyopia: Secondary | ICD-10-CM | POA: Diagnosis not present

## 2024-07-07 DIAGNOSIS — H04123 Dry eye syndrome of bilateral lacrimal glands: Secondary | ICD-10-CM | POA: Diagnosis not present

## 2024-08-12 ENCOUNTER — Ambulatory Visit: Admitting: Family Medicine

## 2024-08-17 ENCOUNTER — Encounter: Payer: Self-pay | Admitting: Family Medicine

## 2024-08-17 ENCOUNTER — Ambulatory Visit: Admitting: Family Medicine

## 2024-08-17 ENCOUNTER — Telehealth: Payer: Self-pay | Admitting: Family Medicine

## 2024-08-17 VITALS — BP 130/70 | HR 86 | Temp 98.4°F | Resp 18 | Ht 60.0 in | Wt 135.1 lb

## 2024-08-17 DIAGNOSIS — R1084 Generalized abdominal pain: Secondary | ICD-10-CM

## 2024-08-17 DIAGNOSIS — I10 Essential (primary) hypertension: Secondary | ICD-10-CM | POA: Diagnosis not present

## 2024-08-17 DIAGNOSIS — N289 Disorder of kidney and ureter, unspecified: Secondary | ICD-10-CM | POA: Diagnosis not present

## 2024-08-17 DIAGNOSIS — K219 Gastro-esophageal reflux disease without esophagitis: Secondary | ICD-10-CM

## 2024-08-17 DIAGNOSIS — E559 Vitamin D deficiency, unspecified: Secondary | ICD-10-CM | POA: Diagnosis not present

## 2024-08-17 DIAGNOSIS — E7849 Other hyperlipidemia: Secondary | ICD-10-CM | POA: Diagnosis not present

## 2024-08-17 DIAGNOSIS — R7301 Impaired fasting glucose: Secondary | ICD-10-CM | POA: Diagnosis not present

## 2024-08-17 DIAGNOSIS — R198 Other specified symptoms and signs involving the digestive system and abdomen: Secondary | ICD-10-CM

## 2024-08-17 DIAGNOSIS — F411 Generalized anxiety disorder: Secondary | ICD-10-CM

## 2024-08-17 DIAGNOSIS — D649 Anemia, unspecified: Secondary | ICD-10-CM | POA: Diagnosis not present

## 2024-08-17 LAB — COMPREHENSIVE METABOLIC PANEL WITH GFR
ALT: 12 U/L (ref 0–35)
AST: 17 U/L (ref 0–37)
Albumin: 4.2 g/dL (ref 3.5–5.2)
Alkaline Phosphatase: 81 U/L (ref 39–117)
BUN: 21 mg/dL (ref 6–23)
CO2: 26 meq/L (ref 19–32)
Calcium: 9.3 mg/dL (ref 8.4–10.5)
Chloride: 106 meq/L (ref 96–112)
Creatinine, Ser: 1.33 mg/dL — ABNORMAL HIGH (ref 0.40–1.20)
GFR: 38.74 mL/min — ABNORMAL LOW (ref >60.00)
Glucose, Bld: 109 mg/dL — ABNORMAL HIGH (ref 70–99)
Potassium: 3.9 meq/L (ref 3.5–5.1)
Sodium: 141 meq/L (ref 135–145)
Total Bilirubin: 0.3 mg/dL (ref 0.2–1.2)
Total Protein: 7 g/dL (ref 6.0–8.3)

## 2024-08-17 LAB — LIPID PANEL
Cholesterol: 208 mg/dL — ABNORMAL HIGH (ref 0–200)
HDL: 60.3 mg/dL (ref >39.00)
LDL Cholesterol: 124 mg/dL — ABNORMAL HIGH (ref 0–99)
NonHDL: 148.14
Total CHOL/HDL Ratio: 3
Triglycerides: 119 mg/dL (ref 0.0–149.0)
VLDL: 23.8 mg/dL (ref 0.0–40.0)

## 2024-08-17 LAB — CBC WITH DIFFERENTIAL/PLATELET
Basophils Absolute: 0 K/uL (ref 0.0–0.1)
Basophils Relative: 0.7 % (ref 0.0–3.0)
Eosinophils Absolute: 0.3 K/uL (ref 0.0–0.7)
Eosinophils Relative: 4.6 % (ref 0.0–5.0)
HCT: 29.5 % — ABNORMAL LOW (ref 36.0–46.0)
Hemoglobin: 9.7 g/dL — ABNORMAL LOW (ref 12.0–15.0)
Lymphocytes Relative: 19.4 % (ref 12.0–46.0)
Lymphs Abs: 1.2 K/uL (ref 0.7–4.0)
MCHC: 32.8 g/dL (ref 30.0–36.0)
MCV: 90.2 fl (ref 78.0–100.0)
Monocytes Absolute: 0.5 K/uL (ref 0.1–1.0)
Monocytes Relative: 7.7 % (ref 3.0–12.0)
Neutro Abs: 4.2 K/uL (ref 1.4–7.7)
Neutrophils Relative %: 67.6 % (ref 43.0–77.0)
Platelets: 174 K/uL (ref 150.0–400.0)
RBC: 3.27 Mil/uL — ABNORMAL LOW (ref 3.87–5.11)
RDW: 13.6 % (ref 11.5–15.5)
WBC: 6.2 K/uL (ref 4.0–10.5)

## 2024-08-17 LAB — IBC + FERRITIN
Ferritin: 94.1 ng/mL (ref 10.0–291.0)
Iron: 59 ug/dL (ref 42–145)
Saturation Ratios: 19.9 % — ABNORMAL LOW (ref 20.0–50.0)
TIBC: 296.8 ug/dL (ref 250.0–450.0)
Transferrin: 212 mg/dL (ref 212.0–360.0)

## 2024-08-17 LAB — HEMOGLOBIN A1C: Hgb A1c MFr Bld: 6.3 % (ref 4.6–6.5)

## 2024-08-17 LAB — VITAMIN D 25 HYDROXY (VIT D DEFICIENCY, FRACTURES): VITD: 36.19 ng/mL (ref 30.00–100.00)

## 2024-08-17 LAB — VITAMIN B12: Vitamin B-12: 959 pg/mL — ABNORMAL HIGH (ref 211–911)

## 2024-08-17 LAB — TSH: TSH: 2.89 u[IU]/mL (ref 0.35–5.50)

## 2024-08-17 MED ORDER — OMEPRAZOLE 40 MG PO CPDR
40.0000 mg | DELAYED_RELEASE_CAPSULE | Freq: Every day | ORAL | 1 refills | Status: AC
Start: 2024-08-17 — End: ?

## 2024-08-17 MED ORDER — VALSARTAN 80 MG PO TABS: 80.0000 mg | ORAL_TABLET | Freq: Every day | ORAL | 1 refills | Status: AC

## 2024-08-17 NOTE — Telephone Encounter (Unsigned)
 Copied from CRM #[id number]. Topic: Referral - Status >> Aug 17, 2024  3:11 PM Maisie C wrote: Reason for CRM: Candelaria from the [facility] called and stated that the patient does not have an authorization on file for her upcoming appt on 08/20/24 for a CT Scan. Please advise at [phone]

## 2024-08-17 NOTE — Patient Instructions (Signed)

## 2024-08-17 NOTE — Progress Notes (Signed)
 Subjective:     Patient ID: Gloria Fields, female    DOB: [birth year], 78 y.o.   MRN: [id number]  Chief Complaint  Patient presents with   Medical Management of Chronic Issues    6 month follow-up on htn Fasting     HPI Discussed the use of AI scribe software for clinical note transcription with the patient, who gave verbal consent to proceed.  History of Present Illness Gloria Fields is a 78 year old female who presents with fatigue and urinary urgency.  She experiences significant fatigue and lack of energy, describing a cyclical pattern where she has energy for two weeks followed by two weeks of fatigue. She also mentions procrastination and difficulty focusing, which she attributes to her ADD. She has hired someone to help declutter her home, indicating a change from her last visit.  She reports urinary urgency, with frequent urination during the day, especially in the mornings, but not often at night. This has worsened over the past several months. No burning, straining, or feeling of incomplete bladder emptying. She drinks water with fruit-flavored packets, which are not caffeinated or alcoholic.  She experiences intermittent left-sided abdominal pain and notes a history of previous ovary removal; she wonders if the pain could be related to scar tissue. The pain is described as achy and not severe, occurring sporadically without a specific trigger, and lasting for a short duration. No nausea, vomiting, diarrhea, or constipation associated with the pain.  She is currently taking valsartan  80 mg for hypertension but does not regularly check her blood pressure at home due to a misplaced blood pressure machine. She experiences occasional shortness of breath and frustration with herself. No chest pain, dizziness, or headache.  She is taking omeprazole  40 mg once daily for reflux, which she states is mostly controlled. No symptoms of food getting stuck, bleeding,  or dark stools.    There are no preventive care reminders to display for this patient.   Past Medical History:  Diagnosis Date   ADD (attention deficit disorder)    Allergy    Anemia, normocytic normochromic 12/25/2012   Mild, chronic, Hb 11-12 grams at least since 11/2006; screening studies & endoscopies unremarkable   Anxiety    Back pain    Breast cancer (HCC) 2007   Chronic kidney disease    hx of stage 3 kidney disease    Depression    Diverticulosis of colon (without mention of hemorrhage)    Esophageal reflux    H/O dehydration    Headache    PMH   Heart murmur    History of blood transfusion    History of radiation therapy    Hypercholesterolemia    Hypertension    Irritable bowel syndrome    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   Osteopenia    Ovarian cystic mass 07/07/2015   Personal history of radiation therapy 2008   PONV (postoperative nausea and vomiting)    also had severe itching    Pyloric stenosis    Repeated falls    Shortness of breath dyspnea    walking distances or climbing stairs    Past Surgical History:  Procedure Laterality Date   APPENDECTOMY  2007   perforated appendicitis   BREAST LUMPECTOMY  2007   right   CATARACT EXTRACTION     COLONOSCOPY     COLONOSCOPY WITH PROPOFOL  N/A 07/07/2015   Procedure: COLONOSCOPY WITH PROPOFOL ;  Surgeon: Lupita FORBES Commander,  MD;  Location: [facility];  Service: Endoscopy;  Laterality: N/A;  NEED ULTRA SLIM COLONOSCOPE PLEASE, possible pyloric dilation   ESOPHAGOGASTRODUODENOSCOPY     ESOPHAGOGASTRODUODENOSCOPY N/A 07/07/2015   Procedure: ESOPHAGOGASTRODUODENOSCOPY (EGD);  Surgeon: Lupita FORBES Commander, MD;  Location: [facility] ENDOSCOPY;  Service: Endoscopy;  Laterality: N/A;  POSSIBLE PYLORIC DILATION   HERNIA REPAIR Left    L and ventral   LAPAROSCOPIC BILATERAL SALPINGO OOPHERECTOMY Bilateral 10/12/2015   Procedure: LAPAROSCOPIC  LEFT SALPINGO OOPHORECTOMY AND LYSIS OF  ADHESIONS ;  Surgeon: Maurilio Ship, MD;  Location: [facility];  Service: Gynecology;  Laterality: Bilateral;   LAPAROSCOPIC LYSIS OF ADHESIONS  10/29/2012   Procedure: LAPAROSCOPIC LYSIS OF ADHESIONS;  Surgeon: Donnice KATHEE Lunger, MD;  Location: [facility];  Service: General;;   UPPER GASTROINTESTINAL ENDOSCOPY  Multiple   UTERINE FIBROID SURGERY     with subsequent scar tissue removal   VENTRAL HERNIA REPAIR  10/29/2012   Procedure: LAPAROSCOPIC VENTRAL HERNIA;  Surgeon: Donnice KATHEE Lunger, MD;  Location: [facility];  Service: General;  Laterality: N/A;  LAPRASCOPIC  ASSISTED OPEN VENTRAL HERNIA REPAIR, LAPRASCOPIC LYSIS OF ADHESIONS    VENTRAL HERNIA REPAIR N/A 10/12/2015   Procedure: LAPAROSCOPIC VENTRAL HERNIA REPAIR ;  Surgeon: Donnice Lunger, MD;  Location: [facility];  Service: General;  Laterality: N/A;   WISDOM TOOTH EXTRACTION       Current Outpatient Medications:    amphetamine -dextroamphetamine  (ADDERALL) 10 MG tablet, Take 1 tablet (10 mg total) by mouth 3 (three) times daily., Disp: 270 tablet, Rfl: 0   antiseptic oral rinse (BIOTENE) LIQD, 15 mLs by Mouth Rinse route as needed for dry mouth (uses when she brushes her teeth)., Disp: , Rfl:    busPIRone  (BUSPAR ) 30 MG tablet, Take 1 tablet (30 mg total) by mouth 2 (two) times daily., Disp: 180 tablet, Rfl: 1   clonazePAM  (KLONOPIN ) 0.5 MG tablet, Take 1 tablet (0.5 mg total) by mouth 3 (three) times daily as needed for anxiety., Disp: 60 tablet, Rfl: 5   omeprazole  (PRILOSEC) 40 MG capsule, Take 1 capsule (40 mg total) by mouth daily., Disp: 90 capsule, Rfl: 1   sertraline  (ZOLOFT ) 100 MG tablet, Take 2 tablets (200 mg total) by mouth daily., Disp: 180 tablet, Rfl: 1   traZODone  (DESYREL ) 150 MG tablet, Take 1-2 tablets (150-300 mg total) by mouth daily., Disp: 180 tablet, Rfl: 1   valsartan  (DIOVAN ) 80 MG tablet, Take 1 tablet (80 mg total) by mouth daily., Disp: 90 tablet, Rfl: 1   VITAMIN D  PO, Take by mouth., Disp: , Rfl:    Omega-3 Fatty Acids  (FISH OIL) 1000 MG CAPS, Take 1,000 mg by mouth 2 (two) times daily. (Patient not taking: Reported on 08/17/2024), Disp: , Rfl:    PREVIDENT  5000 DRY MOUTH 1.1 % GEL dental gel, , Disp: , Rfl:   Allergies  Allergen Reactions   Morphine Other (See Comments)    REACTION: paranoid   ROS neg/noncontributory except as noted HPI/below      Objective:     BP 130/70 (BP Location: Left Arm, Patient Position: Sitting, Cuff Size: Normal)   Pulse 86   Temp 98.4 F (36.9 C) (Temporal)   Resp 18   Ht 5' (1.524 m)   Wt 135 lb 2 oz (61.3 kg)   SpO2 96%   BMI 26.39 kg/m  Wt Readings from Last 3 Encounters:  08/17/24 135 lb 2 oz (61.3 kg)  04/21/24 136 lb (61.7 kg)  02/10/24 136 lb 4 oz (61.8 kg)  Physical Exam   Gen: WDWN NAD HEENT: NCAT, conjunctiva not injected, sclera nonicteric NECK:  supple, no thyromegaly, no nodes, no carotid bruits CARDIAC: RRR, S1S2+, 1/6 murmur. DP 2+B LUNGS: CTAB. No wheezes ABDOMEN:  BS+, soft, NTND, No HSM, firmness Labd.  Sl tender EXT:  no edema MSK: no gross abnormalities.  NEURO: A&O x3.  CN II-XII intact.  PSYCH: normal mood. Good eye contact     Assessment & Plan:  Primary hypertension  Gastroesophageal reflux disease without esophagitis  Other hyperlipidemia  Anemia, normocytic normochromic  Vitamin D  deficiency  Generalized anxiety disorder  Abnormal kidney function  Impaired fasting glucose  Assessment and Plan Assessment & Plan Essential hypertension   Blood pressure was elevated upon arrival, likely due to anxiety, but normalized after rest. She does not monitor blood pressure at home due to lack of equipment. Encourage obtaining a new blood pressure cuff for home monitoring. Continue valsartan  80mg  daily  Generalized anxiety disorder and depression   She reports lack of energy and motivation with cyclical energy levels. Under psychiatric care, she has discussed these symptoms. No current exercise routine but plans to return  to [employer] program. Encourage participation in social activities and exercise, such as Silver Sneakers.  Gastroesophageal reflux disease (GERD)   Symptoms are mostly controlled with omeprazole  40 mg daily. No reports of dysphagia, bleeding, or melena. Refill omeprazole  40 mg once daily.  Overactive bladder symptoms   Increased frequency of urination during the day, especially in the mornings. No nocturia, dysuria, or straining. Symptoms are not significantly bothersome. Discussed potential side effects of medications, such as dry mouth and eyes. Monitor symptoms and consider lifestyle modifications, such as reducing flavored water intake.  Left lower quadrant abdominal pain likely due to scar tissue   Intermittent left lower quadrant abdominal pain, likely related to scar tissue from previous surgeries. Pain is not severe and does not cause nausea or vomiting. Consideration of imaging to rule out other causes. Order abdominal scan at [facility] on [address].  HLD-not want meds. Not working on diet/exercise Elevated fasting glucose-check A1C    Return in about 6 months (around 02/14/2025) for HTN.  [name], MD

## 2024-08-18 ENCOUNTER — Other Ambulatory Visit: Payer: Self-pay | Admitting: *Deleted

## 2024-08-18 ENCOUNTER — Other Ambulatory Visit: Payer: Self-pay

## 2024-08-18 ENCOUNTER — Ambulatory Visit: Payer: Self-pay | Admitting: Family Medicine

## 2024-08-18 ENCOUNTER — Telehealth: Payer: Self-pay | Admitting: Psychiatry

## 2024-08-18 DIAGNOSIS — D649 Anemia, unspecified: Secondary | ICD-10-CM

## 2024-08-18 DIAGNOSIS — F902 Attention-deficit hyperactivity disorder, combined type: Secondary | ICD-10-CM

## 2024-08-18 MED ORDER — AMPHETAMINE-DEXTROAMPHETAMINE 10 MG PO TABS: 10.0000 mg | ORAL_TABLET | Freq: Three times a day (TID) | ORAL | 0 refills | Status: DC

## 2024-08-18 NOTE — Telephone Encounter (Signed)
 Pt called requesting a refill on her adderall 10 mg. She gets a 90 day supply. Pharmacy is [facility] on [address]

## 2024-08-18 NOTE — Progress Notes (Signed)
 Kidney function stable   Anemia a little worse.  Repeat cbcd 3-4 wks Cholesterol-work on it A1C(3 month average of sugars) is elevated.  This is considered PreDiabetes.  Work on diet-decrease sugars and starches and aim for 30 minutes of exercise 5 days/week to prevent progression to diabetes

## 2024-08-18 NOTE — Telephone Encounter (Signed)
 pended

## 2024-08-20 ENCOUNTER — Ambulatory Visit
Admission: RE | Admit: 2024-08-20 | Discharge: 2024-08-20 | Disposition: A | Discharge status: Home / Self Care | Source: Ambulatory Visit | Attending: Family Medicine | Admitting: Family Medicine

## 2024-08-20 DIAGNOSIS — K5730 Diverticulosis of large intestine without perforation or abscess without bleeding: Secondary | ICD-10-CM | POA: Diagnosis not present

## 2024-08-20 DIAGNOSIS — R1084 Generalized abdominal pain: Secondary | ICD-10-CM

## 2024-08-20 DIAGNOSIS — R198 Other specified symptoms and signs involving the digestive system and abdomen: Secondary | ICD-10-CM

## 2024-08-20 DIAGNOSIS — K439 Ventral hernia without obstruction or gangrene: Secondary | ICD-10-CM | POA: Diagnosis not present

## 2024-08-20 MED ORDER — IOPAMIDOL (ISOVUE-300) INJECTION 61%
75.0000 mL | Freq: Once | INTRAVENOUS | Status: AC | PRN
Start: 2024-08-20 — End: 2024-08-20
  Administered 2024-08-20: 75 mL via INTRAVENOUS

## 2024-08-23 NOTE — Progress Notes (Signed)
 CT ok except for a tiny vascular lesion-probably  a collection of blood vessels.  Will repeat in 6 months

## 2024-08-23 NOTE — Telephone Encounter (Signed)
 Spoke with patient about CT results and notes per PCP. Pt acknowledged understanding.

## 2024-08-23 NOTE — Telephone Encounter (Signed)
-----   Message from [name] sent at 08/23/2024 12:51 PM EDT ----- CT ok except for a tiny vascular lesion-probably  a collection of blood vessels.  Will repeat in 6 months ----- Message ----- From: Interface, Rad Results In Sent: 08/23/2024  11:45 AM EDT To: [name], MD

## 2024-08-25 ENCOUNTER — Other Ambulatory Visit: Payer: Self-pay | Admitting: Psychiatry

## 2024-08-25 DIAGNOSIS — F5105 Insomnia due to other mental disorder: Secondary | ICD-10-CM

## 2024-09-15 ENCOUNTER — Ambulatory Visit
Admission: EM | Admit: 2024-09-15 | Discharge: 2024-09-15 | Disposition: A | Discharge status: Home / Self Care | Source: Ambulatory Visit | Attending: Family Medicine | Admitting: Family Medicine

## 2024-09-15 ENCOUNTER — Other Ambulatory Visit (INDEPENDENT_AMBULATORY_CARE_PROVIDER_SITE_OTHER)

## 2024-09-15 ENCOUNTER — Other Ambulatory Visit: Payer: Self-pay

## 2024-09-15 ENCOUNTER — Ambulatory Visit: Payer: Self-pay | Admitting: Family Medicine

## 2024-09-15 DIAGNOSIS — D649 Anemia, unspecified: Secondary | ICD-10-CM

## 2024-09-15 DIAGNOSIS — M5442 Lumbago with sciatica, left side: Secondary | ICD-10-CM | POA: Diagnosis not present

## 2024-09-15 LAB — CBC WITH DIFFERENTIAL/PLATELET
Basophils Absolute: 0.1 K/uL (ref 0.0–0.1)
Basophils Relative: 0.9 % (ref 0.0–3.0)
Eosinophils Absolute: 0.3 K/uL (ref 0.0–0.7)
Eosinophils Relative: 4.4 % (ref 0.0–5.0)
HCT: 33.5 % — ABNORMAL LOW (ref 36.0–46.0)
Hemoglobin: 10.8 g/dL — ABNORMAL LOW (ref 12.0–15.0)
Lymphocytes Relative: 30.3 % (ref 12.0–46.0)
Lymphs Abs: 2.1 K/uL (ref 0.7–4.0)
MCHC: 32.3 g/dL (ref 30.0–36.0)
MCV: 91.9 fl (ref 78.0–100.0)
Monocytes Absolute: 0.6 K/uL (ref 0.1–1.0)
Monocytes Relative: 9.1 % (ref 3.0–12.0)
Neutro Abs: 3.8 K/uL (ref 1.4–7.7)
Neutrophils Relative %: 55.3 % (ref 43.0–77.0)
Platelets: 212 K/uL (ref 150.0–400.0)
RBC: 3.65 Mil/uL — ABNORMAL LOW (ref 3.87–5.11)
RDW: 14.4 % (ref 11.5–15.5)
WBC: 6.8 K/uL (ref 4.0–10.5)

## 2024-09-15 MED ORDER — CYCLOBENZAPRINE HCL 5 MG PO TABS
2.5000 mg | ORAL_TABLET | Freq: Two times a day (BID) | ORAL | 0 refills | Status: AC | PRN
Start: 2024-09-15 — End: ?

## 2024-09-15 MED ORDER — PREDNISONE 20 MG PO TABS: 20.0000 mg | ORAL_TABLET | Freq: Every day | ORAL | 0 refills | Status: AC

## 2024-09-15 NOTE — Telephone Encounter (Signed)
 FYI Only or Action Required?: FYI only for provider.  Patient was last seen in primary care on 08/17/2024 by Wendolyn Jenkins Jansky, MD.  Called Nurse Triage reporting Leg Pain.  Symptoms began several weeks ago.  Interventions attempted: OTC medications: Tylenol .  Symptoms are: unchanged.  Triage Disposition: See HCP Within 4 Hours (Or PCP Triage)  Patient/caregiver understands and will follow disposition?: Yes        Reason for Disposition  [1] SEVERE pain (e.g., excruciating, unable to do any normal activities) AND [2] not improved after 2 hours of pain medicine  Answer Assessment - Initial Assessment Questions This pt showed up at [facility] today. The front desk called nurse triage and gave pt the phone as no appt availability soon. This RN recommends pt is seen within 4 hours. Pt given address for urgent care at [employer]. Pt going there now.  Left leg pain Throbbing sensation Sometimes pt feels like it is coming from hip and it goes down to mid-calf Left knee pain  Onset: 2 weeks  Not sure what is causing it  Took Tylenol   Denies swelling, chest pain, difficultry breathing  Pain level: 8/10 pain level pretty much constant  Protocols used: Leg Pain-A-AH

## 2024-09-15 NOTE — ED Triage Notes (Signed)
 Pt c/o left lower back pain that radiates down left kneex2wks. PT denies numbness or tingling. Pt denies injury.

## 2024-09-15 NOTE — ED Provider Notes (Signed)
 [facility]    CSN: [id number] Arrival date & time: 09/15/24  1143      History   Chief Complaint Chief Complaint  Patient presents with   Back Pain    HPI Gloria Fields is a 78 y.o. female presents for back pain.  Patient reports 2 weeks of an intermittent left lower back pain that radiates down to her hip/outer thigh and outer knee.  No numbness/tingling/weakness of her lower extremities, no bowel or bladder incontinence, no saddle paresthesia.  No injury or known inciting event.  No history of back surgeries or fractures.  She has been taking Tylenol  with minimal improvement.  No other concerns at this time   Back Pain   Past Medical History:  Diagnosis Date   ADD (attention deficit disorder)    Allergy    Anemia, normocytic normochromic 12/25/2012   Mild, chronic, Hb 11-12 grams at least since 11/2006; screening studies & endoscopies unremarkable   Anxiety    Back pain    Breast cancer (HCC) 2007   Chronic kidney disease    hx of stage 3 kidney disease    Depression    Diverticulosis of colon (without mention of hemorrhage)    Esophageal reflux    H/O dehydration    Headache    PMH   Heart murmur    History of blood transfusion    History of radiation therapy    Hypercholesterolemia    Hypertension    Irritable bowel syndrome    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   Osteopenia    Ovarian cystic mass 07/07/2015   Personal history of radiation therapy 2008   PONV (postoperative nausea and vomiting)    also had severe itching    Pyloric stenosis    Repeated falls    Shortness of breath dyspnea    walking distances or climbing stairs    Patient Active Problem List   Diagnosis Date Noted   Vitamin D  deficiency 04/08/2023   Other hyperlipidemia 04/08/2023   Primary hypertension 11/05/2022   Gait abnormality 11/05/2022   Polyarthralgia 11/05/2022   Anxiety state 09/21/2018   Depression 09/21/2018    ADD (attention deficit disorder) 09/21/2018   Ventral hernia 10/12/2015   Endometrial stripe increased 07/28/2015   Ovarian cancer screening 07/19/2015   Ovarian cystic mass - left 07/07/2015   Colon cancer screening    Stenosis of colon (HCC)    Diverticulosis of colon without hemorrhage    Pyloric stenosis, acquired    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   Anemia, normocytic normochromic 12/25/2012   Nausea with vomiting 08/28/2011   Esophageal reflux 08/28/2011   Pyloric channel ulcer 08/28/2011   GERD (gastroesophageal reflux disease) 08/15/2011   Depression, major, recurrent 08/15/2011   Anxiety disorder 08/15/2011   Diverticulosis of colon 08/15/2011    Past Surgical History:  Procedure Laterality Date   APPENDECTOMY  2007   perforated appendicitis   BREAST LUMPECTOMY  2007   right   CATARACT EXTRACTION     COLONOSCOPY     COLONOSCOPY WITH PROPOFOL  N/A 07/07/2015   Procedure: COLONOSCOPY WITH PROPOFOL ;  Surgeon: Lupita FORBES Commander, MD;  Location: [facility] END[facility]COPY;  Service: Endoscopy;  Laterality: N/A;  NEED ULTRA SLIM COLONOSCOPE PLEASE, possible pyloric dilation   ESOPHAGOGASTRODUODENOSCOPY     ESOPHAGOGASTRODUODENOSCOPY N/A 07/07/2015   Procedure: ESOPHAGOGASTRODUODENOSCOPY (EGD);  Surgeon: Lupita FORBES Commander, MD;  Location: [facility] ENDOSCOPY;  Service: Endoscopy;  Laterality: N/A;  POSSIBLE PYLORIC DILATION   HERNIA REPAIR Left    L and ventral   LAPAROSCOPIC BILATERAL SALPINGO OOPHERECTOMY Bilateral 10/12/2015   Procedure: LAPAROSCOPIC  LEFT SALPINGO OOPHORECTOMY AND LYSIS OF ADHESIONS ;  Surgeon: Maurilio Ship, MD;  Location: [facility] ORS;  Service: Gynecology;  Laterality: Bilateral;   LAPAROSCOPIC LYSIS OF ADHESIONS  10/29/2012   Procedure: LAPAROSCOPIC LYSIS OF ADHESIONS;  Surgeon: Donnice KATHEE Lunger, MD;  Location: [facility] ORS;  Service: General;;   UPPER GASTROINTESTINAL ENDOSCOPY  Multiple   UTERINE FIBROID SURGERY     with subsequent scar tissue  removal   VENTRAL HERNIA REPAIR  10/29/2012   Procedure: LAPAROSCOPIC VENTRAL HERNIA;  Surgeon: Donnice KATHEE Lunger, MD;  Location: [facility];  Service: General;  Laterality: N/A;  LAPRASCOPIC  ASSISTED OPEN VENTRAL HERNIA REPAIR, LAPRASCOPIC LYSIS OF ADHESIONS    VENTRAL HERNIA REPAIR N/A 10/12/2015   Procedure: LAPAROSCOPIC VENTRAL HERNIA REPAIR ;  Surgeon: Donnice Lunger, MD;  Location: [facility];  Service: General;  Laterality: N/A;   WISDOM TOOTH EXTRACTION      OB History   No obstetric history on file.      Home Medications    Prior to Admission medications   Medication Sig Start Date End Date Taking? Authorizing Provider  cyclobenzaprine (FLEXERIL) 5 MG tablet Take 0.5 tablets (2.5 mg total) by mouth 3 times/day as needed-between meals & bedtime for muscle spasms. 09/15/24  Yes Roxanne Panek, Jodi R, NP  predniSONE (DELTASONE) 20 MG tablet Take 1 tablet (20 mg total) by mouth daily with breakfast for 5 days. 09/15/24 09/20/24 Yes Leoma Folds, Jodi R, NP  amphetamine -dextroamphetamine  (ADDERALL) 10 MG tablet Take 1 tablet (10 mg total) by mouth 3 (three) times daily. 08/18/24   Cottle, Lorene KANDICE Raddle., MD  antiseptic oral rinse (BIOTENE) LIQD 15 mLs by Mouth Rinse route as needed for dry mouth (uses when she brushes her teeth).    [provider]  busPIRone  (BUSPAR ) 30 MG tablet Take 1 tablet (30 mg total) by mouth 2 (two) times daily. 04/20/24   Cottle, Lorene KANDICE Raddle., MD  clonazePAM  (KLONOPIN ) 0.5 MG tablet Take 1 tablet (0.5 mg total) by mouth 3 (three) times daily as needed for anxiety. 04/20/24   Cottle, Lorene KANDICE Raddle., MD  Omega-3 Fatty Acids (FISH OIL) 1000 MG CAPS Take 1,000 mg by mouth 2 (two) times daily. Patient not taking: Reported on 08/17/2024 06/19/16   [provider]  omeprazole  (PRILOSEC) 40 MG capsule Take 1 capsule (40 mg total) by mouth daily. 08/17/24   Wendolyn Jenkins Jansky, MD  PREVIDENT  5000 DRY MOUTH 1.1 % GEL dental gel  10/27/15   [provider]  sertraline  (ZOLOFT ) 100  MG tablet Take 2 tablets (200 mg total) by mouth daily. 04/20/24   Cottle, Lorene KANDICE Raddle., MD  traZODone  (DESYREL ) 150 MG tablet TAKE 1-2 TABLETS (150-300 MG TOTAL) BY MOUTH DAILY. 08/25/24   Cottle, Lorene KANDICE Raddle., MD  valsartan  (DIOVAN ) 80 MG tablet Take 1 tablet (80 mg total) by mouth daily. 08/17/24   Wendolyn Jenkins Jansky, MD  VITAMIN D  PO Take by mouth.    [provider]    Family History Family History  Problem Relation Age of Onset   Miscarriages / [place] Mother    Hearing loss Father    Arthritis Father    Other Father        colon rupture   Diabetes Maternal Aunt    Breast cancer Maternal Aunt    Breast cancer Maternal Aunt  X 3   Breast cancer Maternal Aunt    Cancer Maternal Grandfather    Prostate cancer Maternal Grandfather    Arthritis Paternal Grandmother    Early death Paternal Grandfather    Drug abuse Paternal Grandfather    Pancreatic cancer Cousin     Social History Social History   Tobacco Use   Smoking status: Never   Smokeless tobacco: Never  Vaping Use   Vaping status: Never Used  Substance Use Topics   Alcohol  use: Yes    Alcohol /week: 0.0 standard drinks of alcohol     Comment: rarely   Drug use: No     Allergies   Morphine   Review of Systems Review of Systems  Musculoskeletal:  Positive for back pain.     Physical Exam Triage Vital Signs ED Triage Vitals  Encounter Vitals Group     BP 09/15/24 1210 (!) 154/80     Girls Systolic BP Percentile --      Girls Diastolic BP Percentile --      Boys Systolic BP Percentile --      Boys Diastolic BP Percentile --      Pulse Rate 09/15/24 1210 90     Resp 09/15/24 1210 17     Temp 09/15/24 1210 98.3 F (36.8 C)     Temp Source 09/15/24 1210 Oral     SpO2 09/15/24 1210 92 %     Weight --      Height --      Head Circumference --      Peak Flow --      Pain Score 09/15/24 1207 8     Pain Loc --      Pain Education --      Exclude from Growth Chart --    No data  found.  Updated Vital Signs BP (!) 154/80   Pulse 90   Temp 98.3 F (36.8 C) (Oral)   Resp 17   SpO2 92%   Visual Acuity Right Eye Distance:   Left Eye Distance:   Bilateral Distance:    Right Eye Near:   Left Eye Near:    Bilateral Near:     Physical Exam Vitals and nursing note reviewed.  Constitutional:      General: She is not in acute distress.    Appearance: Normal appearance. She is not ill-appearing.  HENT:     Head: Normocephalic and atraumatic.  Eyes:     Pupils: Pupils are equal, round, and reactive to light.  Cardiovascular:     Rate and Rhythm: Normal rate.  Pulmonary:     Effort: Pulmonary effort is normal.  Musculoskeletal:     Thoracic back: Normal.     Lumbar back: Tenderness present. No swelling, edema, deformity, signs of trauma, lacerations, spasms or bony tenderness. Normal range of motion. Positive left straight leg raise test. Negative right straight leg raise test. No scoliosis.       Back:     Comments: Strength is 5 out of 5 bilateral lower extremities  Skin:    General: Skin is warm and dry.  Neurological:     General: No focal deficit present.     Mental Status: She is alert and oriented to person, place, and time.  Psychiatric:        Mood and Affect: Mood normal.        Behavior: Behavior normal.      UC Treatments / Results  Labs (all labs ordered are listed, but only  abnormal results are displayed) Labs Reviewed - No data to display  Comprehensive metabolic panel with GFR Order: [id number]  Status: Final result     Next appt: 02/14/2025 at 11:00 AM in Family Medicine Gerard CHRISTELLA Carrel, MD)     Dx: Generalized abdominal pain; Abnormal ...   Test Result Released: Yes (not seen)     Messages: Not Seen   2 Result Notes     1 Patient Communication     View Follow-Up Encounter          Component Ref Range & Units (hover) 4 wk ago (08/17/24) 7 mo ago (02/10/24) 1 yr ago (07/11/23) 1 yr ago (06/06/23) 1 yr ago (04/08/23) 1 yr  ago (11/05/22) 7 yr ago (01/15/17)  Sodium 141 138 135 141 141 140 142 R  Potassium 3.9 3.6 3.9 4.0 4.0 3.9 4.2  Chloride 106 101 104 105 107 104 108 R  CO2 26 26 23 27 27 26 25  R  Glucose, Bld 109 High  114 High  113 High  125 High  104 High  101 High  100 R, CM  BUN 21 23 27  High  27 High  17 18 22.3 R  Creatinine, Ser 1.33 High  1.46 High  1.40 High  1.51 High  1.37 High  1.05 1.3 High  R  Total Bilirubin 0.3 0.3   0.4  0.27 R  Alkaline Phosphatase 81 64   57  65 R  AST 17 15   15  14  R  ALT 12 9   11  10  R  Total Protein 7.0 6.9   6.3  6.6 R  Albumin 4.2 4.3   3.9  3.6 R  GFR 38.74 Low  34.77 Low  CM 36.72 Low  CM 33.55 Low  CM 37.75 Low  CM 52.10 Low  CM   Comment: Calculated using the CKD-EPI Creatinine Equation (2021)  Calcium  9.3 9.6 9.6 9.5 8.8 9.0 9.3 R  Resulting Agency [place] [facility] [place] [facility] [place] [facility] [place] [facility] [place] [facility] [place] [facility]        Specimen Collected: 08/17/24 12:19 Last Resulted: 08/17/24 15:27    EKG   Radiology No results found.  Procedures Procedures (including critical care time)  Medications Ordered in UC Medications - No data to display  Initial Impression / Assessment and Plan / UC Course  I have reviewed the triage vital signs and the nursing notes.  Pertinent labs & imaging results that were available during my care of the patient were reviewed by me and considered in my medical decision making (see chart for details).     Reviewed exam and symptoms with patient.  No red flags.  Discussed low back pain/sciatica.  No NSAIDs due to chronic kidney disease, will do trial of prednisone daily for 5 days as well as low-dose Flexeril nightly as needed, side effect profile reviewed.  Instructed heat to low back and rest.  PCP follow-up if symptoms do not improve.  ER precautions reviewed Final Clinical Impressions(s) / UC Diagnoses   Final diagnoses:  Acute left-sided low back pain with left-sided  sciatica     Discharge Instructions      Start prednisone daily for 5 days.  You may take Flexeril half a tablet at night.  Please note this medication will make you drowsy.  Do not drink alcohol  or drive while on this medication.  Heat to the low back and lots of rest.  Please follow-up with your PCP if  your symptoms do not improve.  Please go to the ER if you develop any worsening symptoms.  Hope you feel better soon!     ED Prescriptions     Medication Sig Dispense Auth. Provider   cyclobenzaprine (FLEXERIL) 5 MG tablet Take 0.5 tablets (2.5 mg total) by mouth 3 times/day as needed-between meals & bedtime for muscle spasms. 4 tablet Alonso Gapinski, Jodi R, NP   predniSONE (DELTASONE) 20 MG tablet Take 1 tablet (20 mg total) by mouth daily with breakfast for 5 days. 5 tablet Nikesha Kwasny, Jodi R, NP      PDMP not reviewed this encounter.   Loreda Myla SAUNDERS, NP 09/15/24 [phone]

## 2024-09-15 NOTE — Telephone Encounter (Signed)
 See triage note, pt agreeable to be seen at [facility] this morning.

## 2024-09-15 NOTE — Discharge Instructions (Addendum)
 Start prednisone daily for 5 days.  You may take Flexeril half a tablet at night.  Please note this medication will make you drowsy.  Do not drink alcohol  or drive while on this medication.  Heat to the low back and lots of rest.  Please follow-up with your PCP if your symptoms do not improve.  Please go to the ER if you develop any worsening symptoms.  Hope you feel better soon!

## 2024-09-16 ENCOUNTER — Ambulatory Visit: Payer: Self-pay | Admitting: Family Medicine

## 2024-09-16 NOTE — Progress Notes (Signed)
 Anemia some better.  Has she ever seen GI or hematology for this?  If not, please refer

## 2024-09-17 NOTE — Telephone Encounter (Signed)
 Left VM for patient to return call about recent lab results and notes per Dr Wendolyn.

## 2024-09-17 NOTE — Telephone Encounter (Signed)
-----   Message from [name] sent at 09/16/2024  7:34 PM EDT ----- Anemia some better.  Has she ever seen GI or hematology for this?  If not, please refer ----- Message ----- From: Interface, Lab In Three Zero One Sent: 09/15/2024   2:36 PM EDT To: [name], MD

## 2024-10-16 ENCOUNTER — Other Ambulatory Visit: Payer: Self-pay | Admitting: Psychiatry

## 2024-10-16 DIAGNOSIS — F411 Generalized anxiety disorder: Secondary | ICD-10-CM

## 2024-11-15 NOTE — Telephone Encounter (Signed)
 Auth obtained  test coompleted

## 2024-12-02 ENCOUNTER — Telehealth: Payer: Self-pay | Admitting: Psychiatry

## 2024-12-02 ENCOUNTER — Other Ambulatory Visit: Payer: Self-pay

## 2024-12-02 DIAGNOSIS — F4001 Agoraphobia with panic disorder: Secondary | ICD-10-CM

## 2024-12-02 DIAGNOSIS — F902 Attention-deficit hyperactivity disorder, combined type: Secondary | ICD-10-CM

## 2024-12-02 NOTE — Telephone Encounter (Signed)
 Pt called at 1/21 requesting refill of Clonazepam  and Adderall to   [facility] [id number] [place], [place] - [address] [place] [address] Phone: [phone]  Fax: [phone]    Next appt 3/2

## 2024-12-02 NOTE — Telephone Encounter (Signed)
 Last refill 9/25  Pended for Dr. Geoffry

## 2024-12-03 MED ORDER — AMPHETAMINE-DEXTROAMPHETAMINE 10 MG PO TABS: 10.0000 mg | ORAL_TABLET | Freq: Three times a day (TID) | ORAL | 0 refills | Status: AC

## 2024-12-03 MED ORDER — CLONAZEPAM 0.5 MG PO TABS: 0.5000 mg | ORAL_TABLET | Freq: Three times a day (TID) | ORAL | 2 refills | Status: AC | PRN

## 2025-01-24 ENCOUNTER — Ambulatory Visit: Admitting: Psychiatry

## 2025-02-14 ENCOUNTER — Ambulatory Visit: Admitting: Family Medicine

## 2025-02-15 ENCOUNTER — Ambulatory Visit: Admitting: Family Medicine

## 2025-04-26 ENCOUNTER — Ambulatory Visit
